# Patient Record
Sex: Female | Born: 1960 | Race: White | Hispanic: No | Smoking: Never smoker
Health system: Southern US, Community
[De-identification: ages and names within clinical notes are randomized; demographics above are authoritative.]

## PROBLEM LIST (undated history)

## (undated) DIAGNOSIS — Z9289 Personal history of other medical treatment: Secondary | ICD-10-CM

## (undated) DIAGNOSIS — Z5189 Encounter for other specified aftercare: Secondary | ICD-10-CM

## (undated) DIAGNOSIS — M1990 Unspecified osteoarthritis, unspecified site: Secondary | ICD-10-CM

## (undated) DIAGNOSIS — K219 Gastro-esophageal reflux disease without esophagitis: Secondary | ICD-10-CM

## (undated) DIAGNOSIS — M549 Dorsalgia, unspecified: Secondary | ICD-10-CM

## (undated) DIAGNOSIS — R05 Cough: Secondary | ICD-10-CM

## (undated) HISTORY — DX: Unspecified osteoarthritis, unspecified site: M19.90

## (undated) HISTORY — DX: Personal history of other medical treatment: Z92.89

## (undated) HISTORY — DX: Gastro-esophageal reflux disease without esophagitis: K21.9

## (undated) HISTORY — DX: Cough: R05

## (undated) HISTORY — DX: Encounter for other specified aftercare: Z51.89

---

## 2001-05-17 ENCOUNTER — Encounter: Admission: RE | Admit: 2001-05-17 | Discharge: 2001-05-17 | Payer: Self-pay | Admitting: Neurological Surgery

## 2001-05-17 ENCOUNTER — Encounter: Payer: Self-pay | Admitting: Neurological Surgery

## 2001-06-04 ENCOUNTER — Ambulatory Visit (HOSPITAL_COMMUNITY): Admission: RE | Admit: 2001-06-04 | Discharge: 2001-06-04 | Payer: Self-pay | Admitting: Neurological Surgery

## 2001-06-04 ENCOUNTER — Encounter: Payer: Self-pay | Admitting: Neurological Surgery

## 2002-06-27 HISTORY — PX: ABDOMINAL HYSTERECTOMY: SHX81

## 2002-09-16 ENCOUNTER — Inpatient Hospital Stay (HOSPITAL_COMMUNITY): Admission: RE | Admit: 2002-09-16 | Discharge: 2002-09-19 | Payer: Self-pay | Admitting: Obstetrics and Gynecology

## 2002-09-24 ENCOUNTER — Ambulatory Visit (HOSPITAL_COMMUNITY): Admission: RE | Admit: 2002-09-24 | Discharge: 2002-09-24 | Payer: Self-pay | Admitting: Obstetrics and Gynecology

## 2002-09-24 ENCOUNTER — Encounter: Payer: Self-pay | Admitting: Obstetrics and Gynecology

## 2002-09-26 ENCOUNTER — Ambulatory Visit (HOSPITAL_COMMUNITY): Admission: RE | Admit: 2002-09-26 | Discharge: 2002-09-26 | Payer: Self-pay | Admitting: Obstetrics and Gynecology

## 2005-06-30 ENCOUNTER — Ambulatory Visit: Payer: Self-pay | Admitting: Family Medicine

## 2006-05-13 ENCOUNTER — Encounter: Admission: RE | Admit: 2006-05-13 | Discharge: 2006-05-13 | Payer: Self-pay | Admitting: Neurological Surgery

## 2007-01-21 ENCOUNTER — Encounter: Admission: RE | Admit: 2007-01-21 | Discharge: 2007-01-21 | Payer: Self-pay | Admitting: Orthopaedic Surgery

## 2007-05-16 ENCOUNTER — Encounter: Admission: RE | Admit: 2007-05-16 | Discharge: 2007-05-16 | Payer: Self-pay | Admitting: Orthopaedic Surgery

## 2007-06-18 ENCOUNTER — Encounter: Admission: RE | Admit: 2007-06-18 | Discharge: 2007-06-18 | Payer: Self-pay | Admitting: Orthopaedic Surgery

## 2007-07-11 ENCOUNTER — Encounter: Admission: RE | Admit: 2007-07-11 | Discharge: 2007-07-11 | Payer: Self-pay | Admitting: Orthopaedic Surgery

## 2007-11-08 ENCOUNTER — Encounter: Admission: RE | Admit: 2007-11-08 | Discharge: 2007-11-08 | Payer: Self-pay | Admitting: Orthopaedic Surgery

## 2007-12-10 ENCOUNTER — Encounter: Admission: RE | Admit: 2007-12-10 | Discharge: 2007-12-10 | Payer: Self-pay | Admitting: Orthopaedic Surgery

## 2008-03-12 ENCOUNTER — Encounter: Admission: RE | Admit: 2008-03-12 | Discharge: 2008-03-12 | Payer: Self-pay | Admitting: Orthopaedic Surgery

## 2008-05-16 ENCOUNTER — Encounter: Admission: RE | Admit: 2008-05-16 | Discharge: 2008-05-16 | Payer: Self-pay | Admitting: Orthopaedic Surgery

## 2008-06-27 HISTORY — PX: BACK SURGERY: SHX140

## 2008-06-27 HISTORY — PX: SPINE SURGERY: SHX786

## 2008-08-07 ENCOUNTER — Encounter: Admission: RE | Admit: 2008-08-07 | Discharge: 2008-08-07 | Payer: Self-pay | Admitting: Orthopaedic Surgery

## 2008-11-12 ENCOUNTER — Encounter: Admission: RE | Admit: 2008-11-12 | Discharge: 2008-11-12 | Payer: Self-pay | Admitting: Orthopaedic Surgery

## 2010-03-15 ENCOUNTER — Encounter: Admission: RE | Admit: 2010-03-15 | Discharge: 2010-03-15 | Payer: Self-pay | Admitting: Orthopaedic Surgery

## 2010-05-23 ENCOUNTER — Emergency Department (HOSPITAL_COMMUNITY): Admission: EM | Admit: 2010-05-23 | Discharge: 2010-05-23 | Payer: Self-pay | Admitting: Emergency Medicine

## 2010-11-12 NOTE — H&P (Signed)
[name], [name]                            ACCOUNT NO.:  [id number]   MEDICAL RECORD NO.:  [id number]                   PATIENT TYPE:  AMB   LOCATION:  DAY                                  FACILITY:  [facility]   PHYSICIAN:  Tilda Burrow, M.D.              DATE OF BIRTH:  April 16, 1961   DATE OF ADMISSION:  DATE OF DISCHARGE:                                HISTORY & PHYSICAL   ADMITTING DIAGNOSES:  1. Dysmenorrhea, right lower quadrant __________.  2. Dyspareunia right lower quadrant.   HISTORY OF PRESENT ILLNESS:  This 50 year old female gravida 3, para 2, AB  1, status post tubal ligation in 1985 with prior history of laparoscopy of  right ovarian cyst in 1983 with subsequent laparoscopy in 1989 for corpus  luteum cyst, aspiration; both occurring on the right side, both performed by  Dr. Kathie Rhodes. [name] prior to my initial contact with the patient.  She is  admitted at this time for a total abdominal hysterectomy and evaluation of  the left tube and ovary.  [name] has been well-known to our practice since  1990.  She had evaluation then for recurrence of a similar sort of right  lower quadrant pain for which Dr. Lottie Dawson had recommended the hysterectomy and  right salpingo-oophorectomy but the patient was psychologically unprepared  for hysterectomy at that time. She underwent a right salpingo-oophorectomy  and appendectomy in 1990 at this facility. This was performed and then she  had a GI workup which was negative by Dr. Karilyn Cota.  Since that time she has  had intermittent GYN care.  Menses were originally lasting 6-8 days total  with only 1 day heavy.  She was managed on birth control pills for a period  of time.   She re-established care in our office in 2003 with complaints of bad cramps  between her periods and discomfort. Evaluation, at that point in time,  showed a mobile anterior uterus.  No masses on the right side. She was  treated with birth control pills to reduce her  periods.   On return to our office 07/10/2002 she was complaining of a similar right  lower quadrant pain which was almost daily in nature, not relieved by Advil.  It was felt mostly in the front with radiation to the back.  The last few  months the pain has been much more significant particularly with her menses.  The patient is very specific that the pain is worse than menses and is very  similar to the aggravating pain that she had back in 1989.  I have been very  clear to [facility], since contact on the uterus seems to reproduce the pain,  that the likelihood that hysterectomy will assist her is significant, but  that absolute reassure can not be given.  Repeat ultrasound has been  performed which shows a uterus measuring 9.7 cm in length x  3.8 x 5.5 cm in  diameter.  The adnexa show normal size left adnexa with right ovary not  visible naturally.   MEDICAL HISTORY:  The patient on disability due to her back discomfort which  she attributes to scoliosis and degenerative joint disease.  She is managed  currently by Dr. __________ for these.  She has a history of scoliosis.   PHYSICAL EXAMINATION:  VITAL SIGNS:  Height 5 feet 7-1/2 inches.  Weight 160-  1/2 .  Blood pressure 125/65.  GENERAL:  A healthy appearing Caucasian female, alert, and oriented x3.  HEENT:  Pupils are equal, round, and reactive.  NECK:  Supple.  Trachea midline.  ABDOMEN:  Discomfort in the right lower quadrant at the end of a  Pfannenstiel-type incision.  PELVIC:  External genitalia is multiparous.  Vaginal exam shows normal  secretions.  Cervix is very elongate sticking well into the vagina and  somewhat tender to manipulation.  The uterus is elongate, easily palpable on  bimanual exam by patient above the symphysis pubis.  Adnexa negative for  masses, but the tenderness of the uterus seems to be the predominate cause  of pain.   LABORATORY DATA:  Urinalysis negative except for microscopic blood.    IMPRESSION:  1. Dysmenorrhea.  2. Dyspareunia, of a chronic nature, not responding to previous right     salpingo-oophorectomy and appendectomy.   PLAN:  Abdominal hysterectomy.  The patient wishes specifically for efforts  to be made to preserve left ovary to avoid hormone therapy.  She has been  very explicit and comments that there is absolutely no pain on the left  side.  She has been made very clear that while the likelihood of improvement  and relief is great, that there is absolutely no guarantee can be given.  Plan abdominal hysterectomy through old Pfannenstiel incision with excision  of cicatrix on 09/16/2002.   ADDENDUM:  Pap smears have most recently shown atypical reactive squamous  cells, but no suspicion of dysplasia.  GC and Chlamydia are negative.                                               Tilda Burrow, M.D.    JVF/MEDQ  D:  09/13/2002  T:  09/13/2002  Job:  [id number]   cc:   Dr. [facility]

## 2010-11-12 NOTE — Op Note (Signed)
NAMESAMAH, LAPIANA                            ACCOUNT NO.:  [id number]   MEDICAL RECORD NO.:  [id number]                   PATIENT TYPE:  AMB   LOCATION:  DAY                                  FACILITY:  [facility]   PHYSICIAN:  Tilda Burrow, M.D.              DATE OF BIRTH:  1960-12-08   DATE OF PROCEDURE:  09/16/2002  DATE OF DISCHARGE:                                 OPERATIVE REPORT   PREOPERATIVE DIAGNOSIS:  Dysmenorrhea, dyspareunia, right lower quadrant  pain.   POSTOPERATIVE DIAGNOSIS:  Dysmenorrhea, dyspareunia, right lower quadrant  pain.   PROCEDURE:  Abdominal hysterectomy.   SURGEON:  Tilda Burrow, M.D.   ASSISTANT:  Amie Critchley, CST and [place], RN   ANESTHESIA:  General   COMPLICATIONS:  None   FINDINGS:  A normal appearing uterus, large cervix 4 cm transverse diameter,  normal appearing left ovary without adhesions, evidence of recent ovulation  on left ovary, well-healed right adnexa with some tension in the area of the  right broad ligament that may have contributed to her persistent right lower  quadrant pain.  Evidence of prior appendectomy.   DETAILS OF PROCEDURE:  The patient was taken to the operating room and  prepped and draped for lower abdominal surgery.  A Pfannenstiel incision was  repeated with wide excision of the skin and cicatrix down to the level of  the fascia which was opened in standard Pfannenstiel technique.  Rectus  muscles were split in the midline, peritoneal cavity entered carefully and  bluntly; and bowel packed away.   Attention was directed to the uterus where the round ligament was clamped,  cut, and suture ligated with #0 chromic and the remnants of the right broad  ligament which were quite dense and tight in the upper aspects of the broad  ligament were clamped, cut, and suture ligated. The left side had easy  identification of the uteroovarian ligaments were isolated, clamped, cut,  and suture ligated.  The uterine  vessels were skeletonized on either side  and then a curved Heaney clamp was used to cross-clamp them and then #0  chromic suture ligature used.   The upper and lower cardinal ligaments were taken down serially in small  bites.  Once we were sure we were below the cervix we made an anterior stab  incision which was approximately 1 cm down from the cervical edge.  The  cervix was then amputated off the of the vaginal cuff and 4 Kocher clamps  used to maintain orientation.  An Aldrich stitch was placed at each lateral  vaginal angle to achieve adequate hemostasis.   A second figure-of-eight suture was required on the left side to improve  hemostasis.  The pelvic support was such that a sagittal closure of the cuff  was not considered desirable because it might be too tense.  Therefore, we  made a modified cuff closure.  After placement of the Aldrich stitches, as  described earlier, we closed the posterior cuff with a figure-of-eight  stitch, as well as the anterior cuff with a figure-of-eight.  The sides of  the cuff were then closed with figure-of-eight sutures on either side,  resulting in a somewhat cross-shaped cuff closure.  A final figure-of-eight  suture in the midline completed closure of the cuff.  Hemostasis was  adequate and support was considered good.   Irrigation of the pelvis was performed and then some point cautery was used  as necessary for individual bleeders.  An addition figure-of-eight suture  was placed on the anterior vaginal cuff to complete hemostasis.  The  peritoneum was reapproximated with 3 interrupted sutures of 2-0 chromic to  the poled peritoneal edges into a closure proximity and then laparotomy  equipment was removed.  Laparotomy tapes were removed and counts correct.  Anterior peritoneum closed using a running 2-0 chromic, the fascial  insertion of the rectus muscles pulled together with interrupted 2-0 chromic  and then the fascia itself was closed  with #0 Vicryl.  The subcuticular  tissues were irrigated with saline solution, confirmed as hemostatic,  reapproximated with 3 interrupted sutures of 2-0 plain and then staple  closure of the skin completed the procedure.  The patient tolerated the  procedure well.   ADDENDUM:  Prior to putting the patient asleep we were very careful to place  the patient in the position that she would be in during the surgery with  legs slightly flexed over pillows due to her history of scoliosis and  chronic back pain. The patient was able to confirm comfort of her position  prior to being put to sleep.                                               Tilda Burrow, M.D.    JVF/MEDQ  D:  09/16/2002  T:  09/16/2002  Job:  [id number]

## 2010-11-12 NOTE — Discharge Summary (Signed)
[name], [name]                            ACCOUNT NO.:  [id number]   MEDICAL RECORD NO.:  [id number]                   PATIENT TYPE:  INP   LOCATION:  A427                                 FACILITY:  [facility]   PHYSICIAN:  Tilda Burrow, M.D.              DATE OF BIRTH:  1960-11-16   DATE OF ADMISSION:  09/16/2002  DATE OF DISCHARGE:  09/19/2002                                 DISCHARGE SUMMARY   PROCEDURE:  Total abdominal hysterectomy which was performed on 09/16/02.   DISCHARGE MEDICATIONS:  1. Motrin 800 mg one p.o. q.8h. x2 weeks.  2. Vicodin 5 30 tablets 1-2 q.4h. p.r.n. severe pain.   FOLLOWUP:  Follow up in six days for staple removal.   HISTORY OF PRESENT ILLNESS:  This 50 year old female gravida 3, para 2, ab 1  status post tubal ligation with history of laparoscopic right ovarian  cystectomy in 1983 with subsequent repeat right ovarian cystectomy in 1989  was admitted at this time for abdominal hysterectomy.  Persistent right  lower quadrant pain has been a continued problem.  She had no history of  endometriosis.  Plans were to preserve the left tube and ovary.   HOSPITAL COURSE:  The patient underwent total abdominal hysterectomy through  the old Pfannenstiel type incision on 09/15/01.  The uterus was quite small.  There was some thickening no acute distress fibrosis of the broad ligament  on the patient's right, but no specific focal abnormalities, and  specifically no endometriosis.  I think it was notable in that there was  tendency to ooze from the cuff, but we offered careful attention until we  were satisfied with the results.  The cuff was closed in a modified cuff  closure which was a cross shaped closure which resulted in adequate pelvic  support without excess tightening.  The ovary on the left was left in place  and attached to the round ligament remnants to keep it out of the pelvis.   The patient tolerated the postoperative course satisfactorily  except that  she had slight increased oozing into the incision.  CBC on day of discharge  showed a white count of 6,500, hemoglobin 11.1, hematocrit 32.6 compared to  preop values of 13.9 and 40.7.  This was more than expected for the 100 cc  postoperative blood loss.  The incision showed both some puffiness and some  bruising suggesting oozing into the incision as well as the one day of extra  slow valve return suggesting some oozing intra-abdominally as well.  The  patient was considered tolerating this well and went home in stable  condition for follow up as six days.  Routine postsurgical instructions  given including no driving x2 weeks, no intimacy x6 weeks or until released  with routine postsurgical instructions reviewed.  Tilda Burrow, M.D.    JVF/MEDQ  D:  09/19/2002  T:  09/19/2002  Job:  [id number]

## 2010-11-12 NOTE — Op Note (Signed)
[name], [name]                            ACCOUNT NO.:  [id number]   MEDICAL RECORD NO.:  [id number]                   PATIENT TYPE:  AMB   LOCATION:  DAY                                  FACILITY:  [facility]   PHYSICIAN:  Tilda Burrow, M.D.              DATE OF BIRTH:  Sep 07, 1960   DATE OF PROCEDURE:  09/26/2002  DATE OF DISCHARGE:                                 OPERATIVE REPORT   PREOPERATIVE DIAGNOSIS:  Postoperative wound hematoma.   POSTOPERATIVE DIAGNOSIS:  Postoperative wound hematoma.   PROCEDURES:  1. Delayed closure of wound hematoma.  2. Placement of subcutaneous flat Jackson-Pratt drain.   SURGEON:  Tilda Burrow, M.D.   ANESTHESIA:  General.   COMPLICATIONS:  None.   FINDINGS:  A healthy-appearing incision bed, with minimal debriding to be  performed.  No evidence of infection.   INDICATIONS:  A 50 year old female 10 days status post hysterectomy  complicated by wound hematoma, opened two days ago in the office with  drainage of a subcutaneous hematoma and CT revealing that there was no  evidence of subfascial or intrapelvic hematoma.  The patient was unable to  tolerate dressing changes in the office sufficiently well to allow closure  in the office and taken to the OR for wound closure after hematoma drainage  resolved.   DESCRIPTION OF PROCEDURE:  The wound dressing was removed and the abdomen  and wound itself prepped and draped after thorough Betadine scrub.  The  patient received IV antibiotics x1 dose prophylaxis.  She had been on oral  Cipro 250 mg b.i.d. x2 days.  The wound bed was inspected carefully and any  area of suspected suboptimal vascularized fatty tissue was trimmed back to a  fresh bed.  There was no major bleeding.  Bovie cautery was not used.  Irrigation in the wound with antibiotic-containing solution, 1 g Ancef in 1  L saline was then performed with then a continuous running two-layer  subcutaneous closure with 2-0 plain with  a flat JP drain placed just above  the fascia.  The JP drain was allowed to exit through a separate stab  incision at the left corner of the incision.  The JP drain was sutured in  place with silk suture.  Staple closure of the skin incision resulted in  good tissue approximation.  The patient was then went to the recovery room  in stable condition.  She will be given an additional seven days of Cipro  and analgesics as needed.                                               Tilda Burrow, M.D.    JVF/MEDQ  D:  09/26/2002  T:  09/27/2002  Job:  [id number]

## 2010-11-12 NOTE — Discharge Summary (Signed)
   [name], [name]                            ACCOUNT NO.:  [id number]   MEDICAL RECORD NO.:  [id number]                   PATIENT TYPE:  INP   LOCATION:  A427                                 FACILITY:  [facility]   PHYSICIAN:  Tilda Burrow, M.D.              DATE OF BIRTH:  1960-09-16   DATE OF ADMISSION:  09/16/2002  DATE OF DISCHARGE:                                 DISCHARGE SUMMARY   ADDITIONAL DIAGNOSES:  Dysmenorrhea, right lower quadrant pain, dyspareunia  primarily right lower quadrant status post right salpingo-oophorectomy.   DISCHARGE DIAGNOSES:  Dysmenorrhea, right lower quadrant pain, dyspareunia  primarily right lower quadrant status post right salpingo-oophorectomy.   PROCEDURE:  Abdominal hysterectomy.   End of dictation                                               Tilda Burrow, M.D.    JVF/MEDQ  D:  09/19/2002  T:  09/19/2002  Job:  [id number]

## 2011-06-29 ENCOUNTER — Encounter: Payer: Self-pay | Admitting: Emergency Medicine

## 2011-06-29 ENCOUNTER — Emergency Department (HOSPITAL_COMMUNITY)
Admission: EM | Admit: 2011-06-29 | Discharge: 2011-06-29 | Disposition: A | Discharge status: Home / Self Care | Payer: Worker's Compensation | Attending: Emergency Medicine | Admitting: Emergency Medicine

## 2011-06-29 DIAGNOSIS — S0101XA Laceration without foreign body of scalp, initial encounter: Secondary | ICD-10-CM

## 2011-06-29 DIAGNOSIS — IMO0002 Reserved for concepts with insufficient information to code with codable children: Secondary | ICD-10-CM | POA: Insufficient documentation

## 2011-06-29 DIAGNOSIS — S0100XA Unspecified open wound of scalp, initial encounter: Secondary | ICD-10-CM | POA: Insufficient documentation

## 2011-06-29 DIAGNOSIS — Y990 Civilian activity done for income or pay: Secondary | ICD-10-CM | POA: Insufficient documentation

## 2011-06-29 DIAGNOSIS — R51 Headache: Secondary | ICD-10-CM | POA: Insufficient documentation

## 2011-06-29 MED ORDER — TETANUS-DIPHTH-ACELL PERTUSSIS 5-2.5-18.5 LF-MCG/0.5 IM SUSP
0.5000 mL | Freq: Once | INTRAMUSCULAR | Status: AC
  Administered 2011-06-29: 0.5 mL via INTRAMUSCULAR
  Filled 2011-06-29: qty 0.5

## 2011-06-29 MED ORDER — IBUPROFEN 800 MG PO TABS
800.0000 mg | ORAL_TABLET | Freq: Once | ORAL | Status: AC
  Administered 2011-06-29: 800 mg via ORAL
  Filled 2011-06-29: qty 1

## 2011-06-29 NOTE — ED Notes (Signed)
Pt hit head on bar at work.  Bleeding controlled.  2 staples placed by mid-level physician.  Pt contacted work about need for UDS here, states they do not require that.

## 2011-06-29 NOTE — ED Notes (Signed)
No new rx given, pt voiced understanding to have staples removed in 1 week.

## 2011-06-29 NOTE — ED Provider Notes (Signed)
History     CSN: [id number]  Arrival date & time 06/29/11  8295   First MD Initiated Contact with Patient 06/29/11 [phone]      Chief Complaint  Patient presents with  . Head Laceration    (Consider location/radiation/quality/duration/timing/severity/associated sxs/prior treatment) HPI  Patient presents to emergency department with chief complaint of head laceration/injury. Patient states she was at work tonight was bent over under a piece of machinery and lifted up hitting the top of her head on the metal lip, cutting the top of her head. Patient denies loss of consciousness. Patient complaining of mild headache. Patient is unsure of her last tetanus shot. Patient denies any anticoagulant use. Denies aggravating or alleviating factors. Bleeding is controlled with pressure dressing applied prior to arrival. Patient did not take anything for pain prior to arrival. Symptoms are acute onset, persistent, and unchanging.  Past Medical History  Diagnosis Date  . Asthma     Past Surgical History  Procedure Date  . Back surgery   . Abdominal hysterectomy     No family history on file.  History  Substance Use Topics  . Smoking status: Never Smoker   . Smokeless tobacco: Not on file  . Alcohol Use: No    OB History    Grav Para Term Preterm Abortions TAB SAB Ect Mult Living                  Review of Systems  All other systems reviewed and are negative.    Allergies  Review of patient's allergies indicates no known allergies.  Home Medications  No current outpatient prescriptions on file.  BP 142/89  Pulse 88  Resp 18  SpO2 97%  Physical Exam  Nursing note and vitals reviewed. Constitutional: She is oriented to person, place, and time. She appears well-developed and well-nourished. No distress.  HENT:  Head: Normocephalic.       1 cm linear laceration on midline scalp. Bleeding well controlled. No underlying hematoma mild tenderness to palpation. No foreign body  seen or palpated.  Eyes: Conjunctivae and EOM are normal. Pupils are equal, round, and reactive to light.  Neck: Normal range of motion. Neck supple.  Cardiovascular: Normal rate.   Pulmonary/Chest: Effort normal.  Abdominal: Soft. Bowel sounds are normal. She exhibits no distension. There is no tenderness. There is no rebound.  Musculoskeletal: Normal range of motion. She exhibits no edema and no tenderness.  Neurological: She is alert and oriented to person, place, and time. No cranial nerve deficit.  Skin: Skin is warm and dry. No rash noted. She is not diaphoretic. No erythema.  Psychiatric: She has a normal mood and affect.    ED Course  Procedures (including critical care time)  PO ibuprofen. IM tetanus  LACERATION REPAIR Performed by: Jenness Corner Authorized by: Jenness Corner Consent: Verbal consent obtained. Risks and benefits: risks, benefits and alternatives were discussed Consent given by: patient Patient identity confirmed: provided demographic data Prepped and Draped in normal sterile fashion Wound explored  Laceration Location: scalp, midline  Laceration Length: 1cm  No Foreign Bodies seen or palpated  Anesthesia: none  Local anesthetic: none  Anesthetic total: 0 ml  Irrigation method:  Amount of cleaning: standard  Skin closure: staples  Number of sutures: 2 staples  Technique: stapling. Hemastatic.  Patient tolerance: Patient tolerated the procedure well with no immediate complications.   Labs Reviewed - No data to display No results found.   1. Scalp laceration  MDM  1 cm linear scalp laceration clean well with no foreign body seen or palpated and closed without difficulty with 2 staples. Patient is alert and oriented with no neuro focal findings. She denies loss of consciousness and neck pain. She is ambulating without difficulty. Her tetanus shot was updated in ER. Patient voices understanding of good wound  care.        Lenon Oms [place], [place] 06/29/11 [phone]

## 2011-06-29 NOTE — ED Provider Notes (Signed)
Medical screening examination/treatment/procedure(s) were performed by non-physician practitioner and as supervising physician I was immediately available for consultation/collaboration.  Doug Sou, MD 06/29/11 [phone]

## 2011-06-29 NOTE — ED Notes (Signed)
PT. PRESENTS WITH LACERATION APPROX. 1/2 " AT TOP SCALP - ACCIDENTALLY HIT HER HEAD AGAINST A GATE AT WORK , NO LOC , BLEEDING CONTROLLED .

## 2012-06-27 HISTORY — PX: SPINE SURGERY: SHX786

## 2012-06-27 HISTORY — PX: BACK SURGERY: SHX140

## 2013-01-04 ENCOUNTER — Other Ambulatory Visit: Payer: Self-pay | Admitting: Orthopaedic Surgery

## 2013-01-04 DIAGNOSIS — T84498A Other mechanical complication of other internal orthopedic devices, implants and grafts, initial encounter: Secondary | ICD-10-CM

## 2013-01-07 ENCOUNTER — Ambulatory Visit
Admission: RE | Admit: 2013-01-07 | Discharge: 2013-01-07 | Disposition: A | Discharge status: Home / Self Care | Payer: 59 | Source: Ambulatory Visit | Attending: Orthopaedic Surgery | Admitting: Orthopaedic Surgery

## 2013-01-07 DIAGNOSIS — T84498A Other mechanical complication of other internal orthopedic devices, implants and grafts, initial encounter: Secondary | ICD-10-CM

## 2013-03-30 ENCOUNTER — Encounter (HOSPITAL_COMMUNITY): Payer: Self-pay | Admitting: *Deleted

## 2013-03-30 ENCOUNTER — Emergency Department (HOSPITAL_COMMUNITY)
Admission: EM | Admit: 2013-03-30 | Discharge: 2013-03-30 | Disposition: A | Discharge status: Home / Self Care | Payer: 59 | Attending: Emergency Medicine | Admitting: Emergency Medicine

## 2013-03-30 DIAGNOSIS — J45909 Unspecified asthma, uncomplicated: Secondary | ICD-10-CM

## 2013-03-30 DIAGNOSIS — Z79899 Other long term (current) drug therapy: Secondary | ICD-10-CM | POA: Insufficient documentation

## 2013-03-30 DIAGNOSIS — J45901 Unspecified asthma with (acute) exacerbation: Secondary | ICD-10-CM | POA: Insufficient documentation

## 2013-03-30 DIAGNOSIS — IMO0002 Reserved for concepts with insufficient information to code with codable children: Secondary | ICD-10-CM | POA: Insufficient documentation

## 2013-03-30 HISTORY — DX: Dorsalgia, unspecified: M54.9

## 2013-03-30 MED ORDER — ALBUTEROL SULFATE (5 MG/ML) 0.5% IN NEBU
5.0000 mg | INHALATION_SOLUTION | Freq: Once | RESPIRATORY_TRACT | Status: AC
  Administered 2013-03-30: 5 mg via RESPIRATORY_TRACT
  Filled 2013-03-30: qty 1

## 2013-03-30 MED ORDER — IPRATROPIUM BROMIDE 0.02 % IN SOLN
0.5000 mg | Freq: Once | RESPIRATORY_TRACT | Status: AC
  Administered 2013-03-30: 0.5 mg via RESPIRATORY_TRACT
  Filled 2013-03-30: qty 2.5

## 2013-03-30 MED ORDER — ALBUTEROL SULFATE (5 MG/ML) 0.5% IN NEBU
2.5000 mg | INHALATION_SOLUTION | Freq: Once | RESPIRATORY_TRACT | Status: AC
  Administered 2013-03-30: 2.5 mg via RESPIRATORY_TRACT
  Filled 2013-03-30: qty 0.5

## 2013-03-30 MED ORDER — PREDNISONE 10 MG PO TABS: ORAL_TABLET | ORAL | Status: DC

## 2013-03-30 MED ORDER — PREDNISONE 20 MG PO TABS: ORAL_TABLET | ORAL | Status: DC

## 2013-03-30 MED ORDER — PREDNISONE 10 MG PO TABS
60.0000 mg | ORAL_TABLET | Freq: Once | ORAL | Status: AC
  Administered 2013-03-30: 60 mg via ORAL
  Filled 2013-03-30 (×2): qty 1

## 2013-03-30 NOTE — ED Notes (Signed)
Cough and SOB began 1.5 week ago.  Saw PMD and given rx for Prednisone and Zithromax.  Symptoms are not resolving.  Hx of asthma.

## 2013-03-30 NOTE — ED Notes (Signed)
Patient ambulated in hall, tolerated well. O2 sat 99%, dropped to 93% during ambulation but returned to 96% while sitting. [name] Triplett aware-in room with patient.

## 2013-04-01 NOTE — ED Provider Notes (Signed)
CSN: [id number]     Arrival date & time 03/30/13  1121 History   First MD Initiated Contact with Patient 03/30/13 1144     Chief Complaint  Patient presents with  . Shortness of Breath  . Cough   (Consider location/radiation/quality/duration/timing/severity/associated sxs/prior Treatment) Patient is a 52 y.o. female presenting with shortness of breath and cough. The history is provided by the patient.  Shortness of Breath Severity:  Moderate Onset quality:  Gradual Duration:  2 weeks Timing:  Constant Progression:  Worsening Chronicity:  New Context: URI   Context: not strong odors   Relieved by:  Nothing Worsened by:  Nothing tried Ineffective treatments:  Inhaler (antibiotic and prednisone) Associated symptoms: cough and wheezing   Associated symptoms: no abdominal pain, no chest pain, no diaphoresis, no fever, no headaches, no hemoptysis, no neck pain, no PND, no rash, no sore throat, no sputum production, no syncope, no swollen glands and no vomiting   Cough:    Cough characteristics:  Productive   Sputum characteristics:  Yellow   Severity:  Mild   Onset quality:  Gradual   Timing:  Intermittent   Progression:  Unchanged   Chronicity:  New Wheezing:    Severity:  Moderate   Onset quality:  Gradual   Timing:  Constant   Progression:  Unchanged   Chronicity:  New Risk factors: no family hx of DVT, no hx of PE/DVT, no prolonged immobilization, no recent surgery and no tobacco use   Risk factors comment:  Hx of asthma Cough Associated symptoms: rhinorrhea, shortness of breath and wheezing   Associated symptoms: no chest pain, no chills, no diaphoresis, no fever, no headaches, no rash and no sore throat     Past Medical History  Diagnosis Date  . Asthma   . Back pain    Past Surgical History  Procedure Laterality Date  . Back surgery    . Abdominal hysterectomy     History reviewed. No pertinent family history. History  Substance Use Topics  . Smoking status:  Never Smoker   . Smokeless tobacco: Not on file  . Alcohol Use: No   OB History   Grav Para Term Preterm Abortions TAB SAB Ect Mult Living                 Review of Systems  Constitutional: Negative for fever, chills, diaphoresis, activity change and appetite change.  HENT: Positive for congestion and rhinorrhea. Negative for sore throat, facial swelling, trouble swallowing, neck pain and neck stiffness.   Eyes: Negative for visual disturbance.  Respiratory: Positive for cough, shortness of breath and wheezing. Negative for hemoptysis, sputum production, chest tightness and stridor.   Cardiovascular: Negative for chest pain, syncope and PND.  Gastrointestinal: Negative for nausea, vomiting and abdominal pain.  Genitourinary: Negative for dysuria and flank pain.  Skin: Negative.  Negative for rash.  Neurological: Negative for dizziness, weakness, numbness and headaches.  Hematological: Negative for adenopathy.  Psychiatric/Behavioral: Negative for confusion.  All other systems reviewed and are negative.    Allergies  Review of patient's allergies indicates no known allergies.  Home Medications   Current Outpatient Rx  Name  Route  Sig  Dispense  Refill  . doxycycline (VIBRA-TABS) 100 MG tablet   Oral   Take 100 mg by mouth 2 (two) times daily.           . fluticasone (FLONASE) 50 MCG/ACT nasal spray   Nasal   Place 2 sprays into the nose 2 (  two) times daily.           . Fluticasone-Salmeterol (ADVAIR) 500-50 MCG/DOSE AEPB   Inhalation   Inhale 1 puff into the lungs every 12 (twelve) hours.           Marland Kitchen loratadine (CLARITIN) 10 MG tablet   Oral   Take 10 mg by mouth every evening.           . metaxalone (SKELAXIN) 800 MG tablet   Oral   Take 800 mg by mouth 3 (three) times daily. arthritis          . methocarbamol (ROBAXIN) 500 MG tablet   Oral   Take 500 mg by mouth 4 (four) times daily. arthritis          . montelukast (SINGULAIR) 10 MG tablet    Oral   Take 10 mg by mouth every morning.           Marland Kitchen morphine (MSIR) 15 MG tablet   Oral   Take 15 mg by mouth every 8 (eight) hours as needed. Back Pain           . predniSONE (DELTASONE) 20 MG tablet      Take 3 tablets po qd x 2 days, then 2 tablets po qd x 2 days, then 1 tablet po qd x 2 days   12 tablet   0   . predniSONE (DELTASONE) 5 MG tablet   Oral   Take 5 mg by mouth 2 (two) times daily.            BP 118/88  Pulse 76  Temp(Src) 98.3 F (36.8 C) (Oral)  Resp 19  Ht 5\' 7"  (1.702 m)  Wt 180 lb (81.647 kg)  BMI 28.19 kg/m2  SpO2 93% Physical Exam  Nursing note and vitals reviewed. Constitutional: She is oriented to person, place, and time. She appears well-developed and well-nourished. No distress.  HENT:  Head: Normocephalic and atraumatic.  Right Ear: Tympanic membrane and ear canal normal.  Left Ear: Tympanic membrane and ear canal normal.  Mouth/Throat: Uvula is midline, oropharynx is clear and moist and mucous membranes are normal. No oropharyngeal exudate.  Eyes: EOM are normal. Pupils are equal, round, and reactive to light.  Neck: Normal range of motion, full passive range of motion without pain and phonation normal. Neck supple.  Cardiovascular: Normal rate, regular rhythm, normal heart sounds and intact distal pulses.   No murmur heard. Pulmonary/Chest: Effort normal. No stridor. No respiratory distress. She has wheezes. She has no rales. Chest wall is not dull to percussion. She exhibits no mass, no tenderness, no crepitus and no retraction.  Coarse lungs sounds bilaterally with inspiratory and expiratory wheezes.    Abdominal: Soft. She exhibits no distension. There is no tenderness. There is no rebound and no guarding.  Musculoskeletal: She exhibits no edema.  Lymphadenopathy:    She has no cervical adenopathy.  Neurological: She is alert and oriented to person, place, and time. She exhibits normal muscle tone. Coordination normal.  Skin:  Skin is warm and dry.    ED Course  Procedures (including critical care time) Labs Review Labs Reviewed - No data to display Imaging Review No results found.  MDM   1. Asthmatic bronchitis    Diffuse inspir and expir wheezes with hx of asthma, no acute respiratory distress.  No tachycardia, hypoxia, or tachypnea.  No concerning sx's for PE.  No hx of fever, shortness of breath or chest pain to indicate need  for imaging at this time  Patient feeling better after prednisone and nebs. Lung sounds improved.  Ambulates in the dept without difficulty or hypoxia.  Will prescribe another prednisone taper and pt advised to continue nebs at home.  Patient is feeling better and request discharge.  Agrees to return if her sx's worsen.  Appears stable for discharge.    Saba Gomm L. Aldrick Derrig, PA-C 04/01/13 1441

## 2013-04-03 NOTE — ED Provider Notes (Signed)
Medical screening examination/treatment/procedure(s) were performed by non-physician practitioner and as supervising physician I was immediately available for consultation/collaboration.   Roney Marion, MD 04/03/13 [phone]

## 2014-08-06 ENCOUNTER — Encounter (HOSPITAL_COMMUNITY): Payer: Self-pay | Admitting: Emergency Medicine

## 2014-08-06 ENCOUNTER — Emergency Department (HOSPITAL_COMMUNITY): Payer: Medicare Other

## 2014-08-06 ENCOUNTER — Emergency Department (HOSPITAL_COMMUNITY)
Admission: EM | Admit: 2014-08-06 | Discharge: 2014-08-06 | Disposition: A | Discharge status: Home / Self Care | Payer: Medicare Other | Attending: Emergency Medicine | Admitting: Emergency Medicine

## 2014-08-06 DIAGNOSIS — Z79899 Other long term (current) drug therapy: Secondary | ICD-10-CM | POA: Insufficient documentation

## 2014-08-06 DIAGNOSIS — Z7951 Long term (current) use of inhaled steroids: Secondary | ICD-10-CM | POA: Diagnosis not present

## 2014-08-06 DIAGNOSIS — M549 Dorsalgia, unspecified: Secondary | ICD-10-CM | POA: Diagnosis not present

## 2014-08-06 DIAGNOSIS — R05 Cough: Secondary | ICD-10-CM | POA: Diagnosis present

## 2014-08-06 DIAGNOSIS — Z791 Long term (current) use of non-steroidal anti-inflammatories (NSAID): Secondary | ICD-10-CM | POA: Diagnosis not present

## 2014-08-06 DIAGNOSIS — J45901 Unspecified asthma with (acute) exacerbation: Secondary | ICD-10-CM | POA: Diagnosis not present

## 2014-08-06 DIAGNOSIS — J40 Bronchitis, not specified as acute or chronic: Secondary | ICD-10-CM

## 2014-08-06 DIAGNOSIS — R0602 Shortness of breath: Secondary | ICD-10-CM

## 2014-08-06 LAB — CBC WITH DIFFERENTIAL/PLATELET
BASOS ABS: 0.1 10*3/uL (ref 0.0–0.1)
Basophils Relative: 1 % (ref 0–1)
Eosinophils Absolute: 1.2 10*3/uL — ABNORMAL HIGH (ref 0.0–0.7)
Eosinophils Relative: 9 % — ABNORMAL HIGH (ref 0–5)
HCT: 39.3 % (ref 36.0–46.0)
Hemoglobin: 12.9 g/dL (ref 12.0–15.0)
Lymphocytes Relative: 12 % (ref 12–46)
Lymphs Abs: 1.5 10*3/uL (ref 0.7–4.0)
MCH: 29.2 pg (ref 26.0–34.0)
MCHC: 32.8 g/dL (ref 30.0–36.0)
MCV: 88.9 fL (ref 78.0–100.0)
MONOS PCT: 6 % (ref 3–12)
Monocytes Absolute: 0.8 10*3/uL (ref 0.1–1.0)
Neutro Abs: 9 10*3/uL — ABNORMAL HIGH (ref 1.7–7.7)
Neutrophils Relative %: 72 % (ref 43–77)
Platelets: 293 10*3/uL (ref 150–400)
RBC: 4.42 MIL/uL (ref 3.87–5.11)
RDW: 14.5 % (ref 11.5–15.5)
WBC: 12.5 10*3/uL — ABNORMAL HIGH (ref 4.0–10.5)

## 2014-08-06 LAB — BASIC METABOLIC PANEL
ANION GAP: 6 (ref 5–15)
BUN: 15 mg/dL (ref 6–23)
CALCIUM: 9 mg/dL (ref 8.4–10.5)
CO2: 25 mmol/L (ref 19–32)
Chloride: 111 mmol/L (ref 96–112)
Creatinine, Ser: 0.76 mg/dL (ref 0.50–1.10)
GFR calc Af Amer: >90 mL/min (ref >90)
Glucose, Bld: 121 mg/dL — ABNORMAL HIGH (ref 70–99)
POTASSIUM: 3.9 mmol/L (ref 3.5–5.1)
SODIUM: 142 mmol/L (ref 135–145)

## 2014-08-06 MED ORDER — IPRATROPIUM-ALBUTEROL 0.5-2.5 (3) MG/3ML IN SOLN
3.0000 mL | Freq: Once | RESPIRATORY_TRACT | Status: AC
  Administered 2014-08-06: 3 mL via RESPIRATORY_TRACT
  Filled 2014-08-06: qty 3

## 2014-08-06 MED ORDER — PREDNISONE 50 MG PO TABS
60.0000 mg | ORAL_TABLET | Freq: Once | ORAL | Status: AC
  Administered 2014-08-06: 60 mg via ORAL
  Filled 2014-08-06 (×2): qty 1

## 2014-08-06 MED ORDER — IOHEXOL 300 MG/ML  SOLN
80.0000 mL | Freq: Once | INTRAMUSCULAR | Status: AC | PRN
  Administered 2014-08-06: 80 mL via INTRAVENOUS

## 2014-08-06 MED ORDER — ALBUTEROL SULFATE (2.5 MG/3ML) 0.083% IN NEBU
2.5000 mg | INHALATION_SOLUTION | Freq: Once | RESPIRATORY_TRACT | Status: AC
  Administered 2014-08-06: 2.5 mg via RESPIRATORY_TRACT
  Filled 2014-08-06: qty 3

## 2014-08-06 MED ORDER — PREDNISONE 10 MG PO TABS: 40.0000 mg | ORAL_TABLET | Freq: Every day | ORAL | Status: DC

## 2014-08-06 MED ORDER — ALBUTEROL SULFATE HFA 108 (90 BASE) MCG/ACT IN AERS: 1.0000 | INHALATION_SPRAY | Freq: Four times a day (QID) | RESPIRATORY_TRACT | Status: DC | PRN

## 2014-08-06 NOTE — ED Notes (Signed)
Resp paged for breathing treatment.  

## 2014-08-06 NOTE — ED Provider Notes (Signed)
CSN: [id number]     Arrival date & time 08/06/14  7829 History  9:24 AM Patient receiving breathing treatment, will return afterwards.  This chart was scribed for Vanetta Mulders, MD by Tonye Royalty, ED Scribe. This patient was seen in room [facility]05/[facility]05 and the patient's care was started at 10:25 AM.     Chief Complaint  Patient presents with  . Cough   Patient is a 54 y.o. female presenting with cough. The history is provided by the patient. No language interpreter was used.  Cough Cough characteristics:  Productive Severity:  Moderate Onset quality:  Gradual Duration: several weeks. Timing:  Constant Progression:  Worsening Chronicity:  New Smoker: no   Relieved by: antibiotics, perdnisone, Albuterol, Mucinex. Worsened by:  Nothing tried Ineffective treatments:  None tried Associated symptoms: chest pain, fever, headaches, rhinorrhea, shortness of breath, sore throat and wheezing   Associated symptoms: no chills and no rash     HPI Comments: [name] is a 54 y.o. female with history of asthma who presents to the Emergency Department complaining of cough and fatigue for the past few weeks. She states she has had courses of antibiotics and prednisone prescribed by her PCP and that her symptoms have improved on medication, but cough never resolved. She states she used prednisone for over 1 month and last used it over 1 month ago. She states she has an appointment with pulmonologist, Dr. Einar Gip, in 2 days. She states she has had pain to her chest and between her shoulder that is worse with cough. She states she feels somewhat improved after breathing treatment and that her breathing has resolved. She states she has been using Albuterol and Mucinex as well.   PCP: Samuel Jester, DO   Past Medical History  Diagnosis Date  . Asthma   . Back pain    Past Surgical History  Procedure Laterality Date  . Back surgery    . Abdominal hysterectomy     History reviewed. No pertinent  family history. History  Substance Use Topics  . Smoking status: Never Smoker   . Smokeless tobacco: Not on file  . Alcohol Use: No   OB History    No data available     Review of Systems  Constitutional: Positive for fever. Negative for chills.  HENT: Positive for rhinorrhea and sore throat.   Eyes: Negative for visual disturbance.  Respiratory: Positive for cough, shortness of breath and wheezing.   Cardiovascular: Positive for chest pain. Negative for leg swelling.  Gastrointestinal: Negative for nausea, vomiting, abdominal pain and diarrhea.  Genitourinary: Negative for dysuria.  Musculoskeletal: Positive for back pain. Negative for neck pain.  Skin: Negative for rash.  Neurological: Positive for headaches.  Hematological: Does not bruise/bleed easily.  Psychiatric/Behavioral: Negative for confusion.      Allergies  Review of patient's allergies indicates no known allergies.  Home Medications   Prior to Admission medications   Medication Sig Start Date End Date Taking? Authorizing Provider  albuterol (PROVENTIL) (2.5 MG/3ML) 0.083% nebulizer solution Take 2.5 mg by nebulization every 6 (six) hours as needed for wheezing or shortness of breath.   Yes Historical Provider, MD  Armodafinil 150 MG tablet Take 150 mg by mouth daily.   Yes Historical Provider, MD  cefdinir (OMNICEF) 300 MG capsule Take 300 mg by mouth 2 (two) times daily. 07/28/14 08/07/14 Yes Historical Provider, MD  DULERA 200-5 MCG/ACT AERO Inhale 2 puffs into the lungs 2 (two) times daily. 07/29/14  Yes Historical Provider,  MD  fluticasone (FLONASE) 50 MCG/ACT nasal spray Place 2 sprays into the nose 2 (two) times daily.     Yes Historical Provider, MD  loratadine (CLARITIN) 10 MG tablet Take 10 mg by mouth daily.    Yes Historical Provider, MD  montelukast (SINGULAIR) 10 MG tablet Take 10 mg by mouth at bedtime.    Yes Historical Provider, MD  morphine (MSIR) 15 MG tablet Take 15 mg by mouth every 8 (eight)  hours as needed. Back Pain     Yes Historical Provider, MD  naproxen (NAPROSYN) 500 MG tablet Take 500 mg by mouth 2 (two) times daily as needed. 07/10/14  Yes Historical Provider, MD  tiZANidine (ZANAFLEX) 4 MG tablet Take 4 mg by mouth 2 (two) times daily as needed.   Yes Historical Provider, MD  zolpidem (AMBIEN) 10 MG tablet Take 10 mg by mouth at bedtime. 07/10/14  Yes Historical Provider, MD  albuterol (PROVENTIL HFA;VENTOLIN HFA) 108 (90 BASE) MCG/ACT inhaler Inhale 1-2 puffs into the lungs every 6 (six) hours as needed for wheezing or shortness of breath. 08/06/14   Vanetta Mulders, MD  predniSONE (DELTASONE) 10 MG tablet Take 4 tablets (40 mg total) by mouth daily. 08/06/14   Vanetta Mulders, MD  predniSONE (DELTASONE) 20 MG tablet Take 3 tablets po qd x 2 days, then 2 tablets po qd x 2 days, then 1 tablet po qd x 2 days Patient not taking: Reported on 08/06/2014 03/30/13   [name] L. Triplett, PA-C   BP 116/74 mmHg  Pulse 94  Temp(Src) 98.5 F (36.9 C) (Oral)  Resp 18  Ht 5\' 7"  (1.702 m)  Wt 180 lb (81.647 kg)  BMI 28.19 kg/m2  SpO2 96% Physical Exam  Constitutional: She is oriented to person, place, and time. She appears well-developed and well-nourished.  HENT:  Head: Normocephalic and atraumatic.  Mucous membranes moist  Eyes: Conjunctivae and EOM are normal.  Sclera clear  Neck: Normal range of motion. Neck supple.  Cardiovascular: Normal rate, regular rhythm and normal heart sounds.   No murmur heard. Pulmonary/Chest: Effort normal. No respiratory distress. She has wheezes (mild bilateral wheezing). She has no rales.  Abdominal: Soft. Bowel sounds are normal. She exhibits no distension. There is no tenderness.  Musculoskeletal: Normal range of motion. She exhibits no edema (no swelling in ankles).  Neurological: She is alert and oriented to person, place, and time. No cranial nerve deficit. She exhibits normal muscle tone. Coordination normal.  Skin: Skin is warm and dry.   Psychiatric: She has a normal mood and affect.  Nursing note and vitals reviewed.   ED Course  Procedures (including critical care time)  DIAGNOSTIC STUDIES: Oxygen Saturation is 98% on room air, normal by my interpretation.    COORDINATION OF CARE: 10:31 AM Discussed treatment plan with patient at beside, the patient agrees with the plan and has no further questions at this time.   Labs Review Labs Reviewed  CBC WITH DIFFERENTIAL/PLATELET - Abnormal; Notable for the following:    WBC 12.5 (*)    Neutro Abs 9.0 (*)    Eosinophils Relative 9 (*)    Eosinophils Absolute 1.2 (*)    All other components within normal limits  BASIC METABOLIC PANEL - Abnormal; Notable for the following:    Glucose, Bld 121 (*)    All other components within normal limits    Imaging Review Dg Chest 2 View  08/06/2014   CLINICAL DATA:  54 year old female with 3 month history of productive  cough  EXAM: CHEST  2 VIEW  COMPARISON:  Prior chest x-ray 12/30/2009 ; prior CT scan of the chest 01/07/2013  FINDINGS: Slight interval progression of patchy airspace opacity in the right upper lung overlying the right clavicle. The cardiac and mediastinal contours are within normal limits. Diffuse bronchitic change and coarsening of the interstitial markings. Combined with mild hyper expansion these findings suggest underlying COPD. No pleural effusion or pneumothorax. No pulmonary edema. Incompletely imaged posterior thoracolumbar fusion hardware.  IMPRESSION: 1. Slight interval progression of patchy opacity in the right lung apex. Differential considerations include progression of underlying pleural parenchymal scarring, or possibly a low grade neoplasm such as adenocarcinoma. Recommend further evaluation with CT scan of the chest. 2. Pulmonary hyperexpansion, central bronchitic change in diffuse coarsening of the interstitial markings suggests underlying COPD.   Electronically Signed   By: Malachy MoanHeath  McCullough M.D.   On:  08/06/2014 09:49   Ct Chest W Contrast  08/06/2014   CLINICAL DATA:  54 year old female with several week history of cough and fatigue despite several courses of antibiotics and steroids. Abnormality noted in the right upper lung on recent chest x-ray.  EXAM: CT CHEST WITH CONTRAST  TECHNIQUE: Multidetector CT imaging of the chest was performed during intravenous contrast administration.  CONTRAST:  80mL OMNIPAQUE IOHEXOL 300 MG/ML  SOLN  COMPARISON:  Chest x-ray obtained earlier today at 9:38 a.m.; prior CT scan of the thoracic spine including portions of the lungs 01/07/2013  FINDINGS: Mediastinum: Unremarkable CT appearance of the thyroid gland. No suspicious mediastinal or hilar adenopathy. No soft tissue mediastinal mass. The thoracic esophagus is unremarkable.  Heart/Vascular: Conventional 3 vessel arch anatomy. No evidence of aneurysm or dissection. No large central PE. Heart within normal limits for size. No pericardial effusion.  Lungs/Pleura: No evidence of pleural effusion. Nonspecific patchy ground-glass attenuation opacity/mosaic attenuation scattered throughout the lungs but primarily in an upper lung predominant distribution. Findings are similar when compared with the visualized portions of the lung on the prior CT scan of the cervical spine.  Bones/Soft Tissues: No acute fracture or aggressive appearing lytic or blastic osseous lesion. Incompletely imaged posterior spine stabilization hardware.  Upper Abdomen: Visualized upper abdominal organs are unremarkable.  IMPRESSION: Nonspecific scattered patchy mosaic attenuation/ground-glass attenuation airspace disease throughout all lobes of both lungs with slight upper lung predominance to the overall pattern. Differential considerations are broad and include post infectious bronchiolitis, toxin inhalation, hypersensitivity pneumonitis, and rheumatic interstitial lung disease as can be seen in the setting of rheumatoid arthritis and Sjogren's  syndrome.  No suspicious focal nodule or mass to suggest malignancy.   Electronically Signed   By: Malachy MoanHeath  McCullough M.D.   On: 08/06/2014 12:43     EKG Interpretation None      MDM   Final diagnoses:  SOB (shortness of breath)  Bronchitis  Asthma, unspecified asthma severity, with acute exacerbation    The patient here with her to be bronchitis exacerbation of asthma. Patient's been struggling now for several weeks. Gets a little better on prednisone and then has  relapse. Her chest x-ray raises concerns for pneumonia or pulmonary mass. So CT scan was done which was negative for both of those. Does show generalized diffuse inflammation of the lungs. This week consistent with her symptoms. Could be due to a viral upper respiratory infection. Could be due to an allergen that's aggravating her lungs. Patient improved here with nebulizers 2 and oral prednisone. Patient will be discharged home on a 5 day course of  prednisone and have her albuterol inhaler renewed. Patient will follow-up with her regular doctor for further evaluation. Did let her know that there could be an allergic reaction as part of the component.  Wheezing almost completely gone by time of discharge. Patient feels well and not to go home.    I personally performed the services described in this documentation, which was scribed in my presence. The recorded information has been reviewed and is accurate.    Vanetta Mulders, MD 08/06/14 1325

## 2014-08-06 NOTE — ED Notes (Signed)
Resp paged for breathing treatemnt.

## 2014-08-06 NOTE — Discharge Instructions (Signed)
CT of the chest without any evidence of pneumonia or any lung mass. Does show evidence of a lot of the lung irritation. Take prednisone as directed for the next 5 days. Usually her albuterol inhaler at least 2 puffs every 6 hours. Make an appointment to follow-up with your regular doctor. Return for any new or worse symptoms.

## 2014-08-06 NOTE — ED Notes (Signed)
Attempted twice to call Resp tech at ext [phone] with no answer.

## 2014-08-06 NOTE — ED Notes (Signed)
Pt reports cough and fatigue for last several weeks. Pt reports has had several courses of abx and prednisone with no relief. Pt also reports recently started new abx and has appointment with pulmonary specialist on Friday. Mild dyspnea noted with exertion. Dry cough noted in triage. Pt reports has been using neb and rescue inhaler at home with no relief.

## 2014-08-08 ENCOUNTER — Ambulatory Visit (INDEPENDENT_AMBULATORY_CARE_PROVIDER_SITE_OTHER): Payer: Medicare Other | Admitting: Internal Medicine

## 2014-08-08 ENCOUNTER — Telehealth: Payer: Self-pay | Admitting: Internal Medicine

## 2014-08-08 ENCOUNTER — Encounter: Payer: Self-pay | Admitting: Internal Medicine

## 2014-08-08 VITALS — BP 150/90 | HR 68 | Ht 67.0 in | Wt 182.0 lb

## 2014-08-08 DIAGNOSIS — J45991 Cough variant asthma: Secondary | ICD-10-CM

## 2014-08-08 DIAGNOSIS — J82 Pulmonary eosinophilia, not elsewhere classified: Secondary | ICD-10-CM

## 2014-08-08 DIAGNOSIS — J8281 Chronic eosinophilic pneumonia: Secondary | ICD-10-CM

## 2014-08-08 MED ORDER — AMOXICILLIN-POT CLAVULANATE 875-125 MG PO TABS: 1.0000 | ORAL_TABLET | Freq: Two times a day (BID) | ORAL | Status: DC

## 2014-08-08 MED ORDER — FAMOTIDINE 20 MG PO TABS: 20.0000 mg | ORAL_TABLET | Freq: Every day | ORAL | Status: DC

## 2014-08-08 MED ORDER — PREDNISONE 10 MG PO TABS: ORAL_TABLET | ORAL | Status: DC

## 2014-08-08 MED ORDER — PANTOPRAZOLE SODIUM 40 MG PO TBEC: 40.0000 mg | DELAYED_RELEASE_TABLET | Freq: Every day | ORAL | Status: DC

## 2014-08-08 NOTE — Patient Instructions (Signed)
After you finish the prednisone from ER Prednisone 10 mg take  4 each am x 2 days,   2 each am x 2 days,  1 each am x 2 days and stop  Augmentin 875 mg take one pill twice daily  X 10 days - take at breakfast and supper with large glass of water.  It would help reduce the usual side effects (diarrhea and yeast infections) if you ate cultured yogurt at lunch.   Work on inhaler technique:  relax and gently blow all the way out then take a nice smooth deep breath back in, triggering the inhaler at same time you start breathing in.  Hold for up to 5 seconds if you can.  Rinse and gargle with water when done     Pantoprazole (protonix) 40 mg   Take 30-60 min before first meal of the day and Pepcid 20 mg one bedtime until return to office - this is the best way to tell whether stomach acid is contributing to your problem.    GERD (REFLUX)  is an extremely common cause of respiratory symptoms just like yours , many times with no obvious heartburn at all.    It can be treated with medication, but also with lifestyle changes including avoidance of late meals, excessive alcohol, smoking cessation, and avoid fatty foods, chocolate, peppermint, colas, red wine, and acidic juices such as orange juice.  NO MINT OR MENTHOL PRODUCTS SO NO COUGH DROPS  USE SUGARLESS CANDY INSTEAD (Jolley ranchers or Stover's or Life Savers) or even ice chips will also do - the key is to swallow to prevent all throat clearing. NO OIL BASED VITAMINS - use powdered substitutes.   Please schedule a follow up office visit in 2 weeks, sooner if needed with all medications in hand

## 2014-08-08 NOTE — Progress Notes (Signed)
Subjective:    Patient ID: [name], female    DOB: 04/13/1961  MRN: [id number]  HPI  [age] yowf never smoker healthy as child held back by scoliosis then had back surgery for scoliosis and may 2010 then indolent onset cough > eval by pulmonary/[facility] > [facility]/allergist  > no allergies except oak so adjusted inhalers but never really better so referred to pulmonary clinic 08/08/14  by Dr Georgiana Shore Butler for cough.     08/08/2014 1st [facility] office visit/ Orestes Geiman   Chief Complaint  Patient presents with  . Pulmonary Consult    Referred by Dr. Samuel Jesterynthia Butler. Pt c/o SOB and cough on and offo since Nov 2015. She states that prednisone is the only thing that has helped. Her cough is prod with thick, clear sputum.    pattern has come and gone x years worse esp since July 2015 some better p prednisone and maint on dulera 200 and singulair then worse again  since Nov  2015 and only partly resp to pred this time (where previously much more responsive)  with sense of persistent day > noct throat / chest congestion, nasal congestion with occ green mucus esp in am and lots of saba use whereas prior to nov 2015  didn't need saba at all, assoc with sore throat and dysphagia but no overt HB  No obvious other patterns in day to day or daytime variabilty or assoc chronic cough or cp or chest tightness, subjective wheeze or overt sinus   symptoms. No unusual exp hx or h/o childhood pna/ asthma or knowledge of premature birth.  Sleeping ok without nocturnal  or early am exacerbation  of respiratory  c/o's or need for noct saba. Also denies any obvious fluctuation of symptoms with weather or environmental changes or other aggravating or alleviating factors except as outlined above   Current Medications, Allergies, Complete Past Medical History, Past Surgical History, Family History, and Social History were reviewed in [facility] electronic medical record.            Review of Systems  Constitutional:  Negative for fever, chills and unexpected weight change.  HENT: Positive for congestion, sore throat and trouble swallowing. Negative for dental problem, ear pain, nosebleeds, postnasal drip, rhinorrhea, sinus pressure, sneezing and voice change.   Eyes: Negative for visual disturbance.  Respiratory: Positive for cough and shortness of breath. Negative for choking.   Cardiovascular: Negative for chest pain and leg swelling.  Gastrointestinal: Negative for vomiting, abdominal pain and diarrhea.  Genitourinary: Negative for difficulty urinating.  Musculoskeletal: Positive for arthralgias.  Skin: Negative for rash.  Neurological: Positive for headaches. Negative for tremors and syncope.  Hematological: Does not bruise/bleed easily.       Objective:   Physical Exam  amb wf nad  Wt Readings from Last 3 Encounters:  08/08/14 182 lb (82.555 kg)  08/06/14 180 lb (81.647 kg)  03/30/13 180 lb (81.647 kg)    Vital signs reviewed  HEENT: nl dentition, turbinates, and orophanx. Nl external ear canals without cough reflex   NECK :  without JVD/Nodes/TM/ nl carotid upstrokes bilaterally   LUNGS: no acc muscle use, clear to A and P bilaterally without cough on insp or exp maneuvers   CV:  RRR  no s3 or murmur or increase in P2, no edema   ABD:  soft and nontender with nl excursion in the supine position. No bruits or organomegaly, bowel sounds nl  MS:  warm without deformities, calf tenderness,  cyanosis or clubbing  SKIN: warm and dry without lesions    NEURO:  alert, approp, no deficits    CTa chest   08/06/14 I personally reviewed images and agree with radiology impression as follows:   Nonspecific scattered patchy mosaic attenuation/ground-glass attenuation airspace disease throughout all lobes of both lungs with slight upper lung predominance to the overall pattern. Differential considerations are broad and include post infectious bronchiolitis, toxin inhalation,  hypersensitivity pneumonitis, and rheumatic interstitial lung disease as can be seen in the setting of rheumatoid arthritis and Sjogren's syndrome.  Note Eos 1.2  same date           Assessment & Plan:

## 2014-08-08 NOTE — Telephone Encounter (Signed)
Called pt and aware RX's sent in. Nothing further needed

## 2014-08-09 ENCOUNTER — Encounter: Payer: Self-pay | Admitting: Internal Medicine

## 2014-08-09 DIAGNOSIS — J45991 Cough variant asthma: Secondary | ICD-10-CM | POA: Insufficient documentation

## 2014-08-09 DIAGNOSIS — J8281 Chronic eosinophilic pneumonia: Secondary | ICD-10-CM | POA: Insufficient documentation

## 2014-08-09 DIAGNOSIS — J82 Pulmonary eosinophilia, not elsewhere classified: Secondary | ICD-10-CM

## 2014-08-09 NOTE — Assessment & Plan Note (Addendum)
See CT chest 08/06/14 but can be plainly seen on cxr also   Agree with ddx by radiology but would also obviously add eos pna to ddx (cxr not typical though for sure)  Will extend prednisone rx another 6 days then regroup in 2 weeks

## 2014-08-09 NOTE — Assessment & Plan Note (Signed)
Longstanding cough may not actually be just related to asthma but since already has dulera 200 will use it for now  The proper method of use, as well as anticipated side effects, of a metered-dose inhaler are discussed and demonstrated to the patient. Improved effectiveness after extensive coaching during this visit to a level of approximately  75%   In meantime, whatever this is chronically very difficult to control and is an airway problem. DDX of  difficult airways management all start with A and  include Adherence, Ace Inhibitors, Acid Reflux, Active Sinus Disease, Alpha 1 Antitripsin deficiency, Anxiety masquerading as Airways dz,  ABPA,  allergy(esp in young), Aspiration (esp in elderly), Adverse effects of DPI,  Active smokers, plus two Bs  = Bronchiectasis and Beta blocker use..and one C= CHF   Adherence is always the initial "prime suspect" and is a multilayered concern that requires a "trust but verify" approach in every patient - starting with knowing how to use medications, especially inhalers, correctly, keeping up with refills and understanding the fundamental difference between maintenance and prns vs those medications only taken for a very short course and then stopped and not refilled.    ? Acid (or non-acid) GERD > always difficult to exclude as up to 75% of pts in some series report no assoc GI/ Heartburn symptoms> rec max (24h)  acid suppression and diet restrictions/ reviewed and instructions given in writing.   ? Active sinus dz > augmentin x 10 days then consider sinus ct if not better  ? [facility] and clearly not active since November but may need to be re-evaluated   ? abpa > will eval at next ov with IgE level off prednisone.    See instructions for specific recommendations which were reviewed directly with the patient who was given a copy with highlighter outlining the key components.

## 2014-08-25 ENCOUNTER — Ambulatory Visit: Payer: Self-pay | Admitting: Internal Medicine

## 2014-09-03 ENCOUNTER — Encounter: Payer: Self-pay | Admitting: Internal Medicine

## 2014-09-03 ENCOUNTER — Ambulatory Visit (INDEPENDENT_AMBULATORY_CARE_PROVIDER_SITE_OTHER)
Admission: RE | Admit: 2014-09-03 | Discharge: 2014-09-03 | Disposition: A | Discharge status: Home / Self Care | Payer: Medicare Other | Source: Ambulatory Visit | Attending: Internal Medicine | Admitting: Internal Medicine

## 2014-09-03 ENCOUNTER — Ambulatory Visit (INDEPENDENT_AMBULATORY_CARE_PROVIDER_SITE_OTHER): Payer: Medicare Other | Admitting: Internal Medicine

## 2014-09-03 VITALS — BP 112/80 | HR 69 | Ht 67.0 in | Wt 186.0 lb

## 2014-09-03 DIAGNOSIS — R059 Cough, unspecified: Secondary | ICD-10-CM | POA: Insufficient documentation

## 2014-09-03 DIAGNOSIS — R05 Cough: Secondary | ICD-10-CM

## 2014-09-03 DIAGNOSIS — J82 Pulmonary eosinophilia, not elsewhere classified: Secondary | ICD-10-CM

## 2014-09-03 DIAGNOSIS — J8281 Chronic eosinophilic pneumonia: Secondary | ICD-10-CM

## 2014-09-03 DIAGNOSIS — J45991 Cough variant asthma: Secondary | ICD-10-CM

## 2014-09-03 HISTORY — DX: Cough, unspecified: R05.9

## 2014-09-03 MED ORDER — MOMETASONE FURO-FORMOTEROL FUM 100-5 MCG/ACT IN AERO
INHALATION_SPRAY | RESPIRATORY_TRACT | Status: DC
Start: 2014-09-03 — End: 2014-11-27

## 2014-09-03 NOTE — Assessment & Plan Note (Signed)
See CT chest 08/06/14 but can be plainly seen on cxr also    Changes are non - specific but improved so no need to change rx at this point and will see if flares again off systemic steroids

## 2014-09-03 NOTE — Assessment & Plan Note (Addendum)
-   08/08/2014  continue dulera 200 2bid   The proper method of use, as well as anticipated side effects, of a metered-dose inhaler are discussed and demonstrated to the patient. Improved effectiveness after extensive coaching during this visit to a level of approximately  90%   She is much better on the dulera 200 but more hoarse today so needs a trial of the dulera 100 2bid to see if gets as much benefit from the lower dose    Each maintenance medication was reviewed in detail including most importantly the difference between maintenance and as needed and under what circumstances the prns are to be used.  Please see instructions for details which were reviewed in writing and the patient given a copy.

## 2014-09-03 NOTE — Progress Notes (Signed)
Subjective:    Patient ID: Michelle FreshwaterCathy W Harting, female    DOB: 05/28/1961  MRN: [id number]    Brief patient profile:  [age] yowf never smoker healthy as child held back by scoliosis then had back surgery for scoliosis and  Then in 2010 indolent onset cough > eval by pulmonary/[facility] > [facility]/allergist  > no allergies except oak so adjusted inhalers and better for up to 3-4 month to pulmonary clinic 08/08/14  by Dr Georgiana Shore Butler for cough onset Nov 2015    History of Present Illness  08/08/2014 1st [facility] office visit/ Trivia Heffelfinger   Chief Complaint  Patient presents with  . Pulmonary Consult    Referred by Dr. Samuel Jesterynthia Butler. Pt c/o SOB and cough on and offo since Nov 2015. She states that prednisone is the only thing that has helped. Her cough is prod with thick, clear sputum.    pattern has come and gone x years worse esp since July 2015 some better p prednisone and maint on dulera 200 and singulair then worse again  since Nov  2015 and only partly resp to pred this time (where previously much more responsive)  with sense of persistent day > noct throat / chest congestion, nasal congestion with occ green mucus esp in am and lots of saba use whereas prior to nov 2015  didn't need saba at all, assoc with sore throat and dysphagia but no overt HB rec dulera 200 2bid After you finish the prednisone from ER Prednisone 10 mg take  4 each am x 2 days,   2 each am x 2 days,  1 each am x 2 days and stop Augmentin 875 mg take one pill twice daily  X 10 days - take at breakfast and supper with large glass of water.  It would help reduce the usual side effects (diarrhea and yeast infections) if you ate cultured yogurt at lunch.  Work on inhaler technique:    Pantoprazole (protonix) 40 mg   Take 30-60 min before first meal of the day and Pepcid 20 mg one bedtime until return to office - this is the best way to tell whether stomach acid is contributing to your problem.   GERD diet    09/03/2014 f/u ov/Devine Klingel re:  asthma / pulmonary infiltrates with eosinophilia assoc with cough flare since Nov 2015  Chief Complaint  Patient presents with  . Follow-up    Pt states that her breathing is much improved. She has not had to use rescue inhaler or neb.      Hoarseness worse on dulera 200 but cough and breathing better  No obvious day to day or daytime variabilty or assoc chronic cough or cp or chest tightness, subjective wheeze overt sinus or hb symptoms. No unusual exp hx or h/o childhood pna/ asthma or knowledge of premature birth.  Sleeping ok without nocturnal  or early am exacerbation  of respiratory  c/o's or need for noct saba. Also denies any obvious fluctuation of symptoms with weather or environmental changes or other aggravating or alleviating factors except as outlined above   Current Medications, Allergies, Complete Past Medical History, Past Surgical History, Family History, and Social History were reviewed in [facility] electronic medical record.  ROS  The following are not active complaints unless bolded sore throat, dysphagia, dental problems, itching, sneezing,  nasal congestion or excess/ purulent secretions, ear ache,   fever, chills, sweats, unintended wt loss, pleuritic or exertional cp, hemoptysis,  orthopnea pnd or leg swelling,  presyncope, palpitations, heartburn, abdominal pain, anorexia, nausea, vomiting, diarrhea  or change in bowel or urinary habits, change in stools or urine, dysuria,hematuria,  rash, arthralgias, visual complaints, headache, numbness weakness or ataxia or problems with walking or coordination,  change in mood/affect or memory.                 Objective:   Physical Exam  amb wf nad but very hoarse   09/03/2014           186  Wt Readings from Last 3 Encounters:  08/08/14 182 lb (82.555 kg)  08/06/14 180 lb (81.647 kg)  03/30/13 180 lb (81.647 kg)    Vital signs reviewed  HEENT: nl dentition, turbinates, and orophanx. Nl external ear canals without  cough reflex   NECK :  without JVD/Nodes/TM/ nl carotid upstrokes bilaterally   LUNGS: no acc muscle use, a few pops and squeaks on insp bilaterally    CV:  RRR  no s3 or murmur or increase in P2, no edema   ABD:  soft and nontender with nl excursion in the supine position. No bruits or organomegaly, bowel sounds nl  MS:  warm without deformities, calf tenderness, cyanosis or clubbing  SKIN: warm and dry without lesions    NEURO:  alert, approp, no deficits    CTa chest   08/06/14 I personally reviewed images and agree with radiology impression as follows:   Nonspecific scattered patchy mosaic attenuation/ground-glass attenuation airspace disease throughout all lobes of both lungs with slight upper lung predominance to the overall pattern. Differential considerations are broad and include post infectious bronchiolitis, toxin inhalation, hypersensitivity pneumonitis, and rheumatic interstitial lung disease as can be seen in the setting of rheumatoid arthritis and Sjogren's syndrome.   Note Eos 1.2  08/06/14   CXR PA and Lateral:   09/03/2014 :     I personally reviewed images and  with radiology impression as follows:    Stable to minimally improved right upper lobe opacity with unchanged interstitial opacities elsewhere, most notable in the right lung Base.  I think overall aeration is much better vs prev study           Assessment & Plan:

## 2014-09-03 NOTE — Assessment & Plan Note (Signed)
-   sinus CT ordered

## 2014-09-03 NOTE — Patient Instructions (Addendum)
Try dulera 100 Take 2 puffs first thing in am and then another 2 puffs about 12 hours later.  Try zyrtec 10 mg at bedtime in place of clariton    Please see patient coordinator before you leave today  to schedule sinus CT    Please remember to go to the  x-ray department downstairs for your tests - we will call you with the results when they are available.     Please schedule a follow up office visit in 6 weeks, call sooner if needed with cxr and pfts on return

## 2014-09-04 ENCOUNTER — Other Ambulatory Visit: Payer: Self-pay

## 2014-09-04 MED ORDER — CETIRIZINE HCL 10 MG PO CAPS: 1.0000 | ORAL_CAPSULE | Freq: Every day | ORAL | Status: DC

## 2014-09-08 ENCOUNTER — Ambulatory Visit (INDEPENDENT_AMBULATORY_CARE_PROVIDER_SITE_OTHER)
Admission: RE | Admit: 2014-09-08 | Discharge: 2014-09-08 | Disposition: A | Discharge status: Home / Self Care | Payer: Medicare Other | Source: Ambulatory Visit | Attending: Internal Medicine | Admitting: Internal Medicine

## 2014-09-08 DIAGNOSIS — R05 Cough: Secondary | ICD-10-CM

## 2014-09-08 DIAGNOSIS — R059 Cough, unspecified: Secondary | ICD-10-CM

## 2014-09-08 NOTE — Progress Notes (Signed)
Quick Note:  Spoke with pt and notified of results per Dr. Wert. Pt verbalized understanding and denied any questions.  ______ 

## 2014-10-07 ENCOUNTER — Encounter: Payer: Self-pay | Admitting: Internal Medicine

## 2014-10-07 ENCOUNTER — Ambulatory Visit (INDEPENDENT_AMBULATORY_CARE_PROVIDER_SITE_OTHER): Payer: Medicare Other | Admitting: Internal Medicine

## 2014-10-07 VITALS — BP 112/76 | HR 65 | Ht 67.0 in | Wt 178.0 lb

## 2014-10-07 DIAGNOSIS — R05 Cough: Secondary | ICD-10-CM

## 2014-10-07 DIAGNOSIS — J45991 Cough variant asthma: Secondary | ICD-10-CM | POA: Diagnosis not present

## 2014-10-07 DIAGNOSIS — R059 Cough, unspecified: Secondary | ICD-10-CM

## 2014-10-07 MED ORDER — PREDNISONE 10 MG PO TABS
ORAL_TABLET | ORAL | Status: DC
Start: 2014-10-07 — End: 2014-10-15

## 2014-10-07 MED ORDER — AMOXICILLIN-POT CLAVULANATE 875-125 MG PO TABS
1.0000 | ORAL_TABLET | Freq: Two times a day (BID) | ORAL | Status: DC
Start: 2014-10-07 — End: 2014-11-27

## 2014-10-07 NOTE — Progress Notes (Signed)
Subjective:    Patient ID: [name], female    DOB: 11/22/1960  MRN: [id number]    Brief patient profile:  [age] yowf never smoker healthy as child held back by scoliosis then had back surgery for scoliosis and  Then in 2010 indolent onset cough > eval by pulmonary/[facility] > [facility]/allergist  > no allergies except oak so adjusted inhalers and better for up to 3-4 months at a time  to pulmonary clinic 08/08/14  by Dr Georgiana Shore Butler for recurrent  cough onset Nov 2015    History of Present Illness  08/08/2014 1st [facility] office visit/ [name]   Chief Complaint  Patient presents with  . Pulmonary Consult    Referred by Dr. Samuel Jesterynthia Butler. Pt c/o SOB and cough on and offo since Nov 2015. She states that prednisone is the only thing that has helped. Her cough is prod with thick, clear sputum.    pattern has come and gone x years worse esp since July 2015 some better p prednisone and maint on dulera 200 and singulair then worse again  since Nov  2015 and only partly resp to pred this time (where previously much more responsive)  with sense of persistent day > noct throat / chest congestion, nasal congestion with occ green mucus esp in am and lots of saba use whereas prior to nov 2015  didn't need saba at all, assoc with sore throat and dysphagia but no overt HB rec dulera 200 2bid After you finish the prednisone from ER Prednisone 10 mg take  4 each am x 2 days,   2 each am x 2 days,  1 each am x 2 days and stop Augmentin 875 mg take one pill twice daily  X 10 days - take at breakfast and supper with large glass of water.  It would help reduce the usual side effects (diarrhea and yeast infections) if you ate cultured yogurt at lunch.  Work on inhaler technique:    Pantoprazole (protonix) 40 mg   Take 30-60 min before first meal of the day and Pepcid 20 mg one bedtime until return to office - this is the best way to tell whether stomach acid is contributing to your problem.   GERD  diet    09/03/2014 f/u ov/[name] re: asthma / pulmonary infiltrates with eosinophilia assoc with cough flare since Nov 2015  Chief Complaint  Patient presents with  . Follow-up    Pt states that her breathing is much improved. She has not had to use rescue inhaler or neb.    Hoarseness worse on dulera 200 but cough and breathing better rec Try dulera 100 Take 2 puffs first thing in am and then another 2 puffs about 12 hours later. Try zyrtec 10 mg at bedtime in place of clariton   Please see patient coordinator before you leave today  to schedule sinus CT > neg acute change   10/07/2014  Acute  ov/[name] re: recurrent cough and sob/ had been doing better on above rx until abuptly worse one week prior to OV    Chief Complaint  Patient presents with  . Acute Visit    Pt c/o increased SOB and cough for the past wk. Cough is prod with "clumpy", green sputum.  She states she has been using albuterol inhaler 2-3 x per day but has not needed neb.   HA worse as day goes on across the front of head radiating to both temples / no better on  clariton.  Last used proair  one day prior to OV , not on day of ov   No obvious day to day or daytime variabilty or assoc   cp or chest tightness, subjective wheeze or overt   hb symptoms. No unusual exp hx or h/o childhood pna/ asthma or knowledge of premature birth.  Sleeping ok without nocturnal  or early am exacerbation  of respiratory  c/o's or need for noct saba. Also denies any obvious fluctuation of symptoms with weather or environmental changes or other aggravating or alleviating factors except as outlined above   Current Medications, Allergies, Complete Past Medical History, Past Surgical History, Family History, and Social History were reviewed in [facility] record.  ROS  The following are not active complaints unless bolded sore throat, dysphagia, dental problems, itching, sneezing,  nasal congestion or excess/ purulent  secretions, ear ache,   fever, chills, sweats, unintended wt loss, pleuritic or exertional cp, hemoptysis,  orthopnea pnd or leg swelling, presyncope, palpitations, heartburn, abdominal pain, anorexia, nausea, vomiting, diarrhea  or change in bowel or urinary habits, change in stools or urine, dysuria,hematuria,  rash, arthralgias, visual complaints, headache, numbness weakness or ataxia or problems with walking or coordination,  change in mood/affect or memory.                 Objective:   Physical Exam  amb wf nad but very hoarse   09/03/2014           186  > 10/07/2014  178  Wt Readings from Last 3 Encounters:  08/08/14 182 lb (82.555 kg)  08/06/14 180 lb (81.647 kg)  03/30/13 180 lb (81.647 kg)    Vital signs reviewed  HEENT: nl dentition, turbinates, and orophanx. Nl external ear canals without cough reflex   NECK :  without JVD/Nodes/TM/ nl carotid upstrokes bilaterally   LUNGS: no acc muscle use, a few pops and squeaks on insp bilaterally with exp wheeze/ poor air movement    CV:  RRR  no s3 or murmur or increase in P2, no edema   ABD:  soft and nontender with nl excursion in the supine position. No bruits or organomegaly, bowel sounds nl  MS:  warm without deformities, calf tenderness, cyanosis or clubbing  SKIN: warm and dry without lesions    NEURO:  alert, approp, no deficits    CTa chest   08/06/14 I personally reviewed images and agree with radiology impression as follows:   Nonspecific scattered patchy mosaic attenuation/ground-glass attenuation airspace disease throughout all lobes of both lungs with slight upper lung predominance to the overall pattern. Differential considerations are broad and include post infectious bronchiolitis, toxin inhalation, hypersensitivity pneumonitis, and rheumatic interstitial lung disease as can be seen in the setting of rheumatoid arthritis and Sjogren's syndrome.   Note Eos 1.2  08/06/14   CXR PA and Lateral:    09/03/2014 :     I personally reviewed images and  with radiology impression as follows:    Stable to minimally improved right upper lobe opacity with unchanged interstitial opacities elsewhere, most notable in the right lung Base.  I think overall aeration is much better vs prev study           Assessment & Plan:

## 2014-10-07 NOTE — Patient Instructions (Addendum)
Add to the  Clariton>>  advil cold sinus as  Needed for stuffy nose or headache   Augmentin 875 mg take one pill twice daily  X 10 days - take at breakfast and supper with large glass of water.  It would help reduce the usual side effects (diarrhea and yeast infections) if you ate cultured yogurt at lunch.   Prednisone 10 mg take  4 each am x 2 days,   2 each am x 2 days,  1 each am x 2 days and stop   Work on inhaler technique:  relax and gently blow all the way out then take a nice smooth deep breath back in, triggering the inhaler at same time you start breathing in.  Hold for up to 5 seconds if you can.  Rinse and gargle with water when done  Our fax number is [phone]    Keep previous appointment - call sooner if needed  Late add Needs allergy profile next ov

## 2014-10-08 ENCOUNTER — Encounter: Payer: Self-pay | Admitting: Internal Medicine

## 2014-10-08 NOTE — Assessment & Plan Note (Signed)
-   sinus CT 09/08/2014> Mild mucosal thickening involves the paranasal sinuses   Doubt present flare related to sinus dz/ doubt ha is from sinuses but ok to try advil cold and sinus

## 2014-10-08 NOTE — Assessment & Plan Note (Addendum)
-   08/08/2014  continue dulera 200 2bid  - 09/03/2014 very hoarse with hfa 90% so rec trial of dulera 100 2bid   Most of her symptoms continue to be more upper than lower so rx with very short course of pred/ augmentin but no change maint rx for now  The proper method of use, as well as anticipated side effects, of a metered-dose inhaler are discussed and demonstrated to the patient. Improved effectiveness after extensive coaching during this visit to a level of approximately  75% so continue dulera 100 2bid    Each maintenance medication was reviewed in detail including most importantly the difference between maintenance and as needed and under what circumstances the prns are to be used.  Please see instructions for details which were reviewed in writing and the patient given a copy.

## 2014-10-15 ENCOUNTER — Ambulatory Visit (INDEPENDENT_AMBULATORY_CARE_PROVIDER_SITE_OTHER): Payer: Medicare Other | Admitting: Internal Medicine

## 2014-10-15 ENCOUNTER — Ambulatory Visit (INDEPENDENT_AMBULATORY_CARE_PROVIDER_SITE_OTHER)
Admission: RE | Admit: 2014-10-15 | Discharge: 2014-10-15 | Disposition: A | Discharge status: Home / Self Care | Payer: Medicare Other | Source: Ambulatory Visit | Attending: Internal Medicine | Admitting: Internal Medicine

## 2014-10-15 ENCOUNTER — Encounter: Payer: Self-pay | Admitting: Internal Medicine

## 2014-10-15 VITALS — BP 134/74 | HR 56 | Temp 97.0°F | Ht 67.0 in | Wt 182.0 lb

## 2014-10-15 DIAGNOSIS — R05 Cough: Secondary | ICD-10-CM

## 2014-10-15 DIAGNOSIS — J82 Pulmonary eosinophilia, not elsewhere classified: Secondary | ICD-10-CM

## 2014-10-15 DIAGNOSIS — J8281 Chronic eosinophilic pneumonia: Secondary | ICD-10-CM

## 2014-10-15 DIAGNOSIS — R059 Cough, unspecified: Secondary | ICD-10-CM

## 2014-10-15 DIAGNOSIS — J45991 Cough variant asthma: Secondary | ICD-10-CM | POA: Diagnosis not present

## 2014-10-15 LAB — PULMONARY FUNCTION TEST
DL/VA % pred: 103 %
DL/VA: 5.42 ml/min/mmHg/L
DLCO UNC % PRED: 84 %
DLCO UNC: 24.69 ml/min/mmHg
FEF 25-75 POST: 2.55 L/s
FEF 25-75 Pre: 1.27 L/sec
FEF2575-%CHANGE-POST: 100 %
FEF2575-%PRED-PRE: 43 %
FEF2575-%Pred-Post: 87 %
FEV1-%CHANGE-POST: 19 %
FEV1-%PRED-POST: 74 %
FEV1-%Pred-Pre: 62 %
FEV1-POST: 2.34 L
FEV1-PRE: 1.96 L
FEV1FVC-%CHANGE-POST: 6 %
FEV1FVC-%PRED-PRE: 90 %
FEV6-%Change-Post: 12 %
FEV6-%Pred-Post: 78 %
FEV6-%Pred-Pre: 70 %
FEV6-POST: 3.04 L
FEV6-Pre: 2.71 L
FEV6FVC-%PRED-POST: 103 %
FEV6FVC-%PRED-PRE: 103 %
FVC-%Change-Post: 12 %
FVC-%PRED-PRE: 68 %
FVC-%Pred-Post: 76 %
FVC-Post: 3.04 L
FVC-Pre: 2.71 L
POST FEV1/FVC RATIO: 77 %
PRE FEV6/FVC RATIO: 100 %
Post FEV6/FVC ratio: 100 %
Pre FEV1/FVC ratio: 72 %
RV % pred: 109 %
RV: 2.2 L
TLC % pred: 88 %
TLC: 4.96 L

## 2014-10-15 MED ORDER — BECLOMETHASONE DIPROPIONATE 80 MCG/ACT IN AERS: 2.0000 | INHALATION_SPRAY | Freq: Two times a day (BID) | RESPIRATORY_TRACT | Status: DC

## 2014-10-15 NOTE — Progress Notes (Signed)
Subjective:    Patient ID: [name], female    DOB: 11/22/1960  MRN: [id number]    Brief patient profile:  [id number] yowf never smoker healthy as child held back by scoliosis then had back surgery for scoliosis and  Then in 2010 indolent onset cough > eval by pulmonary/[name] > [name]/allergist  > no allergies except oak so adjusted inhalers and better for up to 3-4 months at a time  to pulmonary clinic 08/08/14  by [name] for recurrent  cough onset Nov 2015    History of Present Illness  08/08/2014 1st [facility] office visit/ [name]   Chief Complaint  Patient presents with  . Pulmonary Consult    Referred by Michelle. Samuel Jesterynthia Fritz. Pt c/o SOB and cough on and offo since Nov 2015. She states that prednisone is the only thing that has helped. Her cough is prod with thick, clear sputum.    pattern has come and gone x years worse esp since July 2015 some better p prednisone and maint on dulera 200 and singulair then worse again  since Nov  2015 and only partly resp to pred this time (where previously much more responsive)  with sense of persistent day > noct throat / chest congestion, nasal congestion with occ green mucus esp in am and lots of saba use whereas prior to nov 2015  didn't need saba at all, assoc with sore throat and dysphagia but no overt HB rec dulera 200 2bid After you finish the prednisone from ER Prednisone 10 mg take  4 each am x 2 days,   2 each am x 2 days,  1 each am x 2 days and stop Augmentin 875 mg take one pill twice daily  X 10 days - take at breakfast and supper with large glass of water.  It would help reduce the usual side effects (diarrhea and yeast infections) if you ate cultured yogurt at lunch.  Work on inhaler technique:    Pantoprazole (protonix) 40 mg   Take 30-60 min before first meal of the day and Pepcid 20 mg one bedtime until return to office - this is the best way to tell whether stomach acid is contributing to your problem.   GERD  diet    09/03/2014 f/u ov/[name] re: asthma / pulmonary infiltrates with eosinophilia assoc with cough flare since Nov 2015  Chief Complaint  Patient presents with  . Follow-up    Pt states that her breathing is much improved. She has not had to use rescue inhaler or neb.    Hoarseness worse on dulera 200 but cough and breathing better rec Try dulera 100 Take 2 puffs first thing in am and then another 2 puffs about 12 hours later. Try zyrtec 10 mg at bedtime in place of clariton   Please see patient coordinator before you leave today  to schedule sinus CT > neg acute change   10/07/2014  Acute  ov/[name] re: recurrent cough and sob/ had been doing better on above rx until abuptly worse one week prior to OV    Chief Complaint  Patient presents with  . Acute Visit    Pt c/o increased SOB and cough for the past wk. Cough is prod with "clumpy", green sputum.  She states she has been using albuterol inhaler 2-3 x per day but has not needed neb.   HA worse as day goes on across the front of head radiating to both temples / no better on  clariton.  Last used proair  one day prior to OV , not on day of ov  rec Add to the  Clariton>>  advil cold sinus as  Needed for stuffy nose or headache  Augmentin 875 mg take one pill twice daily  X 10 days - take at breakfast and supper with large glass of water.  It would help reduce the usual side effects (diarrhea and yeast infections) if you ate cultured yogurt at lunch.  Prednisone 10 mg take  4 each am x 2 days,   2 each am x 2 days,  1 each am x 2 days and stop  Work on inhaler technique:    Keep previous appointment - call sooner if needed  Late add Needs allergy profile next ov    10/15/2014 f/u ov/[name] re: cough since Nov 2015 / breathing is better to her satisfaction  Chief Complaint  Patient presents with  . Follow-up    Pt states breathing has improved. Still c/o prod cough with clumpy clear mucus, stuffy nose. Denies any SOB, wheezing, chest  congestion/tightness. Uses dulera daily.      No longer needing saba / cough and nasal congestion worse in ams  No obvious day to day or daytime variabilty or assoc sob cp or chest tightness, subjective wheeze or overt   hb symptoms. No unusual exp hx or h/o childhood pna/ asthma or knowledge of premature birth.  Sleeping ok without nocturnal  or early am exacerbation  of respiratory  c/o's or need for noct saba. Also denies any obvious fluctuation of symptoms with weather or environmental changes or other aggravating or alleviating factors except as outlined above   Current Medications, Allergies, Complete Past Medical History, Past Surgical History, Family History, and Social History were reviewed in [facility] electronic medical record.  ROS  The following are not active complaints unless bolded sore throat, dysphagia, dental problems, itching, sneezing,  nasal congestion or excess/ purulent secretions, ear ache,   fever, chills, sweats, unintended wt loss, pleuritic or exertional cp, hemoptysis,  orthopnea pnd or leg swelling, presyncope, palpitations, heartburn, abdominal pain, anorexia, nausea, vomiting, diarrhea  or change in bowel or urinary habits, change in stools or urine, dysuria,hematuria,  rash, arthralgias, visual complaints, headache, numbness weakness or ataxia or problems with walking or coordination,  change in mood/affect or memory.                 Objective:   Physical Exam  amb wf nad / moderately  hoarse   09/03/2014           186  > 10/07/2014  178 > 10/15/14   182  Wt Readings from Last 3 Encounters:  08/08/14 182 lb (82.555 kg)  08/06/14 180 lb (81.647 kg)  03/30/13 180 lb (81.647 kg)    Vital signs reviewed  HEENT: nl dentition, turbinates, and orophanx. Nl external ear canals without cough reflex   NECK :  without JVD/Nodes/TM/ nl carotid upstrokes bilaterally   LUNGS: no acc muscle use, a few pops and squeaks on insp bilaterally    CV:  RRR  no s3  or murmur or increase in P2, no edema   ABD:  soft and nontender with nl excursion in the supine position. No bruits or organomegaly, bowel sounds nl  MS:  warm without deformities, calf tenderness, cyanosis or clubbing  SKIN: warm and dry without lesions    NEURO:  alert, approp, no deficits    CTa chest   08/06/14  I personally reviewed images and agree with radiology impression as follows:   Nonspecific scattered patchy mosaic attenuation/ground-glass attenuation airspace disease throughout all lobes of both lungs with slight upper lung predominance to the overall pattern. Differential considerations are broad and include post infectious bronchiolitis, toxin inhalation, hypersensitivity pneumonitis, and rheumatic interstitial lung disease as can be seen in the setting of rheumatoid arthritis and Sjogren's syndrome.   Note Eos 1.2  08/06/14  CXR PA and Lateral:   10/15/2014 :     I personally reviewed images and agree with radiology impression as follows:   No acute abnormality. Clearing of the infiltrate on the right.         Assessment & Plan:

## 2014-10-15 NOTE — Progress Notes (Signed)
PFT done today. 

## 2014-10-15 NOTE — Patient Instructions (Addendum)
Continue dulera 100 Take 2 puffs first thing in am and then another 2 puffs about 12 hours later.   Add qvar 80 Take 2 puffs first thing in am and then another 2 puffs about 12 hours later.   Please schedule a follow up office visit in 4 weeks, sooner if needed

## 2014-10-16 ENCOUNTER — Telehealth: Payer: Self-pay | Admitting: Internal Medicine

## 2014-10-16 NOTE — Telephone Encounter (Signed)
Patient notified of CXR results. Nothing further needed.  

## 2014-10-16 NOTE — Progress Notes (Signed)
Quick Note:  LMTCB ______ 

## 2014-10-19 ENCOUNTER — Encounter: Payer: Self-pay | Admitting: Internal Medicine

## 2014-10-19 NOTE — Assessment & Plan Note (Addendum)
-   sinus CT 09/08/2014> Mild mucosal thickening involves the paranasal sinuses   Explained nasal congestion and status of sinuses are not related/ reviewed approp use of otc's and blow out the ICS thru the nose (something I've been telling asthmaics with rhinitis for years but now is actually in the dulera instructions!)   See instructions for specific recommendations which were reviewed directly with the patient who was given a copy with highlighter outlining the key components.

## 2014-10-19 NOTE — Assessment & Plan Note (Addendum)
See CT chest 08/06/14 but can be plainly seen on cxr also > resolved on cxr 10/15/14 so doubt it's "eosinophilic pna" as systemic doses used would have been quit small > no further cxr's needed for now

## 2014-10-19 NOTE — Assessment & Plan Note (Signed)
-   08/08/2014  continue dulera 200 2bid  - 09/03/2014 very hoarse with hfa 90% so rec trial of dulera 100 2bid  - PFTS 10/15/14  FEV1 2.34 (74% ) 19% better p saba/ 100% change in fef 25-75 and nl dlco  - 10/15/14 added qvar 80 2bid due to Pos saba resp on dulera 100 2bid  The present pft results and the prev response to steroids/ peripheral eosinophilia strongly suggest eos bronchitis/ cough variant asthma or eos rhinitis so needs tral of qvar 80 2 bid added on to dulera 100 and singulair until next week

## 2014-11-12 ENCOUNTER — Ambulatory Visit: Payer: Self-pay | Admitting: Internal Medicine

## 2014-11-17 ENCOUNTER — Telehealth: Payer: Self-pay | Admitting: Internal Medicine

## 2014-11-17 MED ORDER — BUDESONIDE-FORMOTEROL FUMARATE 160-4.5 MCG/ACT IN AERO: 2.0000 | INHALATION_SPRAY | Freq: Two times a day (BID) | RESPIRATORY_TRACT | Status: DC

## 2014-11-17 NOTE — Telephone Encounter (Signed)
I believe pt missed last appt   Ok to try symbicort 160 sample x 2 weeks and keep appt before runs out to explore alternatives  rx Take 2 puffs first thing in am and then another 2 puffs about 12 hours later.

## 2014-11-17 NOTE — Telephone Encounter (Signed)
Spoke with Vance GatherShelley at [facility], states that Elwin SleightDulera is no longer covered by insurance- no alternative listed.  MW please advise if you have an alternative you'd like to prescribe or if you'd like to initiate a formulary exception.  Thanks!

## 2014-11-17 NOTE — Telephone Encounter (Signed)
Called and spoke to pt. Informed her of the change in rx. Rx sent to preferred pharmacy. Sample left up front for pick up. Pt aware to keep appt with MW on 6/2. Nothing further needed at this time.

## 2014-11-27 ENCOUNTER — Encounter: Payer: Self-pay | Admitting: Internal Medicine

## 2014-11-27 ENCOUNTER — Other Ambulatory Visit (INDEPENDENT_AMBULATORY_CARE_PROVIDER_SITE_OTHER): Payer: Medicare Other

## 2014-11-27 ENCOUNTER — Ambulatory Visit (INDEPENDENT_AMBULATORY_CARE_PROVIDER_SITE_OTHER): Payer: Medicare Other | Admitting: Internal Medicine

## 2014-11-27 VITALS — BP 122/74 | HR 60 | Ht 67.0 in | Wt 177.0 lb

## 2014-11-27 DIAGNOSIS — J45991 Cough variant asthma: Secondary | ICD-10-CM

## 2014-11-27 LAB — CBC WITH DIFFERENTIAL/PLATELET
BASOS PCT: 0.7 % (ref 0.0–3.0)
Basophils Absolute: 0.1 10*3/uL (ref 0.0–0.1)
EOS PCT: 14.2 % — AB (ref 0.0–5.0)
Eosinophils Absolute: 1 10*3/uL — ABNORMAL HIGH (ref 0.0–0.7)
HEMATOCRIT: 40.4 % (ref 36.0–46.0)
Hemoglobin: 13.6 g/dL (ref 12.0–15.0)
LYMPHS ABS: 1.8 10*3/uL (ref 0.7–4.0)
Lymphocytes Relative: 26.7 % (ref 12.0–46.0)
MCHC: 33.5 g/dL (ref 30.0–36.0)
MCV: 84.9 fl (ref 78.0–100.0)
Monocytes Absolute: 0.7 10*3/uL (ref 0.1–1.0)
Monocytes Relative: 9.7 % (ref 3.0–12.0)
NEUTROS ABS: 3.3 10*3/uL (ref 1.4–7.7)
Neutrophils Relative %: 48.7 % (ref 43.0–77.0)
Platelets: 301 10*3/uL (ref 150.0–400.0)
RBC: 4.76 Mil/uL (ref 3.87–5.11)
RDW: 14.1 % (ref 11.5–15.5)
WBC: 6.8 10*3/uL (ref 4.0–10.5)

## 2014-11-27 MED ORDER — PREDNISONE 10 MG PO TABS: ORAL_TABLET | ORAL | Status: DC

## 2014-11-27 NOTE — Progress Notes (Signed)
Subjective:    Patient ID: [name], female    DOB: [birth year]  MRN: [id number]    Brief patient profile:  28 yowf never smoker healthy as child held back by scoliosis then had back surgery for scoliosis and  Then in 2010 indolent onset cough > eval by pulmonary/[place] > Hicks/allergist  > no allergies except oak so adjusted inhalers and better for up to 3-4 months at a time  to pulmonary clinic 08/08/14  by Dr Georgiana Shore for recurrent  cough onset Nov 2015.   History of Present Illness  08/08/2014 1st [facility] office visit/ Wert   Chief Complaint  Patient presents with  . Pulmonary Consult    Referred by Dr. Samuel Jester. Pt c/o SOB and cough on and offo since Nov 2015. She states that prednisone is the only thing that has helped. Her cough is prod with thick, clear sputum.    pattern has come and gone x years worse esp since July 2015 some better p prednisone and maint on dulera 200 and singulair then worse again  since Nov  2015 and only partly resp to pred this time (where previously much more responsive)  with sense of persistent day > noct throat / chest congestion, nasal congestion with occ green mucus esp in am and lots of saba use whereas prior to nov 2015  didn't need saba at all, assoc with sore throat and dysphagia but no overt HB rec dulera 200 2bid After you finish the prednisone from ER Prednisone 10 mg take  4 each am x 2 days,   2 each am x 2 days,  1 each am x 2 days and stop Augmentin 875 mg take one pill twice daily  X 10 days - take at breakfast and supper with large glass of water.  It would help reduce the usual side effects (diarrhea and yeast infections) if you ate cultured yogurt at lunch.  Work on inhaler technique:    Pantoprazole (protonix) 40 mg   Take 30-60 min before first meal of the day and Pepcid 20 mg one bedtime until return to office - this is the best way to tell whether stomach acid is contributing to your problem.   GERD  diet    09/03/2014 f/u ov/Wert re: asthma / pulmonary infiltrates with eosinophilia assoc with cough flare since Nov 2015  Chief Complaint  Patient presents with  . Follow-up    Pt states that her breathing is much improved. She has not had to use rescue inhaler or neb.    Hoarseness worse on dulera 200 but cough and breathing better rec Try dulera 100 Take 2 puffs first thing in am and then another 2 puffs about 12 hours later. Try zyrtec 10 mg at bedtime in place of clariton   Please see patient coordinator before you leave today  to schedule sinus CT > neg acute change   10/07/2014  Acute  ov/Wert re: recurrent cough and sob/ had been doing better on above rx until abuptly worse one week prior to OV    Chief Complaint  Patient presents with  . Acute Visit    Pt c/o increased SOB and cough for the past wk. Cough is prod with "clumpy", green sputum.  She states she has been using albuterol inhaler 2-3 x per day but has not needed neb.   HA worse as day goes on across the front of head radiating to both temples / no better on clariton.  Last used proair  one day prior to OV , not on day of ov  rec Add to the  Clariton>>  advil cold sinus as  Needed for stuffy nose or headache  Augmentin 875 mg take one pill twice daily  X 10 days - take at breakfast and supper with large glass of water.  It would help reduce the usual side effects (diarrhea and yeast infections) if you ate cultured yogurt at lunch.  Prednisone 10 mg take  4 each am x 2 days,   2 each am x 2 days,  1 each am x 2 days and stop  Work on inhaler technique:        10/15/2014 f/u ov/Wert re: cough since Nov 2015 / breathing is better to her satisfaction  Chief Complaint  Patient presents with  . Follow-up    Pt states breathing has improved. Still c/o prod cough with clumpy clear mucus, stuffy nose. Denies any SOB, wheezing, chest congestion/tightness. Uses dulera daily.   rec Continue dulera 100 Take 2 puffs first thing  in am and then another 2 puffs about 12 hours later. > changed to symicort 160 due to insurance Add qvar 80 Take 2 puffs first thing in am and then another 2 puffs about 12 hours later.      11/27/2014 f/u ov/Wert re: atypical asthma with eosinophia  Chief Complaint  Patient presents with  . Follow-up    Pt states that her breathing is unchanged. She is using albuterol inhaler 2 x per wk on average. Had to use neb yesterday due to chest tightness.  only time feels completely better  is after neb or prednisone/ has not seen Dr Willa RoughHicks since Summer of 2015   No obvious day to day or daytime variabilty or assoc sob cp or chest tightness, subjective wheeze or overt   hb symptoms. No unusual exp hx or h/o childhood pna/ asthma or knowledge of premature birth.  Sleeping ok without nocturnal  or early am exacerbation  of respiratory  c/o's or need for noct saba. Also denies any obvious fluctuation of symptoms with weather or environmental changes or other aggravating or alleviating factors except as outlined above   Current Medications, Allergies, Complete Past Medical History, Past Surgical History, Family History, and Social History were reviewed in [facility] electronic medical record.  ROS  The following are not active complaints unless bolded sore throat, dysphagia, dental problems, itching, sneezing,  nasal congestion or excess/ purulent secretions, ear ache,   fever, chills, sweats, unintended wt loss, pleuritic or exertional cp, hemoptysis,  orthopnea pnd or leg swelling, presyncope, palpitations, heartburn, abdominal pain, anorexia, nausea, vomiting, diarrhea  or change in bowel or urinary habits, change in stools or urine, dysuria,hematuria,  rash, arthralgias, visual complaints, headache, numbness weakness or ataxia or problems with walking or coordination,  change in mood/affect or memory.                 Objective:   Physical Exam  amb wf nad / still  moderately  hoarse    09/03/2014           186  > 10/07/2014  178 > 10/15/14   182 > 11/27/2014  177  Wt Readings from Last 3 Encounters:  08/08/14 182 lb (82.555 kg)  08/06/14 180 lb (81.647 kg)  03/30/13 180 lb (81.647 kg)    Vital signs reviewed  HEENT: nl dentition, turbinates, and orophanx. Nl external ear canals without cough reflex   NECK :  without JVD/Nodes/TM/ nl carotid upstrokes bilaterally   LUNGS: no acc muscle use,  squeaks on insp bilaterally, min wheeze     CV:  RRR  no s3 or murmur or increase in P2, no edema   ABD:  soft and nontender with nl excursion in the supine position. No bruits or organomegaly, bowel sounds nl  MS:  warm without deformities, calf tenderness, cyanosis or clubbing  SKIN: warm and dry without lesions    NEURO:  alert, approp, no deficits    CTa chest   08/06/14 I personally reviewed images and agree with radiology impression as follows:   Nonspecific scattered patchy mosaic attenuation/ground-glass attenuation airspace disease throughout all lobes of both lungs with slight upper lung predominance to the overall pattern. Differential considerations are broad and include post infectious bronchiolitis, toxin inhalation, hypersensitivity pneumonitis, and rheumatic interstitial lung disease as can be seen in the setting of rheumatoid arthritis and Sjogren's syndrome.   Note Eos 1.2  08/06/14  CXR PA and Lateral:   10/15/2014 :     I personally reviewed images and agree with radiology impression as follows:   No acute abnormality. Clearing of the infiltrate on the right.         Assessment & Plan:   Outpatient Encounter Prescriptions as of 11/27/2014  Medication Sig  . albuterol (PROVENTIL HFA;VENTOLIN HFA) 108 (90 BASE) MCG/ACT inhaler Inhale 1-2 puffs into the lungs every 6 (six) hours as needed for wheezing or shortness of breath.  Marland Kitchen albuterol (PROVENTIL) (2.5 MG/3ML) 0.083% nebulizer solution Take 2.5 mg by nebulization every 6 (six) hours as needed  for wheezing or shortness of breath.  . Armodafinil 150 MG tablet Take 150 mg by mouth daily.  . beclomethasone (QVAR) 80 MCG/ACT inhaler Inhale 2 puffs into the lungs 2 (two) times daily.  . budesonide-formoterol (SYMBICORT) 160-4.5 MCG/ACT inhaler Inhale 2 puffs into the lungs 2 (two) times daily.  . famotidine (PEPCID) 20 MG tablet Take 1 tablet (20 mg total) by mouth at bedtime.  . fluticasone (FLONASE) 50 MCG/ACT nasal spray Place 2 sprays into the nose 2 (two) times daily.    . montelukast (SINGULAIR) 10 MG tablet Take 10 mg by mouth at bedtime.   Marland Kitchen morphine (MSIR) 15 MG tablet Take 15 mg by mouth every 8 (eight) hours as needed. Back Pain    . naproxen (NAPROSYN) 500 MG tablet Take 500 mg by mouth 2 (two) times daily as needed.  . pantoprazole (PROTONIX) 40 MG tablet Take 1 tablet (40 mg total) by mouth daily.  Marland Kitchen tiZANidine (ZANAFLEX) 4 MG tablet Take 4 mg by mouth 2 (two) times daily as needed.  . zolpidem (AMBIEN) 10 MG tablet Take 10 mg by mouth at bedtime.  . predniSONE (DELTASONE) 10 MG tablet Take  4 each am x 2 days,   2 each am x 2 days,  1 each am x 2 days and stop  . [DISCONTINUED] amoxicillin-clavulanate (AUGMENTIN) 875-125 MG per tablet Take 1 tablet by mouth 2 (two) times daily.  . [DISCONTINUED] mometasone-formoterol (DULERA) 100-5 MCG/ACT AERO Take 2 puffs first thing in am and then another 2 puffs about 12 hours later.   No facility-administered encounter medications on file as of 11/27/2014.

## 2014-11-27 NOTE — Patient Instructions (Signed)
No change in medications   If you start to need nebulizer, go ahead and take Prednisone 10 mg take  4 each am x 2 days,   2 each am x 2 days,  1 each am x 2 days and stop  Please remember to go to the lab department downstairs for your tests - we will call you with the results when they are available.  See Dr Willa RoughHicks within the next month [facility]

## 2014-11-28 LAB — ALLERGY FULL PROFILE
Allergen,Goose feathers, e70: <0.1 kU/L
Alternaria Alternata: <0.1 kU/L
Aspergillus fumigatus, m3: <0.1 kU/L
Bahia Grass: <0.1 kU/L
Bermuda Grass: <0.1 kU/L
Cat Dander: <0.1 kU/L
Common Ragweed: <0.1 kU/L
D. farinae: <0.1 kU/L
Elm IgE: <0.1 kU/L
Fescue: <0.1 kU/L
G009 Red Top: <0.1 kU/L
Goldenrod: <0.1 kU/L
Helminthosporium halodes: <0.1 kU/L
House Dust Hollister: <0.1 kU/L
IGE (IMMUNOGLOBULIN E), SERUM: 41 kU/L (ref <115)
Lamb's Quarters: <0.1 kU/L
Plantain: 0.1 kU/L — ABNORMAL HIGH
Stemphylium Botryosum: <0.1 kU/L

## 2014-11-28 NOTE — Progress Notes (Signed)
Quick Note:  Spoke with pt and notified of results per Dr. Wert. Pt verbalized understanding and denied any questions.  ______ 

## 2014-11-30 ENCOUNTER — Encounter: Payer: Self-pay | Admitting: Internal Medicine

## 2014-12-23 ENCOUNTER — Encounter: Payer: Self-pay | Admitting: *Deleted

## 2014-12-23 ENCOUNTER — Emergency Department
Admission: EM | Admit: 2014-12-23 | Discharge: 2014-12-23 | Disposition: A | Discharge status: Home / Self Care | Payer: No Typology Code available for payment source | Attending: Emergency Medicine | Admitting: Emergency Medicine

## 2014-12-23 ENCOUNTER — Emergency Department: Payer: No Typology Code available for payment source

## 2014-12-23 DIAGNOSIS — Y92410 Unspecified street and highway as the place of occurrence of the external cause: Secondary | ICD-10-CM | POA: Insufficient documentation

## 2014-12-23 DIAGNOSIS — Y9389 Activity, other specified: Secondary | ICD-10-CM | POA: Diagnosis not present

## 2014-12-23 DIAGNOSIS — G8929 Other chronic pain: Secondary | ICD-10-CM | POA: Diagnosis not present

## 2014-12-23 DIAGNOSIS — Y998 Other external cause status: Secondary | ICD-10-CM | POA: Diagnosis not present

## 2014-12-23 DIAGNOSIS — Z7951 Long term (current) use of inhaled steroids: Secondary | ICD-10-CM | POA: Diagnosis not present

## 2014-12-23 DIAGNOSIS — Z79891 Long term (current) use of opiate analgesic: Secondary | ICD-10-CM | POA: Diagnosis not present

## 2014-12-23 DIAGNOSIS — S3992XA Unspecified injury of lower back, initial encounter: Secondary | ICD-10-CM | POA: Diagnosis present

## 2014-12-23 DIAGNOSIS — Z79899 Other long term (current) drug therapy: Secondary | ICD-10-CM | POA: Insufficient documentation

## 2014-12-23 DIAGNOSIS — S39012A Strain of muscle, fascia and tendon of lower back, initial encounter: Secondary | ICD-10-CM | POA: Insufficient documentation

## 2014-12-23 MED ORDER — HYDROMORPHONE HCL 1 MG/ML IJ SOLN
1.0000 mg | Freq: Once | INTRAMUSCULAR | Status: AC
  Administered 2014-12-23: 1 mg via INTRAMUSCULAR

## 2014-12-23 MED ORDER — HYDROMORPHONE HCL 1 MG/ML IJ SOLN
INTRAMUSCULAR | Status: AC
  Administered 2014-12-23: 1 mg via INTRAMUSCULAR
  Filled 2014-12-23: qty 1

## 2014-12-23 MED ORDER — KETOROLAC TROMETHAMINE 60 MG/2ML IM SOLN
INTRAMUSCULAR | Status: AC
  Administered 2014-12-23: 60 mg via INTRAMUSCULAR
  Filled 2014-12-23: qty 2

## 2014-12-23 MED ORDER — KETOROLAC TROMETHAMINE 60 MG/2ML IM SOLN
60.0000 mg | Freq: Once | INTRAMUSCULAR | Status: AC
  Administered 2014-12-23: 60 mg via INTRAMUSCULAR

## 2014-12-23 NOTE — Discharge Instructions (Signed)
Cryotherapy Cryotherapy is when you put ice on your injury. Ice helps lessen pain and puffiness (swelling) after an injury. Ice works the best when you start using it in the first 24 to 48 hours after an injury. HOME CARE  Put a dry or damp towel between the ice pack and your skin.  You may press gently on the ice pack.  Leave the ice on for no more than 10 to 20 minutes at a time.  Check your skin after 5 minutes to make sure your skin is okay.  Rest at least 20 minutes between ice pack uses.  Stop using ice when your skin loses feeling (numbness).  Do not use ice on someone who cannot tell you when it hurts. This includes small children and people with memory problems (dementia). GET HELP RIGHT AWAY IF:  You have white spots on your skin.  Your skin turns blue or pale.  Your skin feels waxy or hard.  Your puffiness gets worse. MAKE SURE YOU:   Understand these instructions.  Will watch your condition.  Will get help right away if you are not doing well or get worse. Document Released: 11/30/2007 Document Revised: 09/05/2011 Document Reviewed: 02/03/2011 Exit[employer] Patient Information 2015 [employer]. This information is not intended to replace advice given to you by your health care provider. Make sure you discuss any questions you have with your health care provider.  

## 2014-12-23 NOTE — ED Notes (Signed)
Patient transported to X-ray 
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
## 2014-12-25 ENCOUNTER — Telehealth: Payer: Self-pay | Admitting: Emergency Medicine

## 2015-01-20 ENCOUNTER — Other Ambulatory Visit: Payer: Self-pay | Admitting: Orthopaedic Surgery

## 2015-01-20 DIAGNOSIS — R52 Pain, unspecified: Secondary | ICD-10-CM

## 2015-01-21 ENCOUNTER — Ambulatory Visit
Admission: RE | Admit: 2015-01-21 | Discharge: 2015-01-21 | Disposition: A | Discharge status: Home / Self Care | Payer: Medicare Other | Source: Ambulatory Visit | Attending: Orthopaedic Surgery | Admitting: Orthopaedic Surgery

## 2015-01-21 DIAGNOSIS — R52 Pain, unspecified: Secondary | ICD-10-CM

## 2015-03-17 ENCOUNTER — Other Ambulatory Visit: Payer: Self-pay | Admitting: Internal Medicine

## 2015-04-15 DIAGNOSIS — G8929 Other chronic pain: Secondary | ICD-10-CM | POA: Insufficient documentation

## 2015-04-15 DIAGNOSIS — M545 Low back pain: Secondary | ICD-10-CM

## 2015-06-14 IMAGING — CT CT PARANASAL SINUSES LIMITED
1 of 2 series · 16 of 19 positions shown, 20 images · non-contrast
Comparison: CT head 05/23/2010

CLINICAL DATA: Productive cough since [REDACTED]. Intermittent sinus
drainage and pressure.

EXAM:
CT PARANASAL SINUS LIMITED WITHOUT CONTRAST
TECHNIQUE: Non-contiguous multidetector CT images of the paranasal sinuses were
obtained in a single plane without contrast.

[Series 5: ltd sinus 3.0 h30s · axial · 0.32mm/px · z∈[-102,-7]mm · 16 of 18 slices shown, 20 images]
[im 2/18  brain]
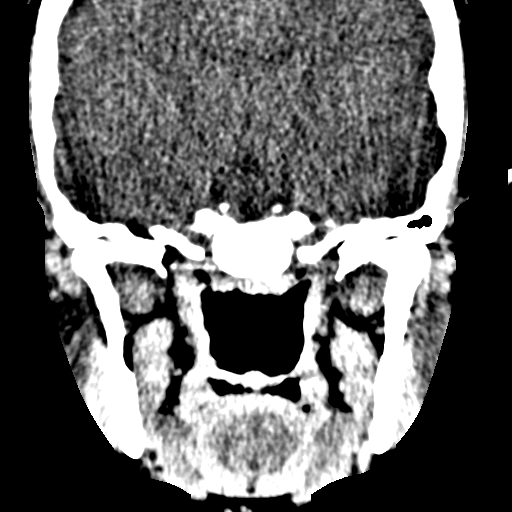
[im 2/18  bone]
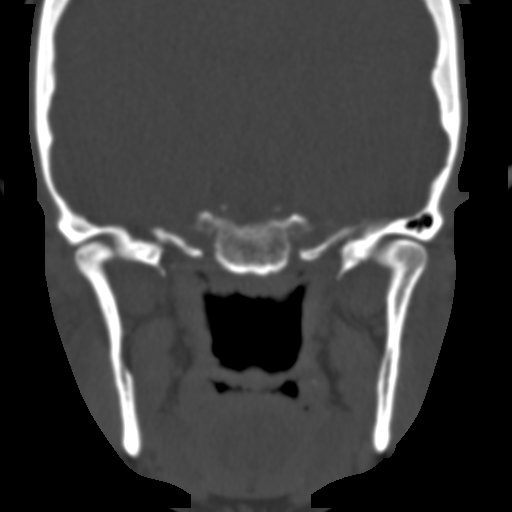
[im 3/18  bone]
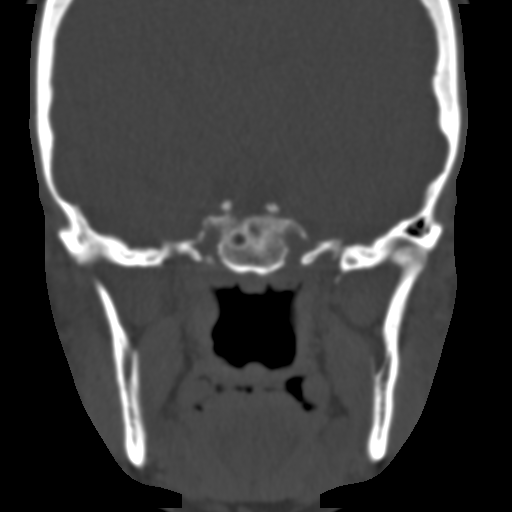
[im 4/18  bone]
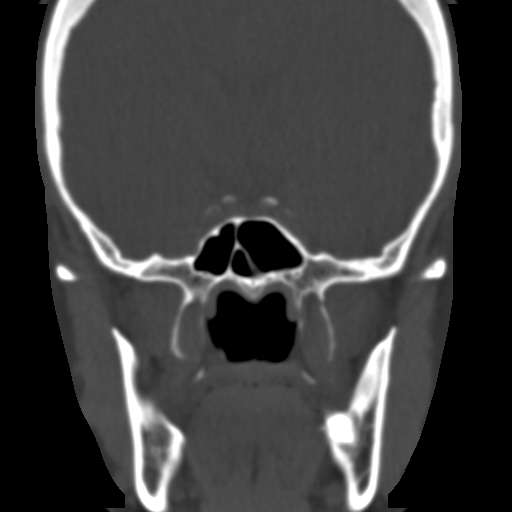
[im 5/18  bone]
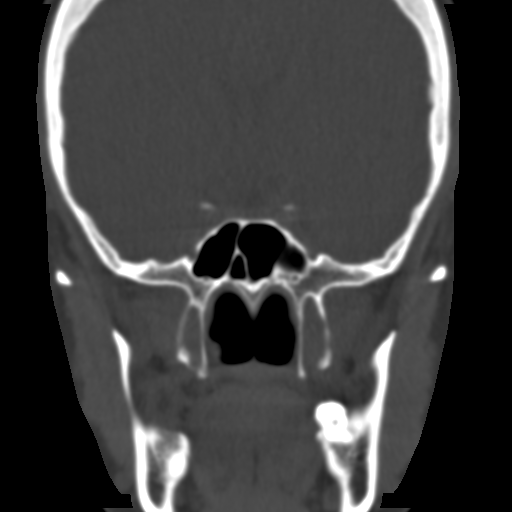
[im 6/18  brain]
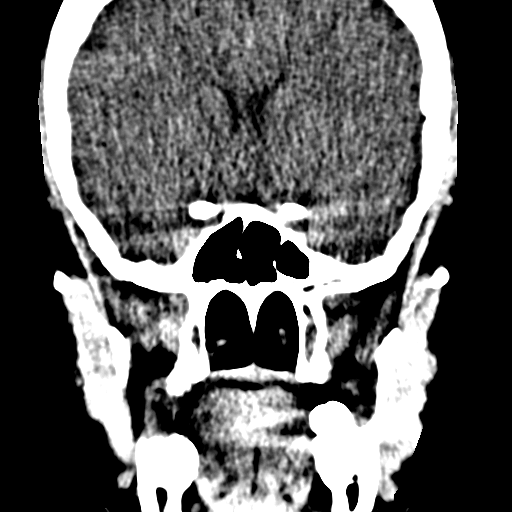
[im 6/18  bone]
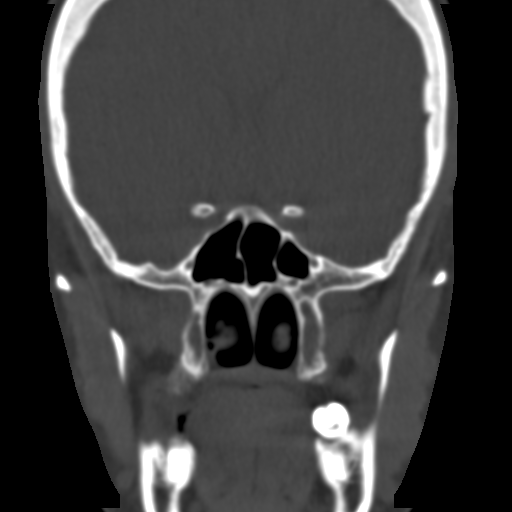
[im 7/18  bone]
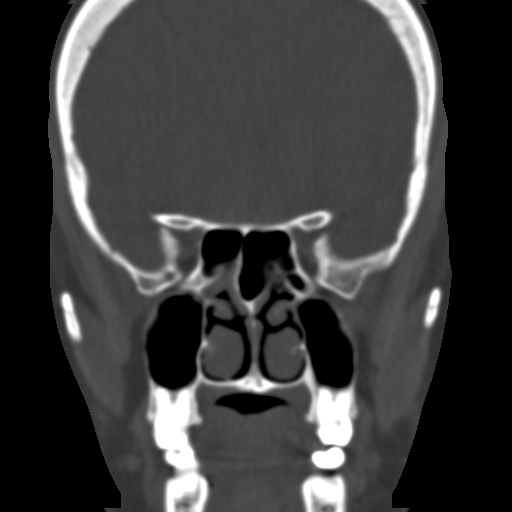
[im 8/18  bone]
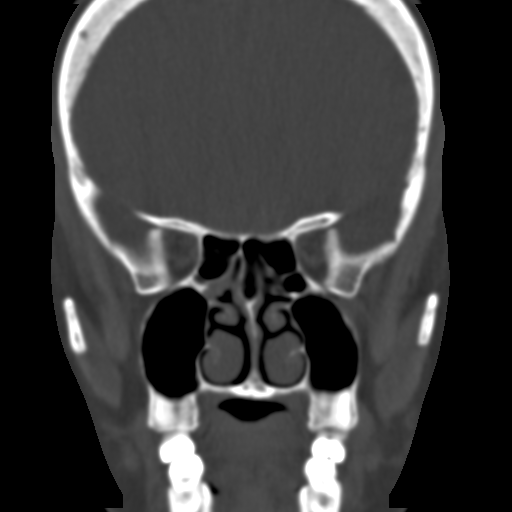
[im 9/18  bone]
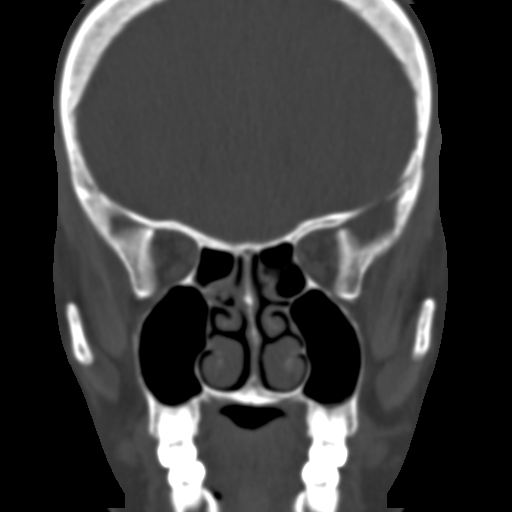
[im 10/18  brain]
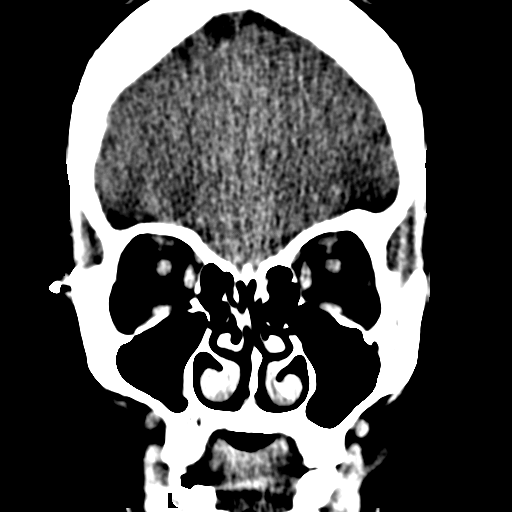
[im 10/18  bone]
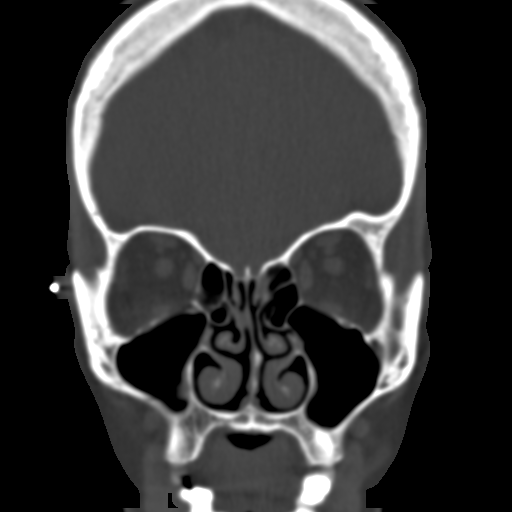
[im 11/18  bone]
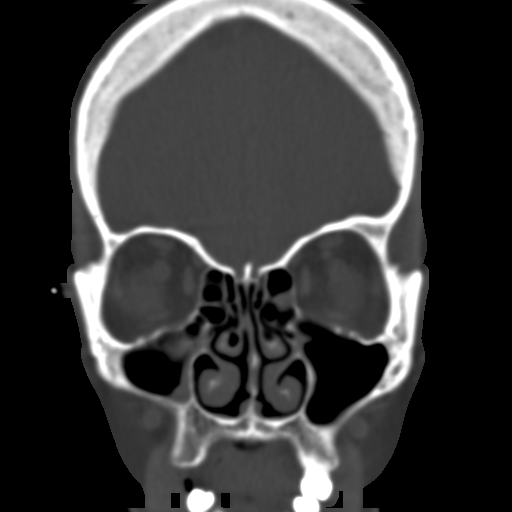
[im 12/18  bone]
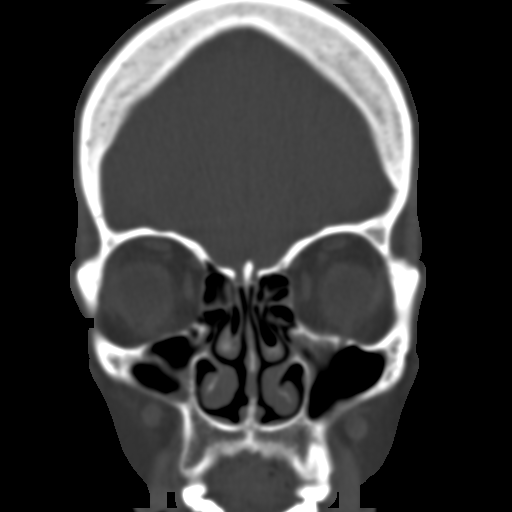
[im 13/18  bone]
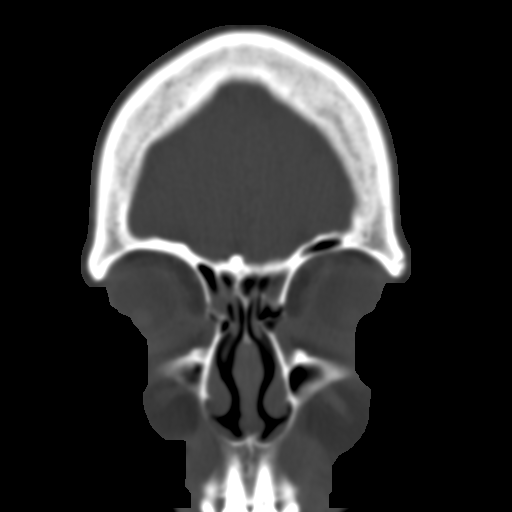
[im 14/18  brain]
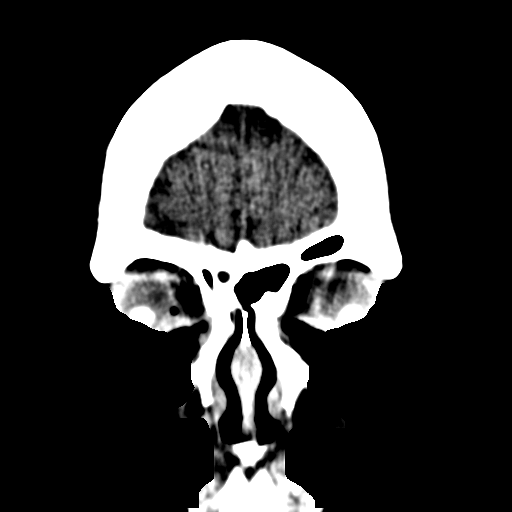
[im 14/18  bone]
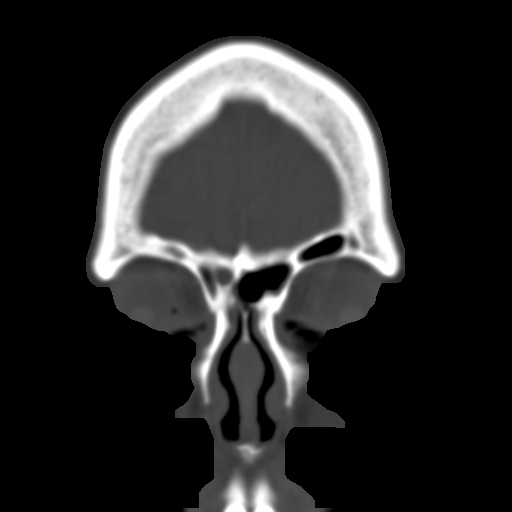
[im 15/18  bone]
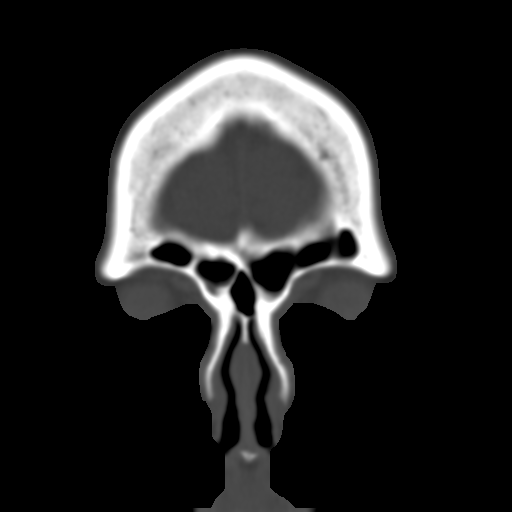
[im 16/18  bone]
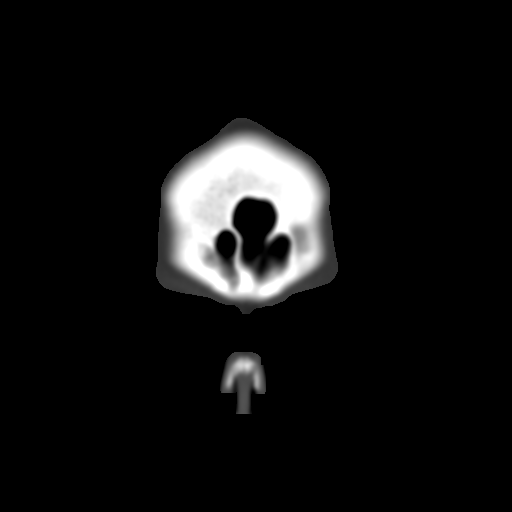
[im 17/18  bone]
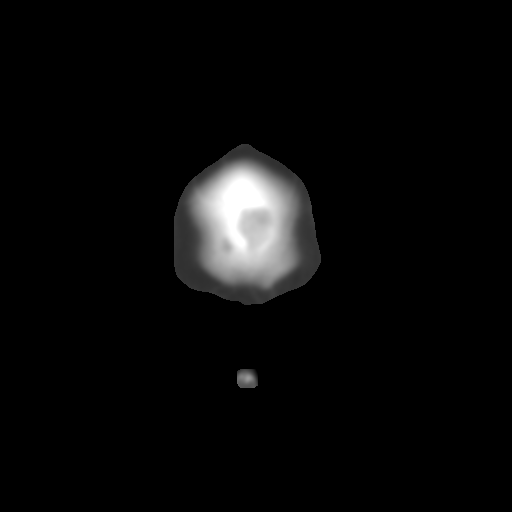

[16 of 19 positions shown; findings below may reference images not displayed]

FINDINGS: There is mild mucosal thickening involving the maxillary sinuses,
frontal sinus and ethmoid air cells. No air-fluid levels identified.
Mild leftward deviation of the nasal septum.
IMPRESSION: 1. Mild mucosal thickening involves the paranasal sinuses.

## 2015-06-28 HISTORY — PX: KNEE ARTHROSCOPY: SUR90

## 2015-07-09 DIAGNOSIS — M1710 Unilateral primary osteoarthritis, unspecified knee: Secondary | ICD-10-CM | POA: Insufficient documentation

## 2015-07-09 DIAGNOSIS — M179 Osteoarthritis of knee, unspecified: Secondary | ICD-10-CM | POA: Insufficient documentation

## 2015-07-09 DIAGNOSIS — M8700 Idiopathic aseptic necrosis of unspecified bone: Secondary | ICD-10-CM | POA: Insufficient documentation

## 2015-08-26 ENCOUNTER — Other Ambulatory Visit: Payer: Self-pay | Admitting: Internal Medicine

## 2015-08-26 ENCOUNTER — Other Ambulatory Visit: Payer: Self-pay | Admitting: Neurology

## 2015-08-26 MED ORDER — ALBUTEROL SULFATE HFA 108 (90 BASE) MCG/ACT IN AERS: 1.0000 | INHALATION_SPRAY | Freq: Four times a day (QID) | RESPIRATORY_TRACT | Status: DC | PRN

## 2015-09-01 ENCOUNTER — Encounter: Payer: Self-pay | Admitting: Allergy and Immunology

## 2015-09-01 ENCOUNTER — Ambulatory Visit (INDEPENDENT_AMBULATORY_CARE_PROVIDER_SITE_OTHER): Payer: Medicare Other | Admitting: Allergy and Immunology

## 2015-09-01 ENCOUNTER — Other Ambulatory Visit: Payer: Self-pay

## 2015-09-01 VITALS — BP 120/80 | HR 88 | Temp 97.6°F | Resp 20

## 2015-09-01 DIAGNOSIS — J310 Chronic rhinitis: Secondary | ICD-10-CM | POA: Diagnosis not present

## 2015-09-01 DIAGNOSIS — J4541 Moderate persistent asthma with (acute) exacerbation: Secondary | ICD-10-CM | POA: Diagnosis not present

## 2015-09-01 MED ORDER — BUDESONIDE-FORMOTEROL FUMARATE 160-4.5 MCG/ACT IN AERO: INHALATION_SPRAY | RESPIRATORY_TRACT | Status: DC

## 2015-09-01 MED ORDER — ALBUTEROL SULFATE (2.5 MG/3ML) 0.083% IN NEBU: INHALATION_SOLUTION | RESPIRATORY_TRACT | Status: DC

## 2015-09-01 NOTE — Progress Notes (Signed)
FOLLOW UP NOTE  RE: [name] MRN: [id number] DOB: 01/27/1961 [facility] [address] [place], [place] [address] Date of Office Visit: 09/01/2015  Subjective:  [name] is a 55 y.o. female who presents today for Cough  Assessment:   1. Moderate persistent asthma, with acute exacerbation, responsive to bronchodilator, afebrile in no respiratory distress.   2. Mixed rhinitis.   3.      Incomplete follow-up. Plan:   Meds ordered this encounter  Medications  . budesonide-formoterol (SYMBICORT) 160-4.5 MCG/ACT inhaler    Sig: Use 2 puffs twice daily to prevent cough or wheeze.  Rinse, gargle, and spit after use.    Dispense:  10.2 g    Refill:  2    **Place on hold until patient requests refill**  . albuterol (PROVENTIL) (2.5 MG/3ML) 0.083% nebulizer solution    Sig: Nebulize one ampule every 4 hours as needed for cough or wheeze.    Dispense:  75 mL    Refill:  1   Patient Instructions  1.  Prednisone  in office and  daily for 4 days. 2.  Saline nasal was 2-4 times daily. 3.  Continue Symbicort 2 puffs twice daily. 4.  Flonase 1-2 sprays each nostril once each morning. 5.  Continue Singulair  each evening. 6.  Albuterol neb every 4 hours as needed (or ProAir). 7.  Follow-up in 3 weeks or sooner if needed--for further evaluation, possible additional management.   HPI: [name] returns to the office with cough, intermittently over the last 3 weeks.  [name] was last seen November 2015 by Dr. Nunzio Cobbs and has not followed up as discussed.  Since then her maintenance medications have changed to Symbicort as Elwin Sleight was not covered by insurance.  With the fluctuant weather patterns, [name] notices cough, congestion, runny nose, postnasal drip, sneezing and now wheezing.  If [name] is very active, [name] notices shortness of breath without chest congestion or tightness.  [name] denies muscle aches, headache, fever, GI upset or sore throat but has been adding  Mucinex or Robitussin and albuterol HFA or neb 3 times daily.  [name] reports an ED visit November 2016 for respiratory symptoms, for which completed prednisone.  No hospitalizations.  Denies urgent care visits, or antibiotic courses. Reports sleep is normal.  [name] apparently saw Dr. Sherene Sires in June 2016.  [name] has a current medication list which includes the following prescription(s): albuterol, albuterol, armodafinil, black cohosh, estradiol, fluticasone, lidocaine, montelukast, morphine, naproxen, pantoprazole, symbicort, tizanidine, zolpidem.   Drug Allergies: No Known Allergies  Objective:   Filed Vitals:   09/01/15 1151  BP: 120/80  Pulse: 88  Temp: 97.6 F (36.4 C)  Resp: 20   SpO2 Readings from Last 1 Encounters:  09/01/15 95%   Physical Exam  Constitutional: [name] is well-developed, well-nourished, and in no distress.  HENT:  Head: Atraumatic.  Right Ear: Tympanic membrane and ear canal normal.  Left Ear: Tympanic membrane and ear canal normal.  Nose: Mucosal edema present. No rhinorrhea. No epistaxis.  Mouth/Throat: Oropharynx is clear and moist and mucous membranes are normal. No oropharyngeal exudate, posterior oropharyngeal edema or posterior oropharyngeal erythema.  Neck: Neck supple.  Cardiovascular: Normal rate, S1 normal and S2 normal.   No murmur heard. Pulmonary/Chest: Effort normal. [name] has wheezes. [name] has no rhonchi. [name] has no rales.  Post Xopenex/Atrovent neb:  Improved aeration rare end expiratory wheeze without rhonchi or crackles.  Lymphadenopathy:    [name] has  no cervical adenopathy.   Diagnostics: Spirometry:   FVC 1.80--55%, FEV1 1.27--45%, post bronchodilator improvement FVC  2.85--87%, FEV1 1.57---56%.    Roselyn M. Willa RoughHicks, MD  cc: Samuel JesterYNTHIA BUTLER, DO

## 2015-09-01 NOTE — Patient Instructions (Addendum)
   Prednisone 40mg  in office and 30mg  daily for 4 days.  Saline nasal was 2-4 times daily.  Continue Symbicort 2 puffs twice daily.  Flonase 1-2 sprays each nostril once each morning.  Continue Singulair 10mg  each evening.  Albuterol neb every 4 hours as needed (or ProAir).  Follow-up in 3 weeks or sooner if needed.

## 2015-09-07 NOTE — Addendum Note (Signed)
Addended by: Lake BellsEAGUE, Deema Juncaj J on: 09/07/2015 02:06 PM   Modules accepted: Orders

## 2015-09-10 ENCOUNTER — Other Ambulatory Visit: Payer: Self-pay

## 2015-09-10 MED ORDER — ALBUTEROL SULFATE (2.5 MG/3ML) 0.083% IN NEBU: INHALATION_SOLUTION | RESPIRATORY_TRACT | Status: DC

## 2015-09-22 ENCOUNTER — Encounter: Payer: Self-pay | Admitting: Allergy and Immunology

## 2015-09-22 ENCOUNTER — Ambulatory Visit (INDEPENDENT_AMBULATORY_CARE_PROVIDER_SITE_OTHER): Payer: Medicare Other | Admitting: Allergy and Immunology

## 2015-09-22 VITALS — BP 118/78 | HR 64 | Temp 98.0°F | Resp 16

## 2015-09-22 DIAGNOSIS — J4540 Moderate persistent asthma, uncomplicated: Secondary | ICD-10-CM

## 2015-09-22 DIAGNOSIS — J310 Chronic rhinitis: Secondary | ICD-10-CM

## 2015-09-22 NOTE — Patient Instructions (Signed)
   Call Orthopedic surgeon's office today.  Continue current medication regime.  Follow-up in 6 months or sooner if needed.

## 2015-09-22 NOTE — Progress Notes (Signed)
     FOLLOW UP NOTE  RE: [name] MRN: [id number] DOB: 10/31/1960 [facility] [name]. [address] [place], [place] [id number] Date of Office Visit: 09/22/2015  Subjective:  [name] is a 55 y.o. female who presents today for Asthma  Assessment:   1. Mixed rhinitis   2. Moderate persistent asthma, improved from recent exacerbation, s/p prednisone.    3.      Upcoming knee procedure. Plan:   Patient Instructions  1.  [name] will call Orthopedic surgeon's office today--and make them aware of her recent asthma flare and prednisone course and our office has last a message as well. 2.  Continue current medication regime--- Symbicort, Flonase, Singulair daily and as needed albuterol. 3.  Follow-up in 6 months or sooner if needed.  HPI: [name] returns to the office in follow-up of asthma exacerbation and mixed rhinitis, now completed prednisone course and restart of maintenance medications, with last visit on March 7th.  She reports a significant improvement,  no albuterol in greater than 10 days with normal sleep and activity nearly 95% improved per her report today.  She denies any congestion, runny nose, sneezing, postnasal drip, headache, sore throat or fever and rarely any cough without wheeze, difficulty breathing, shortness of breath or other concerns.  She does state there is knee procedure scheduled for tomorrow, though she has not communicated with anyone at the Orthopedic office recently regarding her recent asthma flare or prednisone course.  She does not remember communicating with anyone from anesthesia from the Surgery Center either.  No new medical issues.    [name] has a current medication list which includes the following prescription(s): albuterol neb, albuterol, armodafinil, black cohosh, budesonide-formoterol, estradiol, fluticasone, lidocaine, montelukast, morphine, naproxen, pantoprazole, tizanidine, zolpidem, beclomethasone, famotidine, and prednisone.     Drug Allergies: No Known Allergies  Objective:   Filed Vitals:   09/22/15 1109  BP: 118/78  Pulse: 64  Temp: 98 F (36.7 C)  Resp: 16   SpO2 Readings from Last 1 Encounters:  09/22/15 96%   Physical Exam  Constitutional: She is well-developed, well-nourished, and in no distress.  HENT:  Head: Atraumatic.  Right Ear: Tympanic membrane and ear canal normal.  Left Ear: Tympanic membrane and ear canal normal.  Nose: Mucosal edema present. No rhinorrhea. No epistaxis.  Mouth/Throat: Oropharynx is clear and moist and mucous membranes are normal. No oropharyngeal exudate, posterior oropharyngeal edema or posterior oropharyngeal erythema.  Neck: Neck supple.  Cardiovascular: Normal rate, S1 normal and S2 normal.   No murmur heard. Pulmonary/Chest: Effort normal. She has no wheezes. She has no rhonchi. She has no rales.  Lymphadenopathy:    She has no cervical adenopathy.   Diagnostics: Spirometry:  FVC  3.06--93%, FEV1 2.30--82%.    Michelle M. Willa RoughHicks, MD  cc: Michelle JesterYNTHIA BUTLER, DO

## 2015-10-09 NOTE — Addendum Note (Signed)
Addended by: Clifton JamesLARK, HEATHER L on: 10/09/2015 12:26 PM   Modules accepted: Orders

## 2015-12-17 ENCOUNTER — Encounter: Payer: Self-pay | Admitting: Allergy and Immunology

## 2015-12-17 ENCOUNTER — Ambulatory Visit (INDEPENDENT_AMBULATORY_CARE_PROVIDER_SITE_OTHER): Payer: Medicare Other | Admitting: Allergy and Immunology

## 2015-12-17 VITALS — BP 118/66 | HR 88 | Temp 97.8°F | Resp 16

## 2015-12-17 DIAGNOSIS — J310 Chronic rhinitis: Secondary | ICD-10-CM

## 2015-12-17 DIAGNOSIS — J4540 Moderate persistent asthma, uncomplicated: Secondary | ICD-10-CM

## 2015-12-17 NOTE — Patient Instructions (Addendum)
   Use Symbicort 160mcg 2 puffs twice daily, rinse, gargle and spit with water after use.  Prednisone 30mg  today.  Continue Singulair 10mg  each evening.  Ventolin 2 puffs every 4 hours as needed.  Use Rhinocort AQ 1-2 sprays each nostril once daily.  Saline nasal wash each evening and as needed.   Follow-up in 4-6 months or sooner if needed.

## 2015-12-17 NOTE — Progress Notes (Signed)
     FOLLOW UP NOTE  RE: [name] MRN: [id number] DOB: 12/31/1960 [facility] [address] [place] [id number] Date of Office Visit: 12/17/2015  Subjective:  Michelle FreshwaterCathy W Fritz is a 55 y.o. female who presents today for Cough  Assessment:   1. Moderate persistent asthma, intermittent cough--responsive to bronchodilator in no respiratory distress.   2. Mixed rhinitis, intermittent symptoms, afebrile.   3.      Incomplete medication adherence. Plan:  1.  Use Symbicort 160mcg 2 puffs twice daily, rinse, gargle and spit with water after use. 2.  Prednisone 30mg  today. 3.  Continue Singulair 10mg  each evening. 4.  Ventolin 2 puffs every 4 hours as needed--call with any recurring need. 5.  Use Rhinocort AQ 1-2 sprays each nostril once daily--stop Flonase. 6.  Saline nasal wash each evening and as needed.  7.  Follow-up in 4-6 months or sooner if needed.   HPI: [name] returns to the office with cough over the last week.  Since her last visit in March, she had her knee procedure without difficulty and feels she has done fairly well only using her Symbicort one puff a day.  Describes intermittent nasal congestion, postnasal drip, without fever, headache, sore throat, discolored drainage, difficulty breathing or shortness of breath this week as well.  She had been using albuterol a couple times a day when her voice seemed froggy, but was concerned about persisting symptoms, as she needs to help a friend recuperate, who is currently hospitalized and she wanted to be 100% well.  Denies ED or urgent care visits, prednisone or antibiotic courses. Reports sleep (no cough or disruption) and activity are normal.  She has not definitively been around anyone with respiratory illness, but she has stayed several nights at the hospital  [name] has a current medication list which includes the following prescription(s): albuterol neb, albuterol, armodafinil, black cohosh,  budesonide-formoterol, estradiol, famotidine, fluticasone, lidocaine, montelukast, morphine, naproxen, tizanidine, topiramate, zolpidem.    Drug Allergies: No Known Allergies  Objective:   Filed Vitals:   12/17/15 1116  BP: 118/66  Pulse: 88  Temp: 97.8 F (36.6 C)  Resp: 16   SpO2 Readings from Last 1 Encounters:  12/17/15 96%   Physical Exam  Constitutional: She is well-developed, well-nourished, and in no distress.  Non-toxic appearance.  HENT:  Head: Atraumatic.  Right Ear: Tympanic membrane and ear canal normal.  Left Ear: Tympanic membrane and ear canal normal.  Nose: Mucosal edema present. No rhinorrhea. No epistaxis.  Mouth/Throat: Oropharynx is clear and moist and mucous membranes are normal. No oropharyngeal exudate, posterior oropharyngeal edema or posterior oropharyngeal erythema.  Neck: Neck supple.  Cardiovascular: Normal rate, S1 normal and S2 normal.   No murmur heard. Pulmonary/Chest: Effort normal. No respiratory distress. She has wheezes (scattered end exipiratory with good aeration.). She has no rhonchi. She has no rales.  Post Xopenex/Atrovent:  Clear to auscultation without wheeze, rhonchi or crackles symmetric excellent aeration and patient reports improved.  Lymphadenopathy:    She has no cervical adenopathy.   Diagnostics: Spirometry:  FVC 2.85----82%, FEV1  2.28--81% and FEF 25-75 % 2.06---71%; Significant postbronchodilator improvement  FVC 3.00--87%, FEV1 2.56--91%  and FEF 25-75 % 2.86--99%.  (See scanned image).    [name] M. Willa RoughHicks, MD  cc: Samuel JesterYNTHIA BUTLER, DO

## 2016-03-14 DIAGNOSIS — G43709 Chronic migraine without aura, not intractable, without status migrainosus: Secondary | ICD-10-CM | POA: Insufficient documentation

## 2016-03-29 ENCOUNTER — Encounter: Payer: Self-pay | Admitting: Allergy & Immunology

## 2016-03-29 ENCOUNTER — Ambulatory Visit (INDEPENDENT_AMBULATORY_CARE_PROVIDER_SITE_OTHER): Payer: Medicare Other | Admitting: Allergy & Immunology

## 2016-03-29 VITALS — BP 110/70 | HR 61 | Temp 97.8°F | Resp 16

## 2016-03-29 DIAGNOSIS — Z659 Problem related to unspecified psychosocial circumstances: Secondary | ICD-10-CM | POA: Diagnosis not present

## 2016-03-29 DIAGNOSIS — D721 Eosinophilia, unspecified: Secondary | ICD-10-CM

## 2016-03-29 DIAGNOSIS — J310 Chronic rhinitis: Secondary | ICD-10-CM | POA: Insufficient documentation

## 2016-03-29 DIAGNOSIS — J4540 Moderate persistent asthma, uncomplicated: Secondary | ICD-10-CM | POA: Diagnosis not present

## 2016-03-29 NOTE — Patient Instructions (Addendum)
1. Moderate persistent asthma, uncomplicated - Continue on Symbicort 160/4.5 two puffs twice daily. - Try to use your spacer, but otherwise use the three finger technique.  - Feel free to decrease to two puffs once daily if you are doing well.  - Continue with Singulair 10mg  daily. - Call us at the end of the week if you are not getting better.   2. Mixed rhinitis - Continue with Flonase 1-2 sprays per nostril daily. - Continue with nasal saline rinses.   3. Return in about 6 months (around 09/27/2016).  Please inform us of any Emergency Department visits, hospitalizations, or changes in symptoms. Call us before going to the ED for breathing or allergy symptoms since we might be able to fit you in for a sick visit. Feel free to contact us anytime with any questions, problems, or concerns.  It was a pleasure to meet you today!   Websites that have reliable patient information: 1. American Academy of Asthma, Allergy, and Immunology: www.aaaai.org 2. Food Allergy Research and Education (FARE): foodallergy.org 3. Mothers of Asthmatics: http://www.asthmacommunitynetwork.org 4. American College of Allergy, Asthma, and Immunology: www.acaai.org

## 2016-03-29 NOTE — Progress Notes (Signed)
FOLLOW UP  Date of Service/Encounter:  03/29/16   Assessment:   Moderate persistent asthma, uncomplicated - Plan: Spirometry with Graph  Mixed rhinitis  Other social stressor  Eosinophilia   Asthma Reportables:  Severity: moderate persistent  Risk: low Control: well controlled  Seasonal Influenza Vaccine: no but encouraged   Plan/Recommendations:   1. Moderate persistent asthma, uncomplicated - Continue on Symbicort 160/4.5 two puffs twice daily. - Try to use your spacer, but otherwise use the three finger technique.  - Feel free to decrease to two puffs once daily if you are doing well.  - Continue with Singulair 10mg  daily. - Call us at the end of the week if you are not getting better.  - She does have a history of eosinophilia and would be a candidate for Nucala or Cinqair if her symptoms worsen.  - Will order CBC to see where her eosinophil level is now.   2. Mixed rhinitis - Continue with Flonase 1-2 sprays per nostril daily. - Continue with nasal saline rinses.   3. Return in about 6 months (around 09/27/2016).   Subjective:   [name] is a 55 y.o. female presenting today for follow up of  Chief Complaint  Patient presents with  . Asthma  .  [name] has a history of the following: Patient Active Problem List   Diagnosis Date Noted  . Eosinophilia 03/29/2016  . Moderate persistent asthma, uncomplicated 03/29/2016  . 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 Other social stressor 03/29/2016  . Aseptic necrosis of bone (HCC) 07/09/2015  . Arthritis of knee, degenerative 07/09/2015  . Chronic LBP 04/15/2015  . Cough 09/03/2014  . Cough variant asthma 08/09/2014  . Pulmonary infiltrates with eosinophilia (HCC) 08/09/2014    History obtained from: chart review and patient.  [name] was referred by Michelle Jester, DO.     [name] is a 55 y.o. female presenting for a follow up visit for asthma and allergies. [name] was last seen in June 2017 by Dr.  Willa Fritz, who has since left the practice. At that time, she was continued on Symbicort 160/4.5 two puffs BID as well as Singulair. She was given one dose of prednisone in the clinic due to an asthma exacerbation. She was continued with Rhinocort 1-2 sprays each nostril once daily in lieu of Flonase. She does not use a spacer routinely. Last environmental allergy testing was performed in June 2016 and was notable only to plantain (serum specific IgE testing). Interestingly, at that time, she did have eosinophilia of 965 in June 2016. She had skin testing performed in April 2013 that was positive to mold mix 2-4, cat, dog, and cockroach.   Since the last visit, she has done well. [name]'s asthma has been well controlled. She has not required rescue medication, experienced nocturnal awakenings due to lower respiratory symptoms, nor have activities of daily living been limited. She has been coughing over the last couple of weeks, otherwise no nocturnal symptoms. She does endorse nasal congestion which has been ongoing since this past Saturday. She is continuing to use nasal saline rinses. She denies sinus pressure and fever.   She has had multiple social stressors since the last visit. Her boyfriend was diagnosed with colon cancer and has been in out of the hospital over the summer. It was diagnosed via colon rupture in May/June 2016. His course has been complicated by collapsed lungs as well.   Otherwise, there have been no changes to the past medical history,  surgical history, family history, or social history. She is currently disabled secondary to back problems. She has had several surgeries including rod placements in her back. She has tried going back to work, but has broken rods on a couple of occasions when she tries to go back to work. She is currently working part time at a [employer] station two nights per week.     Review of Systems: a 14-point review of systems is pertinent for what is mentioned in  HPI.  Otherwise, all other systems were negative. Constitutional: negative other than that listed in the HPI Eyes: negative other than that listed in the HPI Ears, nose, mouth, throat, and face: negative other than that listed in the HPI Respiratory: negative other than that listed in the HPI Cardiovascular: negative other than that listed in the HPI Gastrointestinal: negative other than that listed in the HPI Genitourinary: negative other than that listed in the HPI Integument: negative other than that listed in the HPI Hematologic: negative other than that listed in the HPI Musculoskeletal: negative other than that listed in the HPI Neurological: negative other than that listed in the HPI Allergy/Immunologic: negative other than that listed in the HPI    Objective:   Blood pressure 110/70, pulse 61, temperature 97.8 F (36.6 C), temperature source Oral, resp. rate 16, SpO2 99 %. There is no height or weight on file to calculate BMI.   Physical Exam:  General: Alert, interactive, in no acute distress. Very pleasant and good natured female. HEENT: TMs pearly gray, turbinates edematous and pale with clear discharge, post-pharynx erythematous. Posterior cobblestoning present. Neck: Supple without thyromegaly. Lungs: Mildly decreased breath sounds with expiratory wheezing bilaterally. No increased work of breathing. CV: Normal S1, S2 without murmurs. Capillary refill <2 seconds.  Abdomen: Nondistended, nontender. No guarding or rebound tenderness. Skin: Warm and dry, without lesions or rashes. Extremities:  No clubbing, cyanosis or edema. Neuro:   Grossly intact.   Diagnostic studies:  Spirometry: results abnormal (FEV1: 2.27/81%, FVC: 3.62/111%, FEV1/FVC: 62%).    Spirometry consistent with normal pattern. The report reads mild obstructive disease, however this is likely secondary to the FVC of 111%. There is no scooping to suggestive obstructive disease.    Allergy Studies:  None    Michelle BondsJoel Kayly Kriegel, MD [facility] [place]

## 2016-04-13 LAB — CBC WITH DIFFERENTIAL/PLATELET
BASOS PCT: 1 %
Basophils Absolute: 57 cells/uL (ref 0–200)
EOS PCT: 11 %
Eosinophils Absolute: 627 cells/uL — ABNORMAL HIGH (ref 15–500)
HCT: 40.4 % (ref 35.0–45.0)
Hemoglobin: 13.2 g/dL (ref 11.7–15.5)
LYMPHS PCT: 39 %
Lymphs Abs: 2223 cells/uL (ref 850–3900)
MCH: 30.1 pg (ref 27.0–33.0)
MCHC: 32.7 g/dL (ref 32.0–36.0)
MCV: 92 fL (ref 80.0–100.0)
MONOS PCT: 6 %
MPV: 9.4 fL (ref 7.5–12.5)
Monocytes Absolute: 342 cells/uL (ref 200–950)
Neutro Abs: 2451 cells/uL (ref 1500–7800)
Neutrophils Relative %: 43 %
PLATELETS: 218 10*3/uL (ref 140–400)
RBC: 4.39 MIL/uL (ref 3.80–5.10)
RDW: 13.6 % (ref 11.0–15.0)
WBC: 5.7 10*3/uL (ref 3.8–10.8)

## 2016-04-26 ENCOUNTER — Other Ambulatory Visit: Payer: Self-pay | Admitting: Internal Medicine

## 2016-09-27 ENCOUNTER — Ambulatory Visit: Payer: Medicare Other | Admitting: Allergy & Immunology

## 2016-10-27 ENCOUNTER — Other Ambulatory Visit: Payer: Self-pay | Admitting: *Deleted

## 2016-10-27 MED ORDER — BUDESONIDE-FORMOTEROL FUMARATE 160-4.5 MCG/ACT IN AERO: INHALATION_SPRAY | RESPIRATORY_TRACT | 0 refills | Status: DC

## 2016-11-29 ENCOUNTER — Ambulatory Visit (INDEPENDENT_AMBULATORY_CARE_PROVIDER_SITE_OTHER): Payer: Medicare Other | Admitting: Family Medicine

## 2016-11-29 ENCOUNTER — Encounter: Payer: Self-pay | Admitting: Family Medicine

## 2016-11-29 VITALS — BP 118/76 | HR 60 | Temp 98.1°F | Resp 18 | Ht 67.0 in | Wt 184.1 lb

## 2016-11-29 DIAGNOSIS — M545 Low back pain, unspecified: Secondary | ICD-10-CM

## 2016-11-29 DIAGNOSIS — Z13220 Encounter for screening for lipoid disorders: Secondary | ICD-10-CM | POA: Diagnosis not present

## 2016-11-29 DIAGNOSIS — Z1231 Encounter for screening mammogram for malignant neoplasm of breast: Secondary | ICD-10-CM

## 2016-11-29 DIAGNOSIS — Z1211 Encounter for screening for malignant neoplasm of colon: Secondary | ICD-10-CM

## 2016-11-29 DIAGNOSIS — E559 Vitamin D deficiency, unspecified: Secondary | ICD-10-CM

## 2016-11-29 DIAGNOSIS — F5104 Psychophysiologic insomnia: Secondary | ICD-10-CM

## 2016-11-29 DIAGNOSIS — M4325 Fusion of spine, thoracolumbar region: Secondary | ICD-10-CM | POA: Diagnosis not present

## 2016-11-29 DIAGNOSIS — J4540 Moderate persistent asthma, uncomplicated: Secondary | ICD-10-CM | POA: Diagnosis not present

## 2016-11-29 DIAGNOSIS — G8929 Other chronic pain: Secondary | ICD-10-CM

## 2016-11-29 DIAGNOSIS — Z79890 Hormone replacement therapy (postmenopausal): Secondary | ICD-10-CM

## 2016-11-29 DIAGNOSIS — Z659 Problem related to unspecified psychosocial circumstances: Secondary | ICD-10-CM

## 2016-11-29 DIAGNOSIS — Z1159 Encounter for screening for other viral diseases: Secondary | ICD-10-CM

## 2016-11-29 DIAGNOSIS — Z136 Encounter for screening for cardiovascular disorders: Secondary | ICD-10-CM

## 2016-11-29 DIAGNOSIS — Z1239 Encounter for other screening for malignant neoplasm of breast: Secondary | ICD-10-CM

## 2016-11-29 LAB — CBC
HCT: 45.2 % — ABNORMAL HIGH (ref 35.0–45.0)
HEMOGLOBIN: 14.9 g/dL (ref 11.7–15.5)
MCH: 29.8 pg (ref 27.0–33.0)
MCHC: 33 g/dL (ref 32.0–36.0)
MCV: 90.4 fL (ref 80.0–100.0)
MPV: 9.5 fL (ref 7.5–12.5)
PLATELETS: 254 10*3/uL (ref 140–400)
RBC: 5 MIL/uL (ref 3.80–5.10)
RDW: 14 % (ref 11.0–15.0)
WBC: 6.2 10*3/uL (ref 3.8–10.8)

## 2016-11-29 LAB — HEPATITIS C ANTIBODY: HCV Ab: NEGATIVE

## 2016-11-29 MED ORDER — TRAZODONE HCL 50 MG PO TABS: 25.0000 mg | ORAL_TABLET | Freq: Every evening | ORAL | 3 refills | Status: DC | PRN

## 2016-11-29 MED ORDER — PREDNISONE 20 MG PO TABS: 20.0000 mg | ORAL_TABLET | Freq: Two times a day (BID) | ORAL | 2 refills | Status: DC

## 2016-11-29 NOTE — Progress Notes (Signed)
Chief Complaint  Patient presents with  . Asthma   New to establish Has chronic moderate persistent asthma followed by pulmonary medicine.  Currently with mild flare of wheezing.  No fever or sputum production.  Is compliant with her symbicort,   singulair and prn albuterol.  Will give prednisone. Has chronic pain from idiopathic thoracolumbar scoliosis since childhood.  Has a fusion as an adult.  Needed a second procedure for broken hardware.  Is disabled and followed by a spine clinic on morphine daily. Tizanidine prn. She has chronic insomnia and requests ambien.  I decline due to her narcotic use.  Will try trazodone. Is on protonix for GERD with good effect Is on estrace for "hot flashes" for 3-4 years.  Uses daily.  Is advised to reduce to every other day.  Will try to taper as tolerated.  Had a hysterectomy for "pain". Is due for mammogram Has never had a colonoscopy.  No colon ca in her family.  She is low risk.  Will order cologuard. Discussed hepatitis C screening, reasons, and she desires testing.  Low risk. Takes topamax for headache prevention.  Chart says migraine, but she says post trauma. Stressed because her sig other has metastatic colon cancer and is getting palliative care.  Is his caregiver with little help.    Patient Active Problem List   Diagnosis Date Noted  . 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 Chronic insomnia 11/29/2016  . Eosinophilia 03/29/2016  . Moderate persistent asthma, uncomplicated 03/29/2016  . 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 Other social stressor 03/29/2016  . 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 Arthritis of knee, degenerative 07/09/2015  . Chronic low back pain 04/15/2015  . Cough variant asthma 08/09/2014  . Pulmonary infiltrates with eosinophilia (HCC) 08/09/2014    Outpatient Encounter Prescriptions as of 11/29/2016  Medication Sig  . albuterol (PROVENTIL HFA;VENTOLIN HFA) 108 (90  Base) MCG/ACT inhaler Inhale into the lungs every 6 (six) hours as needed for wheezing or shortness of breath.  Marland Kitchen albuterol (PROVENTIL) (2.5 MG/3ML) 0.083% nebulizer solution Nebulize one ampule every 4 hours as needed for cough or wheeze.  . budesonide-formoterol (SYMBICORT) 160-4.5 MCG/ACT inhaler Use 2 puffs twice daily to prevent cough or wheeze.  Rinse, gargle, and spit after use.  . diclofenac (VOLTAREN) 75 MG EC tablet Take 75 mg by mouth 2 (two) times daily with a meal.  . estradiol (ESTRACE) 1 MG tablet   . fluticasone (FLONASE) 50 MCG/ACT nasal spray Place 2 sprays into the nose 2 (two) times daily.    . montelukast (SINGULAIR) 10 MG tablet Take 10 mg by mouth at bedtime.   Marland Kitchen morphine (MSIR) 15 MG tablet Take 15 mg by mouth every 8 (eight) hours as needed. Back Pain    . pantoprazole (PROTONIX) 40 MG tablet TAKE 1 TABLET BY MOUTH EVERY DAY  . tiZANidine (ZANAFLEX) 4 MG tablet Take 4 mg by mouth 2 (two) times daily as needed.  . topiramate (TOPAMAX) 25 MG tablet Take 2 tablets by mouth at bedtime as needed.  . predniSONE (DELTASONE) 20 MG tablet Take 1 tablet (20 mg total) by mouth 2 (two) times daily with a meal.  . traZODone (DESYREL) 50 MG tablet Take 0.5-1 tablets (25-50 mg total) by mouth at bedtime as needed for sleep.   No facility-administered encounter medications on file as of 11/29/2016.     Past Medical History:  Diagnosis Date  . Arthritis    spine,  R knee  . Asthma   . Back pain   . Blood transfusion without reported diagnosis    back surgery  . Cough 09/03/2014   Followed in Pulmonary clinic/ [facility]/ [place] - sinus CT 09/08/2014> Mild mucosal thickening involves the paranasal sinuses  - Eos 1.2  08/06/14 - Allergy profile 11/27/14 >  Eos 1.0/ IgE  41 with neg RAST    . GERD (gastroesophageal reflux disease)     Past Surgical History:  Procedure Laterality Date  . ABDOMINAL HYSTERECTOMY     "cysts", pelvic pain  . BACK SURGERY    . KNEE ARTHROSCOPY Right  2017  . SPINE SURGERY  2010   fusion  . SPINE SURGERY  2014   remove hardware    Social History   Social History  . Marital status: Significant Other    Spouse name: Clide CliffRicky  . Number of children: 2  . Years of education: 12   Occupational History  . disabled     back   Social History Main Topics  . Smoking status: Never Smoker  . Smokeless tobacco: Never Used  . Alcohol use No  . Drug use: No  . Sexual activity: Yes    Birth control/ protection: Surgical   Other Topics Concern  . Not on file   Social History Narrative   Lives with Clide CliffRicky - 27 years   Clide CliffRicky has end stage colon cancer   Two sons/ grandchildren   Disabled from back pain    Family History  Problem Relation Age of Onset  . Emphysema Maternal Grandmother        never smoker  . COPD Maternal Grandmother   . Emphysema Maternal Uncle        smoked  . COPD Maternal Uncle   . Arthritis Mother   . Diabetes Mother   . Hyperlipidemia Mother   . Hypertension Mother   . Early death Father 2036       MVA  . Asthma Sister   . Asthma Brother   . Heart disease Maternal Grandfather     Review of Systems  Constitutional: Negative for chills, fever and weight loss.  HENT: Negative for congestion and hearing loss.   Eyes: Negative for blurred vision and pain.  Respiratory: Positive for shortness of breath and wheezing. Negative for cough.   Cardiovascular: Negative for chest pain and leg swelling.  Gastrointestinal: Negative for abdominal pain, constipation, diarrhea and heartburn.  Genitourinary: Negative for dysuria and frequency.  Musculoskeletal: Positive for back pain. Negative for falls, joint pain and myalgias.  Neurological: Positive for headaches. Negative for dizziness and seizures.  Psychiatric/Behavioral: Negative for depression. The patient has insomnia. The patient is not nervous/anxious.     BP 118/76 (BP Location: Right Arm, Patient Position: Sitting, Cuff Size: Normal)   Pulse 60   Temp  98.1 F (36.7 C) (Temporal)   Resp 18   Ht 5\' 7"  (1.702 m)   Wt 184 lb 1.9 oz (83.5 kg)   LMP 08/30/2002 (Approximate)   SpO2 97%   BMI 28.84 kg/m   Physical Exam  Constitutional: She is oriented to person, place, and time. She appears well-developed and well-nourished.  Guarded movement.  stiff  HENT:  Head: Normocephalic and atraumatic.  Mouth/Throat: Oropharynx is clear and moist.  Eyes: Conjunctivae are normal. Pupils are equal, round, and reactive to light.  Neck: Normal range of motion. Neck supple. No thyromegaly present.  Cardiovascular: Normal rate, regular rhythm and normal heart sounds.  Pulmonary/Chest: Effort normal. No respiratory distress. She has wheezes.  Musculoskeletal: Normal range of motion. She exhibits no edema.  Lymphadenopathy:    She has no cervical adenopathy.  Neurological: She is alert and oriented to person, place, and time.  Gait normal  Skin: Skin is warm and dry.  Psychiatric: She has a normal mood and affect. Her behavior is normal. Thought content normal.  Nursing note and vitals reviewed.   1. Moderate persistent asthma, uncomplicated Prednisone for 5 d.  Refills. - COMPLETE METABOLIC PANEL WITH GFR - CBC  2. Fusion of spine, thoracolumbar region  3. Chronic midline low back pain without sciatica Narcotic depedent  4. Other social stressor family  5. Chronic insomnia Discussed.  Trial trazodone  6. Post-menopause on HRT (hormone replacement therapy) Taper the estradiol  7. Screening for breast cancer - MM Digital Screening; Future  8. Screen for colon cancer - Cologuard  9. Vitamin D deficiency - VITAMIN D 25 Hydroxy (Vit-D Deficiency, Fractures)  10. Encounter for hepatitis C screening test for low risk patient - Hepatitis C antibody  11. Encounter for lipid screening for cardiovascular disease - Lipid panel Greater than 50% of this visit was spent in counseling and coordinating care.  Total face to face time:  60  min.  Discussed caregiver stress.  Reviewed wellness postmenopausal woman and recommended maintenance   Patient Instructions  Use the estrogen every other day.  Goal is to reduce to twice a week if the hot flashes allow  I will send you a letter with your test results.  If there is anything of concern, we will call right away.  Take the trazodone 1/2 tab an hour before bed.  Is this does not help sleep, take a whole tab  Take the prednisone as directed Fill again and keep at home for emergencies ( there are refills)  See me in 6-8 weeks ( with Clide Cliff)  Call sooner for problems     Eustace Moore, MD

## 2016-11-29 NOTE — Patient Instructions (Signed)
Use the estrogen every other day.  Goal is to reduce to twice a week if the hot flashes allow  I will send you a letter with your test results.  If there is anything of concern, we will call right away.  Take the trazodone 1/2 tab an hour before bed.  Is this does not help sleep, take a whole tab  Take the prednisone as directed Fill again and keep at home for emergencies ( there are refills)  [facility] weeks ( [facility]  Call sooner for problems

## 2016-11-30 ENCOUNTER — Encounter: Payer: Self-pay | Admitting: Family Medicine

## 2016-11-30 LAB — COMPLETE METABOLIC PANEL WITH GFR
ALT: 16 U/L (ref 6–29)
AST: 16 U/L (ref 10–35)
Albumin: 4.2 g/dL (ref 3.6–5.1)
Alkaline Phosphatase: 100 U/L (ref 33–130)
BILIRUBIN TOTAL: 0.5 mg/dL (ref 0.2–1.2)
BUN: 19 mg/dL (ref 7–25)
CHLORIDE: 106 mmol/L (ref 98–110)
CO2: 23 mmol/L (ref 20–31)
Calcium: 9.7 mg/dL (ref 8.6–10.4)
Creat: 0.79 mg/dL (ref 0.50–1.05)
GFR, EST NON AFRICAN AMERICAN: 86 mL/min (ref >=60)
Glucose, Bld: 88 mg/dL (ref 65–99)
POTASSIUM: 4.5 mmol/L (ref 3.5–5.3)
Sodium: 141 mmol/L (ref 135–146)
TOTAL PROTEIN: 7 g/dL (ref 6.1–8.1)

## 2016-11-30 LAB — LIPID PANEL
Cholesterol: 206 mg/dL — ABNORMAL HIGH (ref <200)
HDL: 43 mg/dL — AB (ref >50)
LDL CALC: 98 mg/dL (ref <100)
TRIGLYCERIDES: 326 mg/dL — AB (ref <150)
Total CHOL/HDL Ratio: 4.8 Ratio (ref <5.0)
VLDL: 65 mg/dL — AB (ref <30)

## 2016-11-30 LAB — VITAMIN D 25 HYDROXY (VIT D DEFICIENCY, FRACTURES): Vit D, 25-Hydroxy: 27 ng/mL — ABNORMAL LOW (ref 30–100)

## 2016-12-07 ENCOUNTER — Other Ambulatory Visit: Payer: Self-pay | Admitting: Allergy & Immunology

## 2016-12-31 ENCOUNTER — Other Ambulatory Visit: Payer: Self-pay | Admitting: Family Medicine

## 2017-01-02 ENCOUNTER — Encounter: Payer: Self-pay | Admitting: Family Medicine

## 2017-01-02 LAB — COLOGUARD

## 2017-01-31 ENCOUNTER — Other Ambulatory Visit: Payer: Self-pay | Admitting: Family Medicine

## 2017-02-07 ENCOUNTER — Telehealth: Payer: Self-pay | Admitting: Family Medicine

## 2017-02-07 NOTE — Telephone Encounter (Signed)
Left message to call office

## 2017-02-07 NOTE — Telephone Encounter (Signed)
Returned call

## 2017-02-07 NOTE — Telephone Encounter (Signed)
She got a letter with the lab results I ordered in June.  Is there something else?

## 2017-02-07 NOTE — Telephone Encounter (Signed)
Patient left message on nurse line. She states that she is calling to get recent test results.  Callback# [phone]

## 2017-02-08 ENCOUNTER — Other Ambulatory Visit: Payer: Self-pay

## 2017-02-10 NOTE — Telephone Encounter (Signed)
Spoke to pt, asking about cologuard results, which were negative.

## 2017-03-13 ENCOUNTER — Emergency Department (HOSPITAL_COMMUNITY)
Admission: EM | Admit: 2017-03-13 | Discharge: 2017-03-13 | Disposition: A | Discharge status: Home / Self Care | Payer: Medicare Other | Attending: Emergency Medicine | Admitting: Emergency Medicine

## 2017-03-13 ENCOUNTER — Emergency Department (HOSPITAL_COMMUNITY): Payer: Medicare Other

## 2017-03-13 ENCOUNTER — Encounter (HOSPITAL_COMMUNITY): Payer: Self-pay | Admitting: Emergency Medicine

## 2017-03-13 DIAGNOSIS — Z79899 Other long term (current) drug therapy: Secondary | ICD-10-CM | POA: Insufficient documentation

## 2017-03-13 DIAGNOSIS — Y998 Other external cause status: Secondary | ICD-10-CM | POA: Diagnosis not present

## 2017-03-13 DIAGNOSIS — S82831A Other fracture of upper and lower end of right fibula, initial encounter for closed fracture: Secondary | ICD-10-CM | POA: Insufficient documentation

## 2017-03-13 DIAGNOSIS — Y939 Activity, unspecified: Secondary | ICD-10-CM | POA: Diagnosis not present

## 2017-03-13 DIAGNOSIS — X501XXA Overexertion from prolonged static or awkward postures, initial encounter: Secondary | ICD-10-CM | POA: Diagnosis not present

## 2017-03-13 DIAGNOSIS — Y92019 Unspecified place in single-family (private) house as the place of occurrence of the external cause: Secondary | ICD-10-CM | POA: Insufficient documentation

## 2017-03-13 DIAGNOSIS — J45909 Unspecified asthma, uncomplicated: Secondary | ICD-10-CM | POA: Insufficient documentation

## 2017-03-13 DIAGNOSIS — S99911A Unspecified injury of right ankle, initial encounter: Secondary | ICD-10-CM | POA: Diagnosis present

## 2017-03-13 NOTE — ED Provider Notes (Signed)
[facility]-EMERGENCY DEPT Provider Note   CSN: [id number] Arrival date & time: 03/13/17  1342     History   Chief Complaint Chief Complaint  Patient presents with  . Ankle Pain    HPI [name] is a 56 y.o. female.  The history is provided by the patient. No language interpreter was used.  Ankle Pain   The incident occurred yesterday. The incident occurred at home. The injury mechanism was torsion. The pain is present in the right ankle. The quality of the pain is described as aching. The pain is moderate. The pain has been constant since onset. Pertinent negatives include no loss of sensation. She reports no foreign bodies present. She has tried nothing for the symptoms. The treatment provided mild relief.  Pt reports she twisted her right ankle.  Pt complains of swelling and pain.    Past Medical History:  Diagnosis Date  . Arthritis    spine, R knee  . Asthma   . Back pain   . Blood transfusion without reported diagnosis    back surgery  . Cough 09/03/2014   Followed in Pulmonary clinic/ [facility]/ [place] - sinus CT 09/08/2014> Mild mucosal thickening involves the paranasal sinuses  - Eos 1.2  08/06/14 - Allergy profile 11/27/14 >  Eos 1.0/ IgE  41 with neg RAST    . GERD (gastroesophageal reflux disease)     Patient Active Problem List   Diagnosis Date Noted  . 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 Chronic insomnia 11/29/2016  . Eosinophilia 03/29/2016  . Moderate persistent asthma, uncomplicated 03/29/2016  . 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 Other social stressor 03/29/2016  . 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 Arthritis of knee, degenerative 07/09/2015  . Chronic low back pain 04/15/2015  . Cough variant asthma 08/09/2014  . Pulmonary infiltrates with eosinophilia (HCC) 08/09/2014    Past Surgical History:  Procedure Laterality Date  . ABDOMINAL HYSTERECTOMY     "cysts", pelvic pain  . BACK SURGERY    .  KNEE ARTHROSCOPY Right 2017  . SPINE SURGERY  2010   fusion  . SPINE SURGERY  2014   remove hardware    OB History    No data available       Home Medications    Prior to Admission medications   Medication Sig Start Date End Date Taking? Authorizing Provider  albuterol (PROVENTIL HFA;VENTOLIN HFA) 108 (90 Base) MCG/ACT inhaler Inhale into the lungs every 6 (six) hours as needed for wheezing or shortness of breath.    [provider]  albuterol (PROVENTIL) (2.5 MG/3ML) 0.083% nebulizer solution Nebulize one ampule every 4 hours as needed for cough or wheeze. 09/10/15   Baxter Hire, MD  budesonide-formoterol [facility]) 160-4.5 MCG/ACT inhaler Use 2 puffs twice daily to prevent cough or wheeze.  Rinse, gargle, and spit after use. 10/27/16   Alfonse Spruce, MD  diclofenac (VOLTAREN) 75 MG EC tablet Take 75 mg by mouth 2 (two) times daily with a meal. 11/07/16   [provider]  estradiol (ESTRACE) 1 MG tablet  02/16/15   [provider]  fluticasone (FLONASE) 50 MCG/ACT nasal spray Place 2 sprays into the nose 2 (two) times daily.      [provider]  montelukast (SINGULAIR) 10 MG tablet Take 10 mg by mouth at bedtime.     [provider]  morphine (MSIR) 15 MG tablet Take 15 mg by mouth every 8 (  eight) hours as needed. Back Pain      [provider]  pantoprazole (PROTONIX) 40 MG tablet TAKE 1 TABLET BY MOUTH EVERY DAY 03/17/15   Nyoka Cowden, MD  predniSONE (DELTASONE) 20 MG tablet TAKE ONE TABLET BY MOUTH TWICE DAILY WITH A MEAL 02/01/17   Eustace Moore, MD  tiZANidine (ZANAFLEX) 4 MG tablet Take 4 mg by mouth 2 (two) times daily as needed.    [provider]  topiramate (TOPAMAX) 25 MG tablet Take 2 tablets by mouth at bedtime as needed. 11/26/15 11/29/16  [provider]  traZODone (DESYREL) 50 MG tablet Take 0.5-1 tablets (25-50 mg total) by mouth at bedtime as needed for sleep. 11/29/16   Eustace Moore, MD    Family History Family History  Problem Relation Age of Onset  . Emphysema Maternal Grandmother        never smoker  . COPD Maternal Grandmother   . Emphysema Maternal Uncle        smoked  . COPD Maternal Uncle   . Arthritis Mother   . Diabetes Mother   . Hyperlipidemia Mother   . Hypertension Mother   . Early death Father 31       MVA  . Asthma Sister   . Asthma Brother   . Heart disease Maternal Grandfather     Social History Social History  Substance Use Topics  . Smoking status: Never Smoker  . Smokeless tobacco: Never Used  . Alcohol use No     Allergies   Patient has no known allergies.   Review of Systems Review of Systems  All other systems reviewed and are negative.    Physical Exam Updated Vital Signs BP (!) 161/80 (BP Location: Right Arm)   Pulse 60   Temp 98.2 F (36.8 C) (Oral)   Resp 18   Ht  (1.702 m)   Wt 81.6 kg (180 lb)   LMP 08/30/2002 (Approximate)   SpO2 97%   BMI 28.19 kg/m   Physical Exam  Constitutional: She appears well-developed and well-nourished.  HENT:  Head: Normocephalic.  Musculoskeletal: She exhibits tenderness and deformity.  Swollen tender right ankle,  Pain with movement,  nv and ns intact  Neurological: She is alert.  Skin: Skin is warm.  Psychiatric: She has a normal mood and affect.  Nursing note and vitals reviewed.    ED Treatments / Results  Labs (all labs ordered are listed, but only abnormal results are displayed) Labs Reviewed - No data to display  EKG  EKG Interpretation None       Radiology Dg Ankle Complete Right  Result Date: 03/13/2017 CLINICAL DATA:  56 year old female with status post fall while roller-skating yesterday. Lateral pain swelling and bruising. EXAM: RIGHT ANKLE - COMPLETE 3+ VIEW COMPARISON:  None. FINDINGS: Oblique or spiral comminuted fracture of the distal right fibula metadiaphysis with minimal displacement (image 2). The fracture extends to the  mortise joint level, but there is no definite joint effusion. Mortise joint alignment is maintained. Talar dome intact. The more proximal visible fibula, and the visualized tibia appear intact. Calcaneus intact with degenerative spurring. Visible right foot appears intact. Lateral soft tissue swelling. IMPRESSION: Mildly comminuted, minimally displaced spiral fracture of the distal right fibula metadiaphysis. Electronically Signed   By: Odessa Fleming M.D.   On: 03/13/2017 14:13    Procedures .Splint Application Date/Time: 03/13/2017 2:52 PM Performed by: Elson Areas Authorized by: Elson Areas   Consent:  Consent obtained:  Verbal   Consent given by:  Patient   Risks discussed:  Pain   Alternatives discussed:  No treatment Procedure details:    Laterality:  Right   Location:  Ankle   Strapping: no     Cast type:  Short leg   Splint type:  Short leg Post-procedure details:    Pain:  Improved   Sensation:  Normal   Patient tolerance of procedure:  Tolerated well, no immediate complications   (including critical care time)  Medications Ordered in ED Medications - No data to display   Initial Impression / Assessment and Plan / ED Course  I have reviewed the triage vital signs and the nursing notes.  Pertinent labs & imaging results that were available during my care of the patient were reviewed by me and considered in my medical decision making (see chart for details).     Pt sees Dr. Madelon Lips.  I advised see him in 3-4 days for follow up and caasting.  Pt placed in a posterior splint and given  Cruthces.      Final Clinical Impressions(s) / ED Diagnoses   Final diagnoses:  Closed fracture of distal end of right fibula, unspecified fracture morphology, initial encounter    New Prescriptions New Prescriptions   No medications on file  Pt declined pain medication An After Visit Summary was printed and given to the patient.    Elson Areas, PA-C 03/13/17 1452      Osie Cheeks 03/13/17 1454    Loren Racer, MD 03/13/17 1525

## 2017-03-13 NOTE — ED Triage Notes (Signed)
Patient states she twisted her ankle yesterday. States pain, swelling and bruising today. Patient is ambulatory in triage.

## 2017-03-19 ENCOUNTER — Other Ambulatory Visit: Payer: Self-pay | Admitting: Family Medicine

## 2017-03-20 NOTE — Telephone Encounter (Signed)
Seen 6 5 18 

## 2017-05-09 ENCOUNTER — Other Ambulatory Visit: Payer: Self-pay | Admitting: Orthopaedic Surgery

## 2017-05-09 DIAGNOSIS — M4716 Other spondylosis with myelopathy, lumbar region: Secondary | ICD-10-CM

## 2017-05-22 ENCOUNTER — Ambulatory Visit
Admission: RE | Admit: 2017-05-22 | Discharge: 2017-05-22 | Disposition: A | Discharge status: Home / Self Care | Payer: Medicare Other | Source: Ambulatory Visit | Attending: Orthopaedic Surgery | Admitting: Orthopaedic Surgery

## 2017-05-22 DIAGNOSIS — M4716 Other spondylosis with myelopathy, lumbar region: Secondary | ICD-10-CM

## 2017-05-31 ENCOUNTER — Other Ambulatory Visit: Payer: Self-pay

## 2017-05-31 ENCOUNTER — Encounter: Payer: Self-pay | Admitting: Family Medicine

## 2017-05-31 ENCOUNTER — Ambulatory Visit (INDEPENDENT_AMBULATORY_CARE_PROVIDER_SITE_OTHER): Payer: Medicare Other | Admitting: Family Medicine

## 2017-05-31 VITALS — BP 120/80 | HR 72 | Temp 98.5°F | Resp 20 | Ht 67.0 in | Wt 191.0 lb

## 2017-05-31 DIAGNOSIS — J4541 Moderate persistent asthma with (acute) exacerbation: Secondary | ICD-10-CM

## 2017-05-31 DIAGNOSIS — J4540 Moderate persistent asthma, uncomplicated: Secondary | ICD-10-CM | POA: Diagnosis not present

## 2017-05-31 MED ORDER — MONTELUKAST SODIUM 10 MG PO TABS: 10.0000 mg | ORAL_TABLET | Freq: Every day | ORAL | 3 refills | Status: DC

## 2017-05-31 MED ORDER — ALBUTEROL SULFATE HFA 108 (90 BASE) MCG/ACT IN AERS: 2.0000 | INHALATION_SPRAY | Freq: Four times a day (QID) | RESPIRATORY_TRACT | 1 refills | Status: DC | PRN

## 2017-05-31 MED ORDER — PREDNISONE 20 MG PO TABS: 20.0000 mg | ORAL_TABLET | Freq: Two times a day (BID) | ORAL | 0 refills | Status: DC

## 2017-05-31 MED ORDER — METHYLPREDNISOLONE ACETATE 80 MG/ML IJ SUSP
80.0000 mg | Freq: Once | INTRAMUSCULAR | Status: AC
  Administered 2017-05-31: 80 mg via INTRAMUSCULAR

## 2017-05-31 MED ORDER — ALBUTEROL SULFATE (2.5 MG/3ML) 0.083% IN NEBU: INHALATION_SOLUTION | RESPIRATORY_TRACT | 2 refills | Status: DC

## 2017-05-31 MED ORDER — IPRATROPIUM-ALBUTEROL 0.5-2.5 (3) MG/3ML IN SOLN: 3.0000 mL | Freq: Once | RESPIRATORY_TRACT | Status: DC

## 2017-05-31 MED ORDER — BUDESONIDE-FORMOTEROL FUMARATE 160-4.5 MCG/ACT IN AERO: INHALATION_SPRAY | RESPIRATORY_TRACT | 5 refills | Status: DC

## 2017-05-31 NOTE — Patient Instructions (Signed)
Push fluids Take prednisone twice a day for 5 days Go back on Symbicort Use albuterol as needed Take Singulair daily, stay on this for allergies, even if you feel better Call if not improving by the end of the week

## 2017-05-31 NOTE — Progress Notes (Signed)
Chief Complaint  Patient presents with  . URI    x 1 week   Patient has moderate persistent asthma, and environmental allergies.  Because she has been very busy lately, taking care of her husband with cancer, and the family, she has not been consistent with her asthma medication.  She also has been feeling very well, was hoping perhaps she did not need them.  In any event, she started having allergy symptoms 2 weeks ago.  She has become progressive more short of breath with wheezing and coughing over the next week or so.  She has sinus congestion, runny and stuffy nose, postnasal drip.  Coughing with scant thick sputum.  The postnasal drip and sputum is clear to white.  No fever or chills.  No chest pain.  No malaise.  No suggestion of infection.  She did have some albuterol nebulizer at home and has been using this.  It helps briefly, but she finds she is needing it more more often in order to breathe.  She did call her allergy specialist, but cannot be seen until later in the week.  She is here today as an urgent work in appointment.  No dizziness or drowsiness.  No presyncope.  No headache.  Patient Active Problem List   Diagnosis Date Noted  . 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 Chronic insomnia 11/29/2016  . Eosinophilia 03/29/2016  . Moderate persistent asthma, uncomplicated 03/29/2016  . 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 Other social stressor 03/29/2016  . 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 Arthritis of knee, degenerative 07/09/2015  . Chronic low back pain 04/15/2015  . Cough variant asthma 08/09/2014  . Pulmonary infiltrates with eosinophilia (HCC) 08/09/2014    Outpatient Encounter Medications as of 05/31/2017  Medication Sig  . albuterol (PROVENTIL HFA;VENTOLIN HFA) 108 (90 Base) MCG/ACT inhaler Inhale 2 puffs into the lungs every 6 (six) hours as needed for wheezing or shortness of breath.  Marland Kitchen. albuterol (PROVENTIL) (2.5  MG/3ML) 0.083% nebulizer solution Nebulize one ampule every 4 hours as needed for cough or wheeze.  . budesonide-formoterol (SYMBICORT) 160-4.5 MCG/ACT inhaler Use 2 puffs twice daily to prevent cough or wheeze.  Rinse, gargle, and spit after use.  . diclofenac (VOLTAREN) 75 MG EC tablet Take 75 mg by mouth 2 (two) times daily with a meal.  . estradiol (ESTRACE) 1 MG tablet   . fluticasone (FLONASE) 50 MCG/ACT nasal spray Place 2 sprays into the nose 2 (two) times daily.    . montelukast (SINGULAIR) 10 MG tablet Take 1 tablet (10 mg total) by mouth at bedtime.  Marland Kitchen. morphine (MSIR) 15 MG tablet Take 15 mg by mouth every 8 (eight) hours as needed. Back Pain    . pantoprazole (PROTONIX) 40 MG tablet TAKE 1 TABLET BY MOUTH EVERY DAY  . predniSONE (DELTASONE) 20 MG tablet Take 1 tablet (20 mg total) by mouth 2 (two) times daily with a meal.  . tiZANidine (ZANAFLEX) 4 MG tablet Take 4 mg by mouth 2 (two) times daily as needed.  . traZODone (DESYREL) 50 MG tablet TAKE 1/2 TO 1 TABLET BY MOUTH AT BEDTIME AS NEEDED FOR SLEEP  . predniSONE (DELTASONE) 20 MG tablet Take 1 tablet (20 mg total) by mouth 2 (two) times daily with a meal.  . topiramate (TOPAMAX) 25 MG tablet Take 2 tablets by mouth at bedtime as needed.   No facility-administered encounter medications on file as of 05/31/2017.  No Known Allergies  Review of Systems  Constitutional: Positive for appetite change. Negative for activity change, chills, fatigue and fever.  HENT: Positive for congestion, postnasal drip and rhinorrhea. Negative for sinus pressure, sinus pain and sore throat.   Eyes: Negative for redness and visual disturbance.  Respiratory: Positive for cough, shortness of breath and wheezing.   Cardiovascular: Negative for chest pain, palpitations and leg swelling.  Gastrointestinal: Negative for diarrhea, nausea and vomiting.  Musculoskeletal: Positive for arthralgias. Negative for back pain.       Chronic  Neurological:  Negative for weakness and headaches.  Psychiatric/Behavioral: Negative for behavioral problems. The patient is not nervous/anxious.     BP 120/80 (BP Location: Left Arm, Patient Position: Sitting, Cuff Size: Normal)   Pulse 72   Temp 98.5 F (36.9 C) (Temporal)   Resp 20   Ht 5\' 7"  (1.702 m)   Wt 191 lb (86.6 kg)   LMP 08/30/2002 (Approximate)   SpO2 94%   BMI 29.91 kg/m   Physical Exam  Constitutional: She is oriented to person, place, and time. She appears well-developed and well-nourished.  Moderately ill, dyspneic  HENT:  Head: Normocephalic and atraumatic.  Right Ear: External ear normal.  Left Ear: External ear normal.  Mouth/Throat: Oropharynx is clear and moist.  Clear postnasal drip, mildly inflamed tonsils  Eyes: Conjunctivae are normal. Pupils are equal, round, and reactive to light.  Neck: Normal range of motion. Neck supple. No thyromegaly present.  Cardiovascular: Normal rate, regular rhythm and normal heart sounds.  Pulmonary/Chest: Effort normal. No respiratory distress. She has wheezes.  Tachypneic with conversation.  Wheezing inspiratory and expiratory throughout.  After nebulized treatment, wheezing persists, although improved..  No rales.  Musculoskeletal: Normal range of motion. She exhibits no edema.  Lymphadenopathy:    She has no cervical adenopathy.  Neurological: She is alert and oriented to person, place, and time.  Skin: Skin is warm and dry.  Psychiatric: She has a normal mood and affect. Her behavior is normal. Thought content normal.  Nursing note and vitals reviewed.   ASSESSMENT/PLAN:  1. Moderate persistent asthma, uncomplicated By history.  Patient has not been on her medication.  2. Moderate persistent asthma with exacerbation Exacerbation.  Likely from environmental allergies.  We will put her back on her chronic medications.  In addition I am giving her injection of Depo-Medrol 80 mg in the office to facilitate rapid  improvement.   Patient Instructions  Push fluids Take prednisone twice a day for 5 days Go back on Symbicort Use albuterol as needed Take Singulair daily, stay on this for allergies, even if you feel better Call if not improving by the end of the week     Eustace MooreYvonne Sue Sharai [place], MD

## 2017-06-01 ENCOUNTER — Ambulatory Visit: Payer: Medicare Other | Admitting: Allergy & Immunology

## 2017-06-01 DIAGNOSIS — J4540 Moderate persistent asthma, uncomplicated: Secondary | ICD-10-CM | POA: Diagnosis not present

## 2017-06-01 DIAGNOSIS — J4541 Moderate persistent asthma with (acute) exacerbation: Secondary | ICD-10-CM | POA: Diagnosis not present

## 2017-06-01 MED ORDER — ALBUTEROL SULFATE (2.5 MG/3ML) 0.083% IN NEBU
2.5000 mg | INHALATION_SOLUTION | Freq: Once | RESPIRATORY_TRACT | Status: AC
  Administered 2017-05-31: 2.5 mg via RESPIRATORY_TRACT

## 2017-06-01 MED ORDER — IPRATROPIUM BROMIDE 0.02 % IN SOLN
0.5000 mg | Freq: Once | RESPIRATORY_TRACT | Status: AC
  Administered 2017-06-01: 0.5 mg via RESPIRATORY_TRACT

## 2017-06-01 NOTE — Addendum Note (Signed)
Addended by: Crawford GivensPUTMAN, Damean Poffenberger M on: 06/01/2017 02:07 PM   Modules accepted: Orders

## 2017-06-02 ENCOUNTER — Other Ambulatory Visit: Payer: Self-pay | Admitting: Family Medicine

## 2017-06-02 NOTE — Telephone Encounter (Signed)
Are you going to rx this for her? 

## 2017-06-07 ENCOUNTER — Ambulatory Visit (INDEPENDENT_AMBULATORY_CARE_PROVIDER_SITE_OTHER): Payer: Medicare Other

## 2017-06-07 ENCOUNTER — Other Ambulatory Visit: Payer: Self-pay | Admitting: Family Medicine

## 2017-06-07 DIAGNOSIS — Z23 Encounter for immunization: Secondary | ICD-10-CM | POA: Diagnosis not present

## 2017-06-07 MED ORDER — PREDNISONE 20 MG PO TABS: 20.0000 mg | ORAL_TABLET | Freq: Two times a day (BID) | ORAL | 0 refills | Status: DC

## 2017-07-20 ENCOUNTER — Other Ambulatory Visit: Payer: Self-pay | Admitting: Family Medicine

## 2017-07-28 ENCOUNTER — Other Ambulatory Visit: Payer: Self-pay | Admitting: Family Medicine

## 2017-07-31 ENCOUNTER — Other Ambulatory Visit: Payer: Self-pay | Admitting: Family Medicine

## 2017-07-31 MED ORDER — ESTRADIOL 0.5 MG PO TABS: 0.5000 mg | ORAL_TABLET | Freq: Every day | ORAL | 1 refills | Status: DC

## 2017-07-31 NOTE — Telephone Encounter (Signed)
Are you going to rx this for her? 

## 2017-08-10 ENCOUNTER — Encounter: Payer: Self-pay | Admitting: Family Medicine

## 2017-08-10 ENCOUNTER — Ambulatory Visit (INDEPENDENT_AMBULATORY_CARE_PROVIDER_SITE_OTHER): Payer: Medicare Other | Admitting: Family Medicine

## 2017-08-10 VITALS — BP 126/82 | HR 57 | Temp 98.0°F | Resp 16 | Ht 67.0 in | Wt 195.4 lb

## 2017-08-10 DIAGNOSIS — H7201 Central perforation of tympanic membrane, right ear: Secondary | ICD-10-CM

## 2017-08-10 NOTE — Patient Instructions (Signed)
Need to see Dr Suszanne Connerseoh Do not put anything in the ear Call for problems

## 2017-08-10 NOTE — Progress Notes (Signed)
Chief Complaint  Patient presents with  . Otitis Media    was seen at urgent care first week in Feb for right ear infection. Still has 3 days left on amoxicillin but still cannot hear out of her right ear and its still painful and some tinnitis  Here for urgent care follow-up.  I do not have the records.  She went there for ear pain.  She was told she had otitis media.  She was treated with amoxicillin.  She got prednisone because she had some wheezing.  She has taken the medications.  She still cannot hear out of the right ear.  No current cough cold runny nose or fever.  No sinus pressure pain.  No sore throat.   Patient Active Problem List   Diagnosis Date Noted  . 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 Chronic insomnia 11/29/2016  . Eosinophilia 03/29/2016  . Moderate persistent asthma, uncomplicated 03/29/2016  . 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 Other social stressor 03/29/2016  . 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 Arthritis of knee, degenerative 07/09/2015  . Chronic low back pain 04/15/2015  . Cough variant asthma 08/09/2014  . Pulmonary infiltrates with eosinophilia (HCC) 08/09/2014    Outpatient Encounter Medications as of 08/10/2017  Medication Sig  . albuterol (PROVENTIL) (2.5 MG/3ML) 0.083% nebulizer solution Nebulize one ampule every 4 hours as needed for cough or wheeze.  . budesonide-formoterol (SYMBICORT) 160-4.5 MCG/ACT inhaler Use 2 puffs twice daily to prevent cough or wheeze.  Rinse, gargle, and spit after use.  . diclofenac (VOLTAREN) 75 MG EC tablet Take 75 mg by mouth 2 (two) times daily with a meal.  . estradiol (ESTRACE) 0.5 MG tablet Take 1 tablet (0.5 mg total) by mouth daily. Taper to every other day as tolerated.  . fluticasone (FLONASE) 50 MCG/ACT nasal spray Place 2 sprays into the nose 2 (two) times daily.    . montelukast (SINGULAIR) 10 MG tablet Take 1 tablet (10 mg total) by mouth at bedtime.    Marland Kitchen. morphine (MSIR) 15 MG tablet Take 15 mg by mouth every 8 (eight) hours as needed. Back Pain    . pantoprazole (PROTONIX) 40 MG tablet TAKE 1 TABLET BY MOUTH EVERY DAY  . predniSONE (DELTASONE) 20 MG tablet Take 1 tablet (20 mg total) by mouth 2 (two) times daily with a meal.  . predniSONE (DELTASONE) 20 MG tablet Take 1 tablet (20 mg total) by mouth 2 (two) times daily with a meal.  . tiZANidine (ZANAFLEX) 4 MG tablet Take 4 mg by mouth 2 (two) times daily as needed.  . traZODone (DESYREL) 50 MG tablet TAKE 1/2 TO 1 TABLET BY MOUTH AT BEDTIME AS NEEDED FOR SLEEP  . VENTOLIN HFA 108 (90 Base) MCG/ACT inhaler INHALE TWO PUFFS INTO LUNGS EVERY 6 HOURS AS NEEDED FOR WHEEZING OR SHORTNESS OF BREATH  . topiramate (TOPAMAX) 25 MG tablet Take 2 tablets by mouth at bedtime as needed.   No facility-administered encounter medications on file as of 08/10/2017.     No Known Allergies  Review of Systems  Constitutional: Negative for chills and fever.  HENT: Positive for ear discharge and ear pain. Negative for congestion, postnasal drip, rhinorrhea, sinus pressure and sore throat.   Eyes: Negative for redness and visual disturbance.  Respiratory: Negative for cough and shortness of breath.   All other systems reviewed and are negative.     BP 126/82   Pulse (!) 57  Temp 98 F (36.7 C) (Oral)   Resp 16   Ht 5\' 7"  (1.702 m)   Wt 195 lb 6.4 oz (88.6 kg)   LMP 08/30/2002 (Approximate)   SpO2 98%   BMI 30.60 kg/m   Physical Exam  Constitutional: She is oriented to person, place, and time. She appears well-developed and well-nourished. No distress.  HENT:  Head: Normocephalic and atraumatic.  Right Ear: External ear normal.  Left Ear: External ear normal.  Mouth/Throat: Oropharynx is clear and moist.  Right TM has a small perforation with speck of blood on TM.  No sign of infection.  Eyes: Conjunctivae are normal. Pupils are equal, round, and reactive to light.  Neck: Normal range of  motion.  Cardiovascular: Normal rate and regular rhythm.  Pulmonary/Chest: Effort normal and breath sounds normal.  Lymphadenopathy:    She has cervical adenopathy.  Neurological: She is alert and oriented to person, place, and time.  Psychiatric: She has a normal mood and affect. Her behavior is normal. Thought content normal.    ASSESSMENT/PLAN:  1. Tympanic membrane central perforation, right Discussed management.  Referred - Ambulatory referral to ENT   Patient Instructions  Need to see Dr Suszanne Conners Do not put anything in the ear Call for problems   Eustace Moore, MD

## 2017-09-04 ENCOUNTER — Encounter: Payer: Self-pay | Admitting: Family Medicine

## 2017-09-04 ENCOUNTER — Ambulatory Visit (INDEPENDENT_AMBULATORY_CARE_PROVIDER_SITE_OTHER): Payer: Medicare Other | Admitting: Otolaryngology

## 2017-09-04 DIAGNOSIS — H903 Sensorineural hearing loss, bilateral: Secondary | ICD-10-CM | POA: Diagnosis not present

## 2017-09-04 DIAGNOSIS — H6983 Other specified disorders of Eustachian tube, bilateral: Secondary | ICD-10-CM

## 2017-09-18 ENCOUNTER — Other Ambulatory Visit: Payer: Self-pay | Admitting: Family Medicine

## 2017-10-11 ENCOUNTER — Encounter: Payer: Self-pay | Admitting: Family Medicine

## 2017-10-11 ENCOUNTER — Ambulatory Visit (INDEPENDENT_AMBULATORY_CARE_PROVIDER_SITE_OTHER): Payer: Medicare Other | Admitting: Family Medicine

## 2017-10-11 ENCOUNTER — Other Ambulatory Visit: Payer: Self-pay

## 2017-10-11 VITALS — BP 110/76 | HR 91 | Temp 97.5°F | Resp 16 | Ht 67.0 in | Wt 202.2 lb

## 2017-10-11 DIAGNOSIS — H7201 Central perforation of tympanic membrane, right ear: Secondary | ICD-10-CM

## 2017-10-11 DIAGNOSIS — Z1239 Encounter for other screening for malignant neoplasm of breast: Secondary | ICD-10-CM

## 2017-10-11 DIAGNOSIS — M545 Other chronic pain: Secondary | ICD-10-CM

## 2017-10-11 DIAGNOSIS — G8929 Other chronic pain: Secondary | ICD-10-CM

## 2017-10-11 DIAGNOSIS — J4540 Moderate persistent asthma, uncomplicated: Secondary | ICD-10-CM | POA: Diagnosis not present

## 2017-10-11 DIAGNOSIS — Z1231 Encounter for screening mammogram for malignant neoplasm of breast: Secondary | ICD-10-CM | POA: Diagnosis not present

## 2017-10-11 NOTE — Patient Instructions (Addendum)
You still need a mammogram Everything else is good Stay as active as you can manage  Follow up in June for a PE This will be with Dr Tracie HarrierHagler

## 2017-10-11 NOTE — Progress Notes (Signed)
Chief Complaint  Patient presents with  . Follow-up   Patient is here for routine follow-up. She has not yet had her mammogram.  It was ordered in June 2018.  She has not had one in many years.  She is reminded to get this done. She has chronic low back pain.  She sees a clinic for chronic pain management.  It is reasonably well controlled. Patient has moderate persistent asthma, and allergies.  She has been seen by pulmonary medicine and also by an allergist.  She has periodic flares.  This is a difficult time of year for her.  She is currently wheezing mildly.  She is using her rescue inhaler a couple times a week.  No recent infection. She recently had an ear infection with tympanic membrane rupture.  Her hearing feels normal.  No more ear pain.  Patient Active Problem List   Diagnosis Date Noted  . 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 Chronic insomnia 11/29/2016  . Eosinophilia 03/29/2016  . Moderate persistent asthma, uncomplicated 03/29/2016  . 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 Other social stressor 03/29/2016  . 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 Arthritis of knee, degenerative 07/09/2015  . Chronic low back pain 04/15/2015  . Cough variant asthma 08/09/2014  . Pulmonary infiltrates with eosinophilia (HCC) 08/09/2014    Outpatient Encounter Medications as of 10/11/2017  Medication Sig  . albuterol (PROVENTIL) (2.5 MG/3ML) 0.083% nebulizer solution Nebulize one ampule every 4 hours as needed for cough or wheeze.  . budesonide-formoterol (SYMBICORT) 160-4.5 MCG/ACT inhaler Use 2 puffs twice daily to prevent cough or wheeze.  Rinse, gargle, and spit after use.  . diclofenac (VOLTAREN) 75 MG EC tablet Take 75 mg by mouth 2 (two) times daily with a meal.  . estradiol (ESTRACE) 0.5 MG tablet Take 1 tablet (0.5 mg total) by mouth daily. Taper to every other day as tolerated.  . fluticasone (FLONASE) 50 MCG/ACT nasal spray  Place 2 sprays into the nose 2 (two) times daily.    . montelukast (SINGULAIR) 10 MG tablet Take 1 tablet (10 mg total) by mouth at bedtime.  Marland Kitchen. morphine (MSIR) 15 MG tablet Take 15 mg by mouth every 8 (eight) hours as needed. Back Pain    . pantoprazole (PROTONIX) 40 MG tablet TAKE 1 TABLET BY MOUTH EVERY DAY  . tiZANidine (ZANAFLEX) 4 MG tablet Take 4 mg by mouth 2 (two) times daily as needed.  . traZODone (DESYREL) 50 MG tablet TAKE 1/2 - 1 TAB BY MOUTH AT BEDTIME AS NEEDED FOR SLEEP  . VENTOLIN HFA 108 (90 Base) MCG/ACT inhaler INHALE TWO PUFFS INTO LUNGS EVERY 6 HOURS AS NEEDED FOR WHEEZING OR SHORTNESS OF BREATH  . topiramate (TOPAMAX) 25 MG tablet Take 2 tablets by mouth at bedtime as needed.   No facility-administered encounter medications on file as of 10/11/2017.     No Known Allergies  Review of Systems  Constitutional: Negative for activity change, appetite change and unexpected weight change.  HENT: Negative for congestion, dental problem, postnasal drip and rhinorrhea.   Eyes: Negative for redness and visual disturbance.  Respiratory: Positive for shortness of breath and wheezing. Negative for cough.   Cardiovascular: Negative for chest pain, palpitations and leg swelling.  Gastrointestinal: Negative for abdominal pain, constipation and diarrhea.  Genitourinary: Negative for difficulty urinating, frequency and vaginal bleeding.  Musculoskeletal: Positive for arthralgias, back pain and gait problem.       Arthritis in  knees, chronic back pain.  Skin: Negative for rash.  Neurological: Negative for dizziness and headaches.  Psychiatric/Behavioral: Negative for dysphoric mood and sleep disturbance. The patient is not nervous/anxious.     Physical Exam  Constitutional: She is oriented to person, place, and time. She appears well-developed and well-nourished.  Guarded movement.  stiff  HENT:  Head: Normocephalic and atraumatic.  Right Ear: External ear normal.  Left Ear:  External ear normal.  Mouth/Throat: Oropharynx is clear and moist.  TMs are clear  Eyes: Pupils are equal, round, and reactive to light. Conjunctivae are normal.  Neck: Normal range of motion. Neck supple. No thyromegaly present.  Cardiovascular: Normal rate, regular rhythm and normal heart sounds.  Pulmonary/Chest: Effort normal. No respiratory distress. She has wheezes.  Scattered  Musculoskeletal: Normal range of motion. She exhibits no edema.  Lymphadenopathy:    She has no cervical adenopathy.  Neurological: She is alert and oriented to person, place, and time.  Mildly antalgic  Skin: Skin is warm and dry.  Psychiatric: She has a normal mood and affect. Her behavior is normal. Thought content normal.  Nursing note and vitals reviewed.   BP 110/76 (BP Location: Left Arm, Patient Position: Sitting, Cuff Size: Normal)   Pulse 91   Temp (!) 97.5 F (36.4 C) (Temporal)   Resp 16   Ht 5\' 7"  (1.702 m)   Wt 202 lb 4 oz (91.7 kg)   LMP 08/30/2002 (Approximate)   SpO2 94%   BMI 31.68 kg/m     ASSESSMENT/PLAN:  1. Tympanic membrane central perforation, right Healed  2. Moderate persistent asthma, uncomplicated Moderately well controlled.  Still with persistent wheezing.  3. Chronic midline low back pain without sciatica Chronic.  Limits patient's exercise ability.  Under care of specialty  4. Screening for breast cancer Overdue.  Discussed with patient. - MM Digital Screening; Future   Patient Instructions  You still need a mammogram Everything else is good Stay as active as you can manage  Follow up in June for a PE This will be with Dr Tracie Harrier     Eustace Moore, MD

## 2017-11-28 ENCOUNTER — Other Ambulatory Visit: Payer: Self-pay | Admitting: Family Medicine

## 2017-11-29 ENCOUNTER — Encounter: Payer: Self-pay | Admitting: Family Medicine

## 2017-11-29 ENCOUNTER — Ambulatory Visit (INDEPENDENT_AMBULATORY_CARE_PROVIDER_SITE_OTHER): Payer: Medicare Other | Admitting: Family Medicine

## 2017-11-29 ENCOUNTER — Other Ambulatory Visit: Payer: Self-pay

## 2017-11-29 VITALS — BP 110/72 | HR 74 | Temp 97.8°F | Resp 16 | Ht 67.0 in | Wt 193.1 lb

## 2017-11-29 DIAGNOSIS — K219 Gastro-esophageal reflux disease without esophagitis: Secondary | ICD-10-CM

## 2017-11-29 DIAGNOSIS — E782 Mixed hyperlipidemia: Secondary | ICD-10-CM

## 2017-11-29 DIAGNOSIS — Z23 Encounter for immunization: Secondary | ICD-10-CM | POA: Diagnosis not present

## 2017-11-29 DIAGNOSIS — D721 Eosinophilia, unspecified: Secondary | ICD-10-CM

## 2017-11-29 DIAGNOSIS — R5383 Other fatigue: Secondary | ICD-10-CM | POA: Diagnosis not present

## 2017-11-29 DIAGNOSIS — Z79899 Other long term (current) drug therapy: Secondary | ICD-10-CM

## 2017-11-29 DIAGNOSIS — R1310 Dysphagia, unspecified: Secondary | ICD-10-CM

## 2017-11-29 DIAGNOSIS — G479 Sleep disorder, unspecified: Secondary | ICD-10-CM

## 2017-11-29 DIAGNOSIS — R739 Hyperglycemia, unspecified: Secondary | ICD-10-CM

## 2017-11-29 DIAGNOSIS — J4540 Moderate persistent asthma, uncomplicated: Secondary | ICD-10-CM

## 2017-11-29 DIAGNOSIS — E559 Vitamin D deficiency, unspecified: Secondary | ICD-10-CM

## 2017-11-29 MED ORDER — ALBUTEROL SULFATE HFA 108 (90 BASE) MCG/ACT IN AERS: INHALATION_SPRAY | RESPIRATORY_TRACT | 6 refills | Status: DC

## 2017-11-29 MED ORDER — ALBUTEROL SULFATE (2.5 MG/3ML) 0.083% IN NEBU: INHALATION_SOLUTION | RESPIRATORY_TRACT | 2 refills | Status: DC

## 2017-11-29 NOTE — Progress Notes (Signed)
Patient ID: [name], female    DOB: 09/06/1960, 57 y.o.   MRN: [id number]  Chief Complaint  Patient presents with  . Establish Care    Former [facility] patient    Allergies Patient has no known allergies.  Subjective:   [name] is a 57 y.o. female who presents to [facility] today.  HPI Ms. Agent presents for a visit today to establish care.  She is previously been seen by Dr. Lily Peer.  She reports that she needs a refill on some of her medications today.  Has a history of asthma and allergies which she reports did not start until later in life.  She reports she has never been intubated or hospitalized due to her asthma.  She has had to go to the ED occasionally for exacerbations.  She is currently using Symbicort, Singulair, Zyrtec, and albuterol.  She reports that she only uses her albuterol sporadically and less than 1 time a week.  She denies any cough or shortness of breath.  She reports that she can hear herself wheeze.  She needs a refill on her albuterol inhaler and nebulizer solution today.  She did not follow-up with her allergist/pulmonary doctor last year.  She would like to follow-up.  She has not had spirometry testing done in over a year.  She reports that she does not sleep well.  Reports that she has been given trazodone by Dr. Delton See in the past but it does not really seem to work.  Reports that she was on Ambien for many years by her previous PCP.  Reports that she has had issues with sleep her whole life.  She reports that she is only able to sleep 3 to 4 hours at night without waking up.  She reports that she does have excessive sleepiness during the daytime and has even fallen asleep while driving in the past.  She reports that she can fall asleep while talking with others.  She denies any snoring.  She has never been seen by a sleep medicine physician or had this evaluated in the past.  She has no known history of sleep disordered breathing.  She  reports that she has had heartburn for many years.  Heartburn comes and goes.  She has been on PPI for 8-9 years.  She reports that if she stops the PPI that the heartburn symptoms come back.  She believes she was placed on this by her asthma doctor.  She has never had an endoscopy.  She reports that over the past many months that food feels like it gets stuck in her throat and she has a hard time swallowing it down.  She denies any choking.  She denies any current heartburn.  Patient reports that her energy level is low.  She does not feel sad, down, or depressed.  She reports her mood is good.  Appetite is good.  Reports that she has gained some weight.  Has chronic knee pain and is followed by orthopedics.  She is also seen by Dr. Noel Gerold for scoliosis/chronic back pain.  She does take morphine and Voltaren.  She reports that she has been on these medications long-term and has been told of the possible GI and kidney side effects associated with chronic NSAID use.   Past Medical History:  Diagnosis Date  . Arthritis    spine, R knee  . Asthma   . Back pain   . Blood transfusion without reported diagnosis  back surgery  . Cough 09/03/2014   Followed in Pulmonary clinic/ [facility]/ [place] - sinus CT 09/08/2014> Mild mucosal thickening involves the paranasal sinuses  - Eos 1.2  08/06/14 - Allergy profile 11/27/14 >  Eos 1.0/ IgE  41 with neg RAST    . GERD (gastroesophageal reflux disease)     Past Surgical History:  Procedure Laterality Date  . ABDOMINAL HYSTERECTOMY     "cysts", pelvic pain  . BACK SURGERY    . KNEE ARTHROSCOPY Right 2017  . SPINE SURGERY  2010   fusion  . SPINE SURGERY  2014   remove hardware    Family History  Problem Relation Age of Onset  . Emphysema Maternal Grandmother        never smoker  . COPD Maternal Grandmother   . Emphysema Maternal Uncle        smoked  . COPD Maternal Uncle   . Arthritis Mother   . Diabetes Mother   . Hyperlipidemia Mother   .  Hypertension Mother   . Early death Father 19       MVA  . Asthma Sister   . Asthma Brother   . Heart disease Maternal Grandfather      Social History   Socioeconomic History  . Marital status: Significant Other    Spouse name: Clide Cliff  . Number of children: 2  . Years of education: 52  . Highest education level: Not on file  Occupational History  . Occupation: disabled    Comment: back  Social Needs  . Financial resource strain: Not on file  . Food insecurity:    Worry: Not on file    Inability: Not on file  . Transportation needs:    Medical: Not on file    Non-medical: Not on file  Tobacco Use  . Smoking status: Never Smoker  . Smokeless tobacco: Never Used  Substance and Sexual Activity  . Alcohol use: No  . Drug use: No  . Sexual activity: Yes    Birth control/protection: Surgical  Lifestyle  . Physical activity:    Days per week: Not on file    Minutes per session: Not on file  . Stress: Not on file  Relationships  . Social connections:    Talks on phone: Not on file    Gets together: Not on file    Attends religious service: Not on file    Active member of club or organization: Not on file    Attends meetings of clubs or organizations: Not on file    Relationship status: Not on file  Other Topics Concern  . Not on file  Social History Narrative   Lives with Clide Cliff - 27 years   Clide Cliff has end stage colon cancer   Two sons/ grandchildren   Disabled from back pain/had scoliosis. Has had back reconstruction.    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    Current Outpatient Medications on File Prior to Visit  Medication Sig Dispense Refill  . diclofenac (VOLTAREN) 75 MG EC tablet Take 75 mg by mouth 2 (two) times daily with a meal.  2  . estradiol (ESTRACE) 0.5 MG tablet Take 1 tablet (0.5 mg total) by mouth daily. Taper to every other day as tolerated. 90 tablet 1  . fluticasone (FLONASE) 50 MCG/ACT nasal spray Place 2 sprays into the nose 2 (two) times daily.      .  montelukast (SINGULAIR) 10 MG tablet Take 1 tablet (10 mg total) by mouth at bedtime. 90  tablet 3  . morphine (MSIR) 15 MG tablet Take 15 mg by mouth every 8 (eight) hours as needed. Back Pain      . pantoprazole (PROTONIX) 40 MG tablet TAKE 1 TABLET BY MOUTH EVERY DAY 30 tablet 6  . tiZANidine (ZANAFLEX) 4 MG tablet Take 4 mg by mouth 2 (two) times daily as needed.    . traZODone (DESYREL) 50 MG tablet TAKE 1/2 - 1 TAB BY MOUTH AT BEDTIME AS NEEDED FOR SLEEP 90 tablet 0  . budesonide-formoterol (SYMBICORT) 160-4.5 MCG/ACT inhaler INHALE TWO PUFFS TWICE DAILY TO PREVENT COUGH OR WHEEZING. RINSE, GARGLE AND SPIT AFTER EACH USE 10.2 g 5   No current facility-administered medications on file prior to visit.     Review of Systems  Constitutional: Negative for activity change, appetite change and fever.  HENT: Negative for trouble swallowing and voice change.   Eyes: Negative for visual disturbance.  Respiratory: Negative for cough, chest tightness and shortness of breath.   Cardiovascular: Negative for chest pain, palpitations and leg swelling.  Gastrointestinal: Negative for abdominal pain, nausea and vomiting.  Genitourinary: Negative for dysuria, frequency and urgency.  Musculoskeletal: Positive for back pain. Negative for myalgias and neck stiffness.  Skin: Negative for rash.  Neurological: Negative for dizziness, syncope and light-headedness.  Hematological: Negative for adenopathy.  Psychiatric/Behavioral: Positive for sleep disturbance. Negative for agitation, behavioral problems, decreased concentration, dysphoric mood and suicidal ideas. The patient is not nervous/anxious.      Objective:   BP 110/72 (BP Location: Right Arm, Patient Position: Sitting, Cuff Size: Large)   Pulse 74   Temp 97.8 F (36.6 C) (Temporal)   Resp 16   Ht 5\' 7"  (1.702 m)   Wt 193 lb 1.3 oz (87.6 kg)   LMP 08/30/2002 (Approximate)   SpO2 95%   BMI 30.24 kg/m   Physical Exam  Constitutional: She is  oriented to person, place, and time. She appears well-developed and well-nourished. No distress.  HENT:  Head: Normocephalic and atraumatic.  Eyes: Pupils are equal, round, and reactive to light. Conjunctivae are normal. No scleral icterus.  Neck: Normal range of motion. Neck supple. No JVD present. No tracheal deviation present. No thyromegaly present.  Cardiovascular: Normal rate, regular rhythm and normal heart sounds.  Pulmonary/Chest: Effort normal. No accessory muscle usage or stridor. No tachypnea. No respiratory distress. She has wheezes.  Scattered wheezes present throughout lung fields.  Abdominal: Soft. Bowel sounds are normal.  Lymphadenopathy:    She has no cervical adenopathy.  Neurological: She is alert and oriented to person, place, and time. No cranial nerve deficit.  Skin: Skin is warm and dry.  Psychiatric: She has a normal mood and affect. Her behavior is normal. Judgment and thought content normal.  Nursing note and vitals reviewed.  Depression screen [facility] 2/9 11/29/2017 05/31/2017 11/29/2016  Decreased Interest 0 0 0  Down, Depressed, Hopeless 0 0 0  PHQ - 2 Score 0 0 0    Assessment and Plan  1. Moderate persistent asthma, uncomplicated Patient is recommended to follow-up with her allergist/asthma doctor.  She does have a wheezes scattered today.  She is asymptomatic.  We did discuss that despite not having respiratory symptoms that her wheezing could be evidence of pulmonary dysfunction/inflammatory process going on in her lungs.  Referral placed. - albuterol (VENTOLIN HFA) 108 (90 Base) MCG/ACT inhaler; INHALE TWO PUFFS INTO LUNGS EVERY 6 HOURS AS NEEDED FOR WHEEZING OR SHORTNESS OF BREATH  Dispense: 18 g; Refill: 6 - albuterol (  PROVENTIL) (2.5 MG/3ML) 0.083% nebulizer solution; Nebulize one ampule every 4 hours as needed for cough or wheeze.  Dispense: 60 vial; Refill: 2 - Ambulatory referral to Allergy - Pneumococcal conjugate vaccine 13-valent  2. Disturbance in  sleep behavior Referral to Dr. Vickey Hugerohmeier for sleep evaluation. - Ambulatory referral to Neurology Patient was counseled regarding driving risks of when feeling tired. 3. Dysphagia, unspecified type - Ambulatory referral to Gastroenterology Patient was encouraged to all her food and eat slowly.  Referral placed to gastroenterology. 4. Gastroesophageal reflux disease without esophagitis Continue PPI medication and follow-up with gastroenterology.  Referral placed today.  5. Vitamin D deficiency Check levels.  6. Mixed hyperlipidemia History of hypertriglycerides and LDL elevated.  Check today. Diet, exercise, and lifestyle modifications recommended. - Lipid panel  7. Hyperglycemia Family history of diabetes and personal history of elevated blood sugars.  Screen for diabetes today. - Hemoglobin A1c  8. High risk medication use Check labs due to high risk medications. - COMPLETE METABOLIC PANEL WITH GFR  9. Eosinophilia Check CBC and follow-up with asthma and allergy.  Patient has had a high eosinophil count despite being on antihistamines and leukotriene inhibitors. - CBC with Differential/Platelet  10. Fatigue, unspecified type Check labs to rule out cause of fatigue.  Possibly secondary to sleep disorder. - VITAMIN D 25 Hydroxy (Vit-D Deficiency, Fractures) - TSH  Return in about 2 months (around 01/29/2018) for CPE. Aliene Beamsachel Genesys Coggeshall, MD 11/29/2017

## 2017-11-29 NOTE — Patient Instructions (Signed)
Schedule mammogram Follow up with Sleep doctor Follow up with GI/Dr. Karilyn Cotaehman Follow up with Allergy/asthma/Dr. Dellis AnesGallagher

## 2017-11-30 ENCOUNTER — Encounter: Payer: Self-pay | Admitting: Family Medicine

## 2017-12-01 ENCOUNTER — Encounter: Payer: Self-pay | Admitting: Family Medicine

## 2017-12-11 ENCOUNTER — Encounter (INDEPENDENT_AMBULATORY_CARE_PROVIDER_SITE_OTHER): Payer: Self-pay | Admitting: Internal Medicine

## 2017-12-11 ENCOUNTER — Ambulatory Visit (INDEPENDENT_AMBULATORY_CARE_PROVIDER_SITE_OTHER): Payer: Medicare Other | Admitting: Internal Medicine

## 2017-12-11 VITALS — BP 130/74 | HR 60 | Temp 97.8°F | Ht 67.0 in | Wt 194.8 lb

## 2017-12-11 DIAGNOSIS — R1310 Dysphagia, unspecified: Secondary | ICD-10-CM

## 2017-12-11 DIAGNOSIS — R1319 Other dysphagia: Secondary | ICD-10-CM

## 2017-12-11 MED ORDER — PANTOPRAZOLE SODIUM 40 MG PO TBEC: 40.0000 mg | DELAYED_RELEASE_TABLET | Freq: Every day | ORAL | 3 refills | Status: DC

## 2017-12-11 NOTE — Progress Notes (Signed)
Subjective:    Patient ID: [name], female    DOB: 08/09/1960, 57 y.o.   MRN: [id number]  HPI Referred by Dr. Tracie HarrierHagler for dysphagia. She says foods are lodging in her esophagus. Dysphagia for a year or more. If she watches what she eat, she does not have it. Sometimes she has trouble swallowing liquids.  No foods in particular her.  GERD occasionally and takes Protonix as needed. Appetite is good. No weight loss.  She has never undergone a colonoscopy. She underwent a cologuard and was normal in 2018.   Takes Morphine 15mg  for chronic back pain.   Review of Systems Past Medical History:  Diagnosis Date  . Arthritis    spine, R knee  . Asthma   . Back pain   . Blood transfusion without reported diagnosis    back surgery  . Cough 09/03/2014   Followed in Pulmonary clinic/ [facility]/ [place] - sinus CT 09/08/2014> Mild mucosal thickening involves the paranasal sinuses  - Eos 1.2  08/06/14 - Allergy profile 11/27/14 >  Eos 1.0/ IgE  41 with neg RAST    . GERD (gastroesophageal reflux disease)     Past Surgical History:  Procedure Laterality Date  . ABDOMINAL HYSTERECTOMY     "cysts", pelvic pain  . BACK SURGERY    . KNEE ARTHROSCOPY Right 2017  . SPINE SURGERY  2010   fusion  . SPINE SURGERY  2014   remove hardware    No Known Allergies  Current Outpatient Medications on File Prior to Visit  Medication Sig Dispense Refill  . albuterol (PROVENTIL) (2.5 MG/3ML) 0.083% nebulizer solution Nebulize one ampule every 4 hours as needed for cough or wheeze. 60 vial 2  . albuterol (VENTOLIN HFA) 108 (90 Base) MCG/ACT inhaler INHALE TWO PUFFS INTO LUNGS EVERY 6 HOURS AS NEEDED FOR WHEEZING OR SHORTNESS OF BREATH 18 g 6  . budesonide-formoterol (SYMBICORT) 160-4.5 MCG/ACT inhaler INHALE TWO PUFFS TWICE DAILY TO PREVENT COUGH OR WHEEZING. RINSE, GARGLE AND SPIT AFTER EACH USE 10.2 g 5  . diclofenac (VOLTAREN) 75 MG EC tablet Take 75 mg by mouth 2 (two) times daily with a meal.  2   . fluticasone (FLONASE) 50 MCG/ACT nasal spray Place 2 sprays into the nose 2 (two) times daily.      . montelukast (SINGULAIR) 10 MG tablet Take 1 tablet (10 mg total) by mouth at bedtime. 90 tablet 3  . morphine (MSIR) 15 MG tablet Take 15 mg by mouth every 8 (eight) hours as needed. Back Pain      . pantoprazole (PROTONIX) 40 MG tablet TAKE 1 TABLET BY MOUTH EVERY DAY (Patient taking differently: occasionally) 30 tablet 6  . tiZANidine (ZANAFLEX) 4 MG tablet Take 4 mg by mouth 2 (two) times daily as needed.    . traZODone (DESYREL) 50 MG tablet TAKE 1/2 - 1 TAB BY MOUTH AT BEDTIME AS NEEDED FOR SLEEP 90 tablet 0   No current facility-administered medications on file prior to visit.         Objective:   Physical Exam Blood pressure 130/74, pulse 60, temperature 97.8 F (36.6 C), height 5\' 7"  (1.702 m), weight 194 lb 12.8 oz (88.4 kg), last menstrual period 08/30/2002. Alert and oriented. Skin warm and dry. Oral mucosa is moist.   . Sclera anicteric, conjunctivae is pink. Thyroid not enlarged. No cervical lymphadenopathy. Lungs clear. Heart regular rate and rhythm.  Abdomen is soft. Bowel sounds are positive. No hepatomegaly. No abdominal masses  felt. No tenderness.  No edema to lower extremities.          Assessment & Plan:  Dysphagia: Will set up for a DG esophagus.  Take the Protonix daily 30 minutes before bereakfast.  Further recommendations to follow.

## 2017-12-11 NOTE — Patient Instructions (Addendum)
Protonix 40mg  daily.  DG esophagram.

## 2017-12-12 ENCOUNTER — Other Ambulatory Visit: Payer: Self-pay | Admitting: Family Medicine

## 2017-12-13 ENCOUNTER — Encounter: Payer: Self-pay | Admitting: Allergy

## 2017-12-14 ENCOUNTER — Ambulatory Visit (HOSPITAL_COMMUNITY)
Admission: RE | Admit: 2017-12-14 | Discharge: 2017-12-14 | Disposition: A | Discharge status: Home / Self Care | Payer: Medicare Other | Source: Ambulatory Visit | Attending: Family Medicine | Admitting: Family Medicine

## 2017-12-14 DIAGNOSIS — Z1231 Encounter for screening mammogram for malignant neoplasm of breast: Secondary | ICD-10-CM | POA: Insufficient documentation

## 2017-12-14 DIAGNOSIS — Z1239 Encounter for other screening for malignant neoplasm of breast: Secondary | ICD-10-CM

## 2017-12-15 ENCOUNTER — Ambulatory Visit (HOSPITAL_COMMUNITY): Payer: Self-pay

## 2017-12-15 LAB — COMPLETE METABOLIC PANEL WITH GFR
AG RATIO: 1.6 (calc) (ref 1.0–2.5)
ALT: 36 U/L — AB (ref 6–29)
AST: 23 U/L (ref 10–35)
Albumin: 3.9 g/dL (ref 3.6–5.1)
Alkaline phosphatase (APISO): 104 U/L (ref 33–130)
BUN: 19 mg/dL (ref 7–25)
CALCIUM: 9.3 mg/dL (ref 8.6–10.4)
CO2: 30 mmol/L (ref 20–32)
CREATININE: 0.67 mg/dL (ref 0.50–1.05)
Chloride: 105 mmol/L (ref 98–110)
GFR, EST AFRICAN AMERICAN: 115 mL/min/{1.73_m2} (ref >=60)
GFR, Est Non African American: 100 mL/min/{1.73_m2} (ref >=60)
GLOBULIN: 2.5 g/dL (ref 1.9–3.7)
Glucose, Bld: 91 mg/dL (ref 65–99)
Potassium: 4.2 mmol/L (ref 3.5–5.3)
Sodium: 141 mmol/L (ref 135–146)
Total Bilirubin: 0.5 mg/dL (ref 0.2–1.2)
Total Protein: 6.4 g/dL (ref 6.1–8.1)

## 2017-12-15 LAB — CBC WITH DIFFERENTIAL/PLATELET
Basophils Absolute: 42 cells/uL (ref 0–200)
Basophils Relative: 0.8 %
EOS PCT: 6 %
Eosinophils Absolute: 312 cells/uL (ref 15–500)
HCT: 39.1 % (ref 35.0–45.0)
Hemoglobin: 13.6 g/dL (ref 11.7–15.5)
Lymphs Abs: 2075 cells/uL (ref 850–3900)
MCH: 30.3 pg (ref 27.0–33.0)
MCHC: 34.8 g/dL (ref 32.0–36.0)
MCV: 87.1 fL (ref 80.0–100.0)
MPV: 10.1 fL (ref 7.5–12.5)
Monocytes Relative: 9.7 %
NEUTROS PCT: 43.6 %
Neutro Abs: 2267 cells/uL (ref 1500–7800)
PLATELETS: 215 10*3/uL (ref 140–400)
RBC: 4.49 10*6/uL (ref 3.80–5.10)
RDW: 12.8 % (ref 11.0–15.0)
TOTAL LYMPHOCYTE: 39.9 %
WBC mixed population: 504 cells/uL (ref 200–950)
WBC: 5.2 10*3/uL (ref 3.8–10.8)

## 2017-12-15 LAB — HEMOGLOBIN A1C
EAG (MMOL/L): 6 (calc)
HEMOGLOBIN A1C: 5.4 %{Hb} (ref <5.7)
MEAN PLASMA GLUCOSE: 108 (calc)

## 2017-12-15 LAB — LIPID PANEL
CHOL/HDL RATIO: 4.2 (calc) (ref <5.0)
Cholesterol: 171 mg/dL (ref <200)
HDL: 41 mg/dL — ABNORMAL LOW (ref >50)
LDL Cholesterol (Calc): 98 mg/dL (calc)
NON-HDL CHOLESTEROL (CALC): 130 mg/dL — AB (ref <130)
Triglycerides: 196 mg/dL — ABNORMAL HIGH (ref <150)

## 2017-12-15 LAB — VITAMIN D 25 HYDROXY (VIT D DEFICIENCY, FRACTURES): VIT D 25 HYDROXY: 36 ng/mL (ref 30–100)

## 2017-12-15 LAB — TSH: TSH: 2.12 m[IU]/L

## 2017-12-20 ENCOUNTER — Ambulatory Visit (HOSPITAL_COMMUNITY)
Admission: RE | Admit: 2017-12-20 | Discharge: 2017-12-20 | Disposition: A | Discharge status: Home / Self Care | Payer: Medicare Other | Source: Ambulatory Visit | Attending: Internal Medicine | Admitting: Internal Medicine

## 2017-12-20 DIAGNOSIS — R1319 Other dysphagia: Secondary | ICD-10-CM

## 2017-12-20 DIAGNOSIS — K449 Diaphragmatic hernia without obstruction or gangrene: Secondary | ICD-10-CM | POA: Insufficient documentation

## 2017-12-20 DIAGNOSIS — R1310 Dysphagia, unspecified: Secondary | ICD-10-CM | POA: Diagnosis not present

## 2018-01-31 ENCOUNTER — Other Ambulatory Visit: Payer: Self-pay

## 2018-01-31 ENCOUNTER — Ambulatory Visit (INDEPENDENT_AMBULATORY_CARE_PROVIDER_SITE_OTHER): Payer: Medicare Other | Admitting: Family Medicine

## 2018-01-31 ENCOUNTER — Encounter: Payer: Self-pay | Admitting: Family Medicine

## 2018-01-31 VITALS — BP 124/70 | HR 60 | Temp 98.2°F | Resp 12 | Ht 67.0 in | Wt 187.1 lb

## 2018-01-31 DIAGNOSIS — J45909 Unspecified asthma, uncomplicated: Secondary | ICD-10-CM | POA: Diagnosis not present

## 2018-01-31 DIAGNOSIS — J3089 Other allergic rhinitis: Secondary | ICD-10-CM

## 2018-01-31 DIAGNOSIS — J209 Acute bronchitis, unspecified: Secondary | ICD-10-CM | POA: Diagnosis not present

## 2018-01-31 DIAGNOSIS — J4540 Moderate persistent asthma, uncomplicated: Secondary | ICD-10-CM | POA: Diagnosis not present

## 2018-01-31 MED ORDER — DOXYCYCLINE HYCLATE 100 MG PO TABS: 100.0000 mg | ORAL_TABLET | Freq: Two times a day (BID) | ORAL | 0 refills | Status: DC

## 2018-01-31 MED ORDER — PREDNISONE 20 MG PO TABS: ORAL_TABLET | ORAL | 0 refills | Status: DC

## 2018-01-31 MED ORDER — LEVOCETIRIZINE DIHYDROCHLORIDE 5 MG PO TABS: 5.0000 mg | ORAL_TABLET | Freq: Every evening | ORAL | 1 refills | Status: DC

## 2018-01-31 NOTE — Progress Notes (Signed)
Patient ID: [name], female    DOB: 12-24-1960, 57 y.o.   MRN: [id number]  Chief Complaint  Patient presents with  . Cough    Allergies Patient has no known allergies.  Subjective:   Michelle CASSTEVENS is a 57 y.o. female who presents to [facility] today.  HPI Here for a cough. Has not been to see her pulmonary doctor yet.  Has had a cough for 2 weeks, coughing up sputum, clear. No sinus pain or pressure. Is currently on amoxicillin for dental issues, but only has 1 dose left. Has a lot of post nasal drip. Is not on any anti-histamine.  Has been wheezing.  Using her albuterol as directed.  Does feel some wheezing.  Reports she does not feel short of breath.  No fevers, chills, nausea, vomiting, diarrhea.  Reports that she gets bronchitis several times a year and ends up on steroids and antibiotics.  Does not smoke.  Nothing seems to make the cough much better.  Reports her symptoms do get worse if she gets out in the yard and is exposed to grasses and allergens.  Was recommended to have allergy shots in the past but deferred.  Has followed up with gastroenterology and is now on her PPI daily.  Denies any reflux symptoms.  Is also gotten her mammogram done.  Has not been seen by neurology for sleep yet.  Patient has lost some weight and is been working on her diet.  She reports her energy level is good.  Still has her chronic knee pain and is followed by orthopedics.  Cough  This is a new problem. The current episode started 1 to 4 weeks ago. The problem has been gradually worsening. The problem occurs every few hours. The cough is productive of sputum. Associated symptoms include nasal congestion, postnasal drip and wheezing. Pertinent negatives include no chest pain, chills, ear congestion, ear pain, fever, headaches, heartburn, hemoptysis, myalgias, rash, rhinorrhea, sore throat, shortness of breath, sweats or weight loss. The symptoms are aggravated by dust, exercise and pollens.  She has tried a beta-agonist inhaler, leukotriene antagonists, steroid inhaler and rest for the symptoms. The treatment provided no relief. Her past medical history is significant for asthma, bronchitis, environmental allergies and pneumonia.    Past Medical History:  Diagnosis Date  . Arthritis    spine, R knee  . Asthma   . Back pain   . Blood transfusion without reported diagnosis    back surgery  . Cough 09/03/2014   Followed in Pulmonary clinic/ [facility]/ [place] - sinus CT 09/08/2014> Mild mucosal thickening involves the paranasal sinuses  - Eos 1.2  08/06/14 - Allergy profile 11/27/14 >  Eos 1.0/ IgE  41 with neg RAST    . GERD (gastroesophageal reflux disease)     Past Surgical History:  Procedure Laterality Date  . ABDOMINAL HYSTERECTOMY     "cysts", pelvic pain  . BACK SURGERY    . KNEE ARTHROSCOPY Right 2017  . SPINE SURGERY  2010   fusion  . SPINE SURGERY  2014   remove hardware    Family History  Problem Relation Age of Onset  . Emphysema Maternal Grandmother        never smoker  . COPD Maternal Grandmother   . Emphysema Maternal Uncle        smoked  . COPD Maternal Uncle   . Arthritis Mother   . Diabetes Mother   . Hyperlipidemia Mother   .  Hypertension Mother   . Early death Father 19       MVA  . Asthma Sister   . Asthma Brother   . Heart disease Maternal Grandfather      Social History   Socioeconomic History  . Marital status: Significant Other    Spouse name: Clide Cliff  . Number of children: 2  . Years of education: 6  . Highest education level: Not on file  Occupational History  . Occupation: disabled    Comment: back  Social Needs  . Financial resource strain: Not on file  . Food insecurity:    Worry: Not on file    Inability: Not on file  . Transportation needs:    Medical: Not on file    Non-medical: Not on file  Tobacco Use  . Smoking status: Never Smoker  . Smokeless tobacco: Never Used  Substance and Sexual Activity  .  Alcohol use: No  . Drug use: No  . Sexual activity: Yes    Birth control/protection: Surgical  Lifestyle  . Physical activity:    Days per week: Not on file    Minutes per session: Not on file  . Stress: Not on file  Relationships  . Social connections:    Talks on phone: Not on file    Gets together: Not on file    Attends religious service: Not on file    Active member of club or organization: Not on file    Attends meetings of clubs or organizations: Not on file    Relationship status: Not on file  Other Topics Concern  . Not on file  Social History Narrative   Lives with Clide Cliff - 27 years   Clide Cliff has end stage colon cancer   Two sons/ grandchildren   Disabled from back pain/had scoliosis. Has had back reconstruction.    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    Current Outpatient Medications on File Prior to Visit  Medication Sig Dispense Refill  . albuterol (PROVENTIL) (2.5 MG/3ML) 0.083% nebulizer solution Nebulize one ampule every 4 hours as needed for cough or wheeze. 60 vial 2  . albuterol (VENTOLIN HFA) 108 (90 Base) MCG/ACT inhaler INHALE TWO PUFFS INTO LUNGS EVERY 6 HOURS AS NEEDED FOR WHEEZING OR SHORTNESS OF BREATH 18 g 6  . amoxicillin (AMOXIL) 500 MG capsule Take 1 capsule by mouth 4 (four) times daily.  0  . budesonide-formoterol (SYMBICORT) 160-4.5 MCG/ACT inhaler INHALE TWO PUFFS TWICE DAILY TO PREVENT COUGH OR WHEEZING. RINSE, GARGLE AND SPIT AFTER EACH USE 10.2 g 5  . diclofenac (VOLTAREN) 75 MG EC tablet Take 75 mg by mouth 2 (two) times daily with a meal.  2  . fluticasone (FLONASE) 50 MCG/ACT nasal spray Place 2 sprays into the nose 2 (two) times daily.      Marland Kitchen ibuprofen (ADVIL,MOTRIN) 800 MG tablet Take 800 mg by mouth every 8 (eight) hours as needed. Pain  0  . montelukast (SINGULAIR) 10 MG tablet Take 1 tablet (10 mg total) by mouth at bedtime. 90 tablet 3  . morphine (MSIR) 15 MG tablet Take 15 mg by mouth every 8 (eight) hours as needed. Back Pain      . pantoprazole  (PROTONIX) 40 MG tablet TAKE 1 TABLET BY MOUTH EVERY DAY (Patient taking differently: occasionally) 30 tablet 6  . pantoprazole (PROTONIX) 40 MG tablet Take 1 tablet (40 mg total) by mouth daily. 90 tablet 3  . tiZANidine (ZANAFLEX) 4 MG tablet Take 4 mg by mouth 2 (two) times  daily as needed.    . traZODone (DESYREL) 50 MG tablet TAKE 1/2 TO 1 TABLET BY MOUTH AT BEDTIME AS NEEDED FOR SLEEP 90 tablet 0   No current facility-administered medications on file prior to visit.     Review of Systems  Constitutional: Negative for appetite change, chills, diaphoresis, fatigue, fever and weight loss.  HENT: Positive for postnasal drip. Negative for ear pain, rhinorrhea and sore throat.   Respiratory: Positive for cough and wheezing. Negative for hemoptysis, choking, chest tightness, shortness of breath and stridor.   Cardiovascular: Negative for chest pain.  Gastrointestinal: Negative for heartburn.  Musculoskeletal: Negative for myalgias.  Skin: Negative for rash.  Allergic/Immunologic: Positive for environmental allergies.  Neurological: Negative for headaches.     Objective:   BP 124/70 (BP Location: Left Arm, Patient Position: Sitting, Cuff Size: Large)   Pulse 60   Temp 98.2 F (36.8 C) (Temporal)   Resp 12   Ht 5\' 7"  (1.702 m)   Wt 187 lb 1.9 oz (84.9 kg)   LMP 08/30/2002 (Approximate)   SpO2 94% Comment: room air  BMI 29.31 kg/m   Physical Exam  Constitutional: She is oriented to person, place, and time. She appears well-developed and well-nourished. No distress.  HENT:  Head: Normocephalic and atraumatic.  Eyes: Pupils are equal, round, and reactive to light.  Neck: Normal range of motion. Neck supple. No thyromegaly present.  Cardiovascular: Normal rate, regular rhythm and normal heart sounds.  Pulmonary/Chest: Effort normal. No stridor. No respiratory distress. She has wheezes. She has no rales. She exhibits no tenderness.  Neurological: She is alert and oriented to  person, place, and time. No cranial nerve deficit.  Skin: Skin is warm and dry. Capillary refill takes less than 2 seconds.  Psychiatric: She has a normal mood and affect. Her behavior is normal.  Nursing note and vitals reviewed.    Assessment and Plan  1. Bronchitis with asthma, acute Patient counseled regarding bronchitis and asthma exacerbation.  Treat with prednisone 40 mg for 5 days as directed.  Counseled regarding risk versus benefits of prednisone.  Take medication with food.  Start doxycycline as directed.  Call with any questions, concerns, increased wheezing, or other worrisome side effects.  Albuterol MDI and albuterol nebulizer as directed. - predniSONE (DELTASONE) 20 MG tablet; Take two pills a day for five days.  Dispense: 10 tablet; Refill: 0 - doxycycline (VIBRA-TABS) 100 MG tablet; Take 1 tablet (100 mg total) by mouth 2 (two) times daily.  Dispense: 20 tablet; Refill: 0 2. Moderate persistent asthma, uncomplicated Continue Symbicort as directed. Patient agrees to call pulmonology to schedule appointment.  Referral was placed at last visit. Pneumovax vaccinations up-to-date. 3. Environmental and seasonal allergies Add antihistamine at this time secondary to postnasal drip and symptoms.  Continue Singulair.  Discuss with pulmonologist possible allergy shots. - levocetirizine (XYZAL) 5 MG tablet; Take 1 tablet (5 mg total) by mouth every evening.  Dispense: 90 tablet; Refill: 1  No follow-ups on file.  Follow-up with pulmonology as directed.  Keep scheduled appointment with sleep/neurology.  Follow-up with new PCP and 1 to 2 months or sooner if needed.  Patient was told to call our office if her symptoms are not completely resolved or if they are getting worse. Aliene Beamsachel Magenta Schmiesing, MD 01/31/2018

## 2018-02-06 ENCOUNTER — Institutional Professional Consult (permissible substitution): Payer: Medicare Other | Admitting: Neurology

## 2018-03-08 ENCOUNTER — Other Ambulatory Visit: Payer: Self-pay

## 2018-03-08 MED ORDER — TRAZODONE HCL 50 MG PO TABS: 25.0000 mg | ORAL_TABLET | Freq: Every evening | ORAL | 0 refills | Status: DC | PRN

## 2018-03-08 MED ORDER — FLUTICASONE PROPIONATE 50 MCG/ACT NA SUSP: 2.0000 | Freq: Two times a day (BID) | NASAL | 0 refills | Status: DC

## 2018-03-20 ENCOUNTER — Encounter: Payer: Self-pay | Admitting: Neurology

## 2018-03-27 ENCOUNTER — Telehealth: Payer: Self-pay | Admitting: Neurology

## 2018-03-27 NOTE — Telephone Encounter (Signed)
Please don't reschedule with me - if this is reason to dismiss, please do-  thank you. CD

## 2018-03-27 NOTE — Telephone Encounter (Signed)
Pt cancelled appt w/o giving 24 hours notice on 8/13 due to conflict with schedule. Pt cancelled sleep consult again for 10/2 w/o giving 24 hour notice due to being out of town. Dr. Vickey Huger do you want to dismiss pt?

## 2018-03-28 ENCOUNTER — Institutional Professional Consult (permissible substitution): Payer: Medicare Other | Admitting: Neurology

## 2018-04-05 ENCOUNTER — Encounter: Payer: Self-pay | Admitting: Neurology

## 2018-04-13 ENCOUNTER — Ambulatory Visit (INDEPENDENT_AMBULATORY_CARE_PROVIDER_SITE_OTHER): Payer: Medicare Other | Admitting: Allergy & Immunology

## 2018-04-13 ENCOUNTER — Encounter: Payer: Self-pay | Admitting: Allergy & Immunology

## 2018-04-13 VITALS — BP 112/82 | HR 86 | Temp 97.8°F | Resp 18 | Ht 66.0 in | Wt 188.4 lb

## 2018-04-13 DIAGNOSIS — J3089 Other allergic rhinitis: Secondary | ICD-10-CM | POA: Diagnosis not present

## 2018-04-13 DIAGNOSIS — J4540 Moderate persistent asthma, uncomplicated: Secondary | ICD-10-CM

## 2018-04-13 DIAGNOSIS — J302 Other seasonal allergic rhinitis: Secondary | ICD-10-CM

## 2018-04-13 MED ORDER — MOMETASONE FUROATE 100 MCG/ACT IN AERO: 2.0000 | INHALATION_SPRAY | Freq: Two times a day (BID) | RESPIRATORY_TRACT | 5 refills | Status: DC | PRN

## 2018-04-13 MED ORDER — BUDESONIDE-FORMOTEROL FUMARATE 160-4.5 MCG/ACT IN AERO: 2.0000 | INHALATION_SPRAY | RESPIRATORY_TRACT | 5 refills | Status: DC

## 2018-04-13 MED ORDER — ALBUTEROL SULFATE (2.5 MG/3ML) 0.083% IN NEBU: INHALATION_SOLUTION | RESPIRATORY_TRACT | 2 refills | Status: DC

## 2018-04-13 NOTE — Progress Notes (Signed)
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 Moderate persistent asthma, uncomplicated - Lung testing looked low today and you were wheezing throughout your lungs, but it did improve with albuterol nebulizer. - Start the prednisone pack provided today: Take 3 tabs (30mg ) twice daily for 3 days, then 2 tabs (20mg ) twice daily for 3 days, then 1 tab (10mg ) twice daily for 3 days, then STOP - Add on Asmanex two puffs twice daily in addition to your Symbicort during respiratory flares (hopefully this will prevent future prednisone bursts).  - I would stick with the Symbicort two puffs twice daily for now. - Consider the addition of an injectable asthma medication such as Fasenra or Nucala. - These medications target eosinophils, which are white blood cells that do nothing useful aside from making asthma worse. - [name] will reach out to you on Monday to discuss more. - Spacer use reviewed. - Daily controller medication(s): Symbicort 160/4.80mcg two puffs twice daily with spacer - Prior to physical activity: ProAir 2 puffs 10-15 minutes before physical activity. - Rescue medications: ProAir 4 puffs every 4-6 hours as needed or albuterol nebulizer one vial every 4-6 hours as needed - Changes during respiratory infections or worsening symptoms: Add on Asmanex 2 puffs twice daily for ONE TO TWO WEEKS. - Asthma control goals:  * Full participation in all desired activities (may need albuterol before activity) * Albuterol use two time or less a week on average (not counting use with activity) * Cough interfering with sleep two time or less a month * Oral steroids  no more than once a year * No hospitalizations  2. Chronic rhinitis (weeds, indoor molds, outdoor molds, cat, dog, and cockroach) - Continue with fluticasone nasal spray 1-2 sprays per nostril daily. - Continue with cetirizine 10mg  daily.  3. Return in about 1 week (around 04/20/2018) at 11:00AM   Subjective:   [name] is a 57 y.o. female presenting today for follow up of  Chief Complaint  Patient presents with  . Follow-up  . Cough  . Wheezing    [name] has a history of the following: Patient Active Problem List   Diagnosis Date Noted  . Seasonal and perennial allergic rhinitis 04/15/2018  . 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 Chronic insomnia 11/29/2016  . Eosinophilia 03/29/2016  . Moderate persistent asthma, uncomplicated 03/29/2016  . 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 Other social stressor 03/29/2016  . 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 Arthritis of knee, degenerative 07/09/2015  . Chronic low back pain 04/15/2015  . Cough variant asthma 08/09/2014  . Pulmonary infiltrates with eosinophilia (HCC) 08/09/2014    History obtained from: chart review and patient.  Jeanne [facility] Primary Care Provider is Raliegh Ip, DO.     [name] is a 57 y.o. female presenting for a follow up visit.  She was last seen in October 2017.  At that time, we continued Symbicort 160/4.5 mcg 2 puffs twice daily.  We made her regimen a little more flexible but telling her that she could decrease to 2 puffs once daily if she was feeling well.  We continued with  Singulair 10 mg daily.  She does have an eosinophilic phenotype with an absolute eosinophil count of 312 as recently as June 2019.  Since the last visit, she has had some problems. She is seeing another PCP, who recommended that she comes to see her allergist again. She thought that she was doing fairly well without any issues. She reports that she has a "spell" every  now and again.   Asthma/Respiratory Symptom History: She remains on the Symbicort two puffs once daily, but she does increase up to twice daily during flares. She did increase to two puffs BID around 1-2 weeks ago when this current episode occurred. She has needed prednisone around twice in a 12 month period. She was on Spiriva in the past, but she does not think that it helped at all.   Allergic Rhinitis Symptom History: She takes two allergy pills and Flonase, but her nose continues to run. She will take some Mucinex which will clear her sinuses but then makes the cough and congestion spread to her lungs. Last environmental allergy testing was performed in June 2016 and was notable only to plantain (serum specific IgE testing). Interestingly, at that time, she did have eosinophilia of 965 in June 2016. She had skin testing performed in April 2013 that was positive to mold mix 2-4, cat, dog, and cockroach.   Otherwise, there have been no changes to her past medical history, surgical history, family history, or social history.    Review of Systems: a 14-point review of systems is pertinent for what is mentioned in HPI.  Otherwise, all other systems were negative.  Constitutional: negative other than that listed in the HPI Eyes: negative other than that listed in the HPI Ears, nose, mouth, throat, and face: negative other than that listed in the HPI Respiratory: negative other than that listed in the HPI Cardiovascular: negative other than that listed in the HPI Gastrointestinal: negative other than that listed in the HPI Genitourinary: negative other than that listed in the HPI Integument: negative other than that listed in the HPI Hematologic: negative other than that listed in the HPI Musculoskeletal: negative other than that listed in the HPI Neurological: negative other than that listed in the HPI Allergy/Immunologic: negative other than that listed in the HPI    Objective:   Blood  pressure 112/82, pulse 86, temperature 97.8 F (36.6 C), temperature source Oral, resp. rate 18, height 5\' 6"  (1.676 m), weight 188 lb 6.4 oz (85.5 kg), last menstrual period 08/30/2002, SpO2 91 %. Body mass index is 30.41 kg/m.   Physical Exam:  General: Alert, interactive, in mild acute distress. Eyes: No conjunctival injection bilaterally, no discharge on the right, no discharge on the left and no Horner-Trantas dots present. PERRL bilaterally. EOMI without pain. No photophobia.  Ears: Right TM pearly gray with normal light reflex, Left TM pearly gray with normal light reflex, Right TM intact without perforation and Left TM intact without perforation.  Nose/Throat: External nose within normal limits and septum midline. Turbinates edematous and pale with clear discharge. Posterior oropharynx erythematous without cobblestoning in the posterior oropharynx. Tonsils 2+ without exudates.  Tongue without thrush. Lungs: Decreased breath sounds with expiratory wheezing bilaterally. Increased work of breathing. CV: Normal S1/S2. No murmurs. Capillary refill <2 seconds.  Skin: Warm and dry, without lesions or rashes. Neuro:   Grossly intact. No focal deficits appreciated. Responsive to questions.  Diagnostic studies:   Spirometry: results abnormal (FEV1: 2.01/78%, FVC: 2.49/72%, FEV1/FVC: 80%).    Spirometry  consistent with possible restrictive disease. Xopenex/Atrovent nebulizer treatment given in clinic with significant improvement in FEV1 and FVC per ATS criteria.  Allergy Studies: none     Allergy testing results were read and interpreted by myself, documented by clinical staff.      Malachi Bonds, MD  [facility]

## 2018-04-13 NOTE — Patient Instructions (Addendum)
1. Moderate persistent asthma, uncomplicated - Lung testing looked low today and you were wheezing throughout your lungs, but it did improve with albuterol nebulizer. - Start the prednisone pack provided today: Take 3 tabs (30mg ) twice daily for 3 days, then 2 tabs (20mg ) twice daily for 3 days, then 1 tab (10mg ) twice daily for 3 days, then STOP - Add on Asmanex two puffs twice daily in addition to your Symbicort during respiratory flares (hopefully this will prevent future prednisone bursts).  - I would stick with the Symbicort two puffs twice daily for now. - Consider the addition of an injectable asthma medication such as Fasenra or Nucala. - These medications target eosinophils, which are white blood cells that do nothing useful aside from making asthma worse. - [name] will reach out to you on Monday to discuss more. - Spacer use reviewed. - Daily controller medication(s): Symbicort 160/4.3mcg two puffs twice daily with spacer - Prior to physical activity: ProAir 2 puffs 10-15 minutes before physical activity. - Rescue medications: ProAir 4 puffs every 4-6 hours as needed or albuterol nebulizer one vial every 4-6 hours as needed - Changes during respiratory infections or worsening symptoms: Add on Asmanex 2 puffs twice daily for ONE TO TWO WEEKS. - Asthma control goals:  * Full participation in all desired activities (may need albuterol before activity) * Albuterol use two time or less a week on average (not counting use with activity) * Cough interfering with sleep two time or less a month * Oral steroids no more than once a year * No hospitalizations  2. Chronic rhinitis (weeds, indoor molds, outdoor molds, cat, dog, and cockroach) - Continue with fluticasone nasal spray 1-2 sprays per nostril daily. - Continue with cetirizine 10mg  daily.  3. Return in about 1 week (around 04/20/2018) at 11:00AM   Please inform us of any Emergency Department visits, hospitalizations, or  changes in symptoms. Call us before going to the ED for breathing or allergy symptoms since we might be able to fit you in for a sick visit. Feel free to contact us anytime with any questions, problems, or concerns.  It was a pleasure to see you again today!  Websites that have reliable patient information: 1. American Academy of Asthma, Allergy, and Immunology: www.aaaai.org 2. Food Allergy Research and Education (FARE): foodallergy.org 3. Mothers of Asthmatics: http://www.asthmacommunitynetwork.org 4. American College of Allergy, Asthma, and Immunology: MissingWeapons.ca   Make sure you are registered to vote! If you have moved or changed any of your contact information, you will need to get this updated before voting!

## 2018-04-15 ENCOUNTER — Encounter: Payer: Self-pay | Admitting: Allergy & Immunology

## 2018-04-15 DIAGNOSIS — J302 Other seasonal allergic rhinitis: Secondary | ICD-10-CM | POA: Insufficient documentation

## 2018-04-15 DIAGNOSIS — J3089 Other allergic rhinitis: Secondary | ICD-10-CM

## 2018-04-16 ENCOUNTER — Ambulatory Visit (INDEPENDENT_AMBULATORY_CARE_PROVIDER_SITE_OTHER): Payer: Medicare Other | Admitting: Family Medicine

## 2018-04-16 ENCOUNTER — Encounter: Payer: Self-pay | Admitting: Family Medicine

## 2018-04-16 VITALS — BP 133/84 | HR 78 | Temp 97.9°F | Ht 67.0 in | Wt 189.0 lb

## 2018-04-16 DIAGNOSIS — M545 Low back pain, unspecified: Secondary | ICD-10-CM

## 2018-04-16 DIAGNOSIS — J45991 Cough variant asthma: Secondary | ICD-10-CM | POA: Diagnosis not present

## 2018-04-16 DIAGNOSIS — Z7689 Persons encountering health services in other specified circumstances: Secondary | ICD-10-CM | POA: Diagnosis not present

## 2018-04-16 DIAGNOSIS — G8929 Other chronic pain: Secondary | ICD-10-CM

## 2018-04-16 DIAGNOSIS — Z23 Encounter for immunization: Secondary | ICD-10-CM

## 2018-04-16 NOTE — Progress Notes (Signed)
Subjective: ZO:[id number] care, asthma, chronic back pain HPI: [name] is a 57 y.o. female presenting to clinic today for:  1.  Asthma Patient under the care of Dr. Dellis Anes with asthma and allergy.  Her last office visit was on the 18th of the month.  She was started on Asmanex in addition to her Symbicort.  She reports compliance with her inhalers and uses her rescue inhaler about twice per day.  She also reports compliance with Flonase, Xyzal and Singulair.  She has been on prednisone and is now on day 3.  She has not noticed a great deal of improvement in her symptoms but they certainly have not gotten any worse.  She reports wheezing and shortness of breath that is baseline.  They have discussed possibly doing allergy shots but patient is somewhat reluctant to do this because of the possibility that she may develop shingles.  2.  Chronic back pain Patient reports chronic back pain.  She is under the care of the spine specialist and sees them monthly.  She is prescribed MSIR 15 mg 3 times daily and Zanaflex 4 mg twice daily.  She takes these fairly consistently.  She points to the mid back as the source of her pain and states it occasionally will radiate down the right lower extremity.  Denies any falls.  Medical history significant for scoliosis, a 60 degree curve per patient's report, that was surgically corrected many years ago.  She had a spinal fusion.  Pain seems worse with prolonged sitting and certain activities.  She denies any lower extremity numbness or tingling.  She is fully functional and ADLs.  She has been on disability since prior to her surgery in 2010.  Past Medical History:  Diagnosis Date  . Arthritis    spine, R knee  . Asthma   . Back pain   . Blood transfusion without reported diagnosis    back surgery  . Cough 09/03/2014   Followed in Pulmonary clinic/ [facility]/ [place] - sinus CT 09/08/2014> Mild mucosal thickening involves the paranasal sinuses  - Eos  1.2  08/06/14 - Allergy profile 11/27/14 >  Eos 1.0/ IgE  41 with neg RAST    . GERD (gastroesophageal reflux disease)    Past Surgical History:  Procedure Laterality Date  . ABDOMINAL HYSTERECTOMY     "cysts", pelvic pain  . BACK SURGERY    . KNEE ARTHROSCOPY Right 2017  . SPINE SURGERY  2010   fusion  . SPINE SURGERY  2014   remove hardware   Social History   Socioeconomic History  . Marital status: Significant Other    Spouse name: Clide Cliff  . Number of children: 2  . Years of education: 109  . Highest education level: Not on file  Occupational History  . Occupation: disabled    Comment: back  Social Needs  . Financial resource strain: Not on file  . Food insecurity:    Worry: Not on file    Inability: Not on file  . Transportation needs:    Medical: Not on file    Non-medical: Not on file  Tobacco Use  . Smoking status: Never Smoker  . Smokeless tobacco: Never Used  Substance and Sexual Activity  . Alcohol use: No  . Drug use: No  . Sexual activity: Yes    Birth control/protection: Surgical  Lifestyle  . Physical activity:    Days per week: Not on file    Minutes per session: Not on  file  . Stress: Not on file  Relationships  . Social connections:    Talks on phone: Not on file    Gets together: Not on file    Attends religious service: Not on file    Active member of club or organization: Not on file    Attends meetings of clubs or organizations: Not on file    Relationship status: Not on file  . Intimate partner violence:    Fear of current or ex partner: Not on file    Emotionally abused: Not on file    Physically abused: Not on file    Forced sexual activity: Not on file  Other Topics Concern  . Not on file  Social History Narrative   Lives with Clide Cliff - 27 years   Clide Cliff has end stage colon cancer   Two sons/ grandchildren   Disabled from back pain/had scoliosis. Has had back reconstruction.    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    Current Meds  Medication Sig   . albuterol (PROVENTIL) (2.5 MG/3ML) 0.083% nebulizer solution Nebulize one ampule every 4 hours as needed for cough or wheeze.  Marland Kitchen albuterol (VENTOLIN HFA) 108 (90 Base) MCG/ACT inhaler INHALE TWO PUFFS INTO LUNGS EVERY 6 HOURS AS NEEDED FOR WHEEZING OR SHORTNESS OF BREATH  . budesonide-formoterol (SYMBICORT) 160-4.5 MCG/ACT inhaler Inhale 2 puffs into the lungs 2 (two) times a week.  . diclofenac (VOLTAREN) 75 MG EC tablet Take 75 mg by mouth 2 (two) times daily with a meal.  . fluticasone (FLONASE) 50 MCG/ACT nasal spray Place 2 sprays into both nostrils 2 (two) times daily.  Marland Kitchen ibuprofen (ADVIL,MOTRIN) 800 MG tablet Take 800 mg by mouth every 8 (eight) hours as needed. Pain  . levocetirizine (XYZAL) 5 MG tablet Take 1 tablet (5 mg total) by mouth every evening.  . Mometasone Furoate (ASMANEX HFA) 100 MCG/ACT AERO Inhale 2 puffs into the lungs 2 (two) times daily as needed.  . montelukast (SINGULAIR) 10 MG tablet Take 1 tablet (10 mg total) by mouth at bedtime.  Marland Kitchen morphine (MSIR) 15 MG tablet Take 15 mg by mouth every 8 (eight) hours as needed. Back Pain    . pantoprazole (PROTONIX) 40 MG tablet Take 1 tablet (40 mg total) by mouth daily.  Marland Kitchen tiZANidine (ZANAFLEX) 4 MG tablet Take 4 mg by mouth 2 (two) times daily as needed.  . traZODone (DESYREL) 50 MG tablet Take 0.5-1 tablets (25-50 mg total) by mouth at bedtime as needed. for sleep   Family History  Problem Relation Age of Onset  . Emphysema Maternal Grandmother        never smoker  . COPD Maternal Grandmother   . Emphysema Maternal Uncle        smoked  . COPD Maternal Uncle   . Arthritis Mother   . Diabetes Mother   . Hyperlipidemia Mother   . Hypertension Mother   . Early death Father 67       MVA  . Asthma Sister   . Asthma Brother   . Heart disease Maternal Grandfather    No Known Allergies   Health Maintenance: Influenza ROS: Per HPI  Objective: Office vital signs reviewed. BP 133/84   Pulse 78   Temp 97.9 F  (36.6 C) (Oral)   Ht 5\' 7"  (1.702 m)   Wt 189 lb (85.7 kg)   LMP 08/30/2002 (Approximate)   BMI 29.60 kg/m   Physical Examination:  General: Awake, alert, well nourished, well appearing. No acute distress HEENT:  sclera white, MMM Cardio: regular rate and rhythm, S1S2 heard, no murmurs appreciated Pulm: Global inspiratory and expiratory wheezes.good air movement.  No rhonchi or rales; normal work of breathing on room air Spine: Flattening of thoracolumbar curve noted.  Patient is independently ambulatory.  Assessment/ Plan: 57 y.o. female   1. Cough variant asthma Currently under the care of asthma and allergy.  Continue current regimen.  We did discuss that she should consider allergy shots.  I went over the fact that many things may increase her risk for shingles, including stress and use of prednisone.  She seems to be more on board with allergy shots after finding out this information.  She has follow-up with her asthma and allergist specialist at the end of the week.  2. Chronic midline low back pain without sciatica Under the care of [facility] in [place].  She is tolerating medication without difficulty.  3. Establishing care with new doctor, encounter for Information was visible in care everywhere.  We discussed that during her physical in June, we should consider doing a pelvic exam to evaluate for cervical cuff since her hysterectomy was a partial hysterectomy.  Currently, denying any abnormal vaginal symptoms. UTD on colon cancer screening, mammography.  Flu vaccine administered today.  Raliegh Ip, DO [facility] [phone]

## 2018-04-20 ENCOUNTER — Encounter: Payer: Self-pay | Admitting: Allergy & Immunology

## 2018-04-20 ENCOUNTER — Telehealth: Payer: Self-pay

## 2018-04-20 ENCOUNTER — Ambulatory Visit (INDEPENDENT_AMBULATORY_CARE_PROVIDER_SITE_OTHER): Payer: Medicare Other | Admitting: Allergy & Immunology

## 2018-04-20 VITALS — BP 132/74

## 2018-04-20 DIAGNOSIS — J3089 Other allergic rhinitis: Secondary | ICD-10-CM

## 2018-04-20 DIAGNOSIS — J302 Other seasonal allergic rhinitis: Secondary | ICD-10-CM

## 2018-04-20 DIAGNOSIS — J209 Acute bronchitis, unspecified: Secondary | ICD-10-CM | POA: Diagnosis not present

## 2018-04-20 DIAGNOSIS — J4540 Moderate persistent asthma, uncomplicated: Secondary | ICD-10-CM

## 2018-04-20 MED ORDER — AZITHROMYCIN 250 MG PO TABS: ORAL_TABLET | ORAL | 0 refills | Status: DC

## 2018-04-20 NOTE — Addendum Note (Signed)
Addended by: Florence Canner on: 04/20/2018 02:03 PM   Modules accepted: Orders

## 2018-04-20 NOTE — Progress Notes (Signed)
FOLLOW UP  Date of Service/Encounter:  04/20/18   Assessment:   Moderate persistent asthma, uncomplicated  Seasonal and perennial allergic rhinitis  Acute bronchitis    [name] is doing markedly better compared to where she was one week ago. However, she does continue to have some basilar expiratory wheezes and she reports continued productive cough with green and yellow sputum. Therefore we will add on coverage with azithromycin to provide both another method of inflammatory control and cover any respiratory bacteria that might have started to flourish. I did encourage her to continue to consider the addition of Nucala for her management, and she will continue to think about this. We will continue with Asmanex for one more week and she will finish out her prednisone. We will follow up in three months to see how she is doing.    Plan/Recommendations:   1. Moderate persistent asthma, uncomplicated - Lung testing looked better today but you were still squeaky at the bases.  - Add azithromycin two tablets today and one tablet daily for four more days.  - Continue with the added on Asmanex for one more week and then stop.  - Call us the middle of next week with an update. - Consider the addition of Nucala and call us when you make a decision. - Daily controller medication(s): Symbicort 160/4.16mcg two puffs twice daily with spacer - Prior to physical activity: ProAir 2 puffs 10-15 minutes before physical activity. - Rescue medications: ProAir 4 puffs every 4-6 hours as needed or albuterol nebulizer one vial every 4-6 hours as needed - Changes during respiratory infections or worsening symptoms: Add on Asmanex 2 puffs twice daily for ONE TO TWO WEEKS. - Asthma control goals:  * Full participation in all desired activities (may need albuterol before activity) * Albuterol use two time or less a week on average (not counting use with activity) * Cough interfering with sleep two  time or less a month * Oral steroids no more than once a year * No hospitalizations  2. Chronic rhinitis (weeds, indoor molds, outdoor molds, cat, dog, and cockroach) - Continue with fluticasone nasal spray 1-2 sprays per nostril daily. - Continue with cetirizine 10mg  daily.   3. Return in about 3 months (around 07/21/2018).  Subjective:   [name] is a 57 y.o. female presenting today for follow up of  Chief Complaint  Patient presents with  . Follow-up    1 WK VISIT    [name] has a history of the following: Patient Active Problem List   Diagnosis Date Noted  . Seasonal and perennial allergic rhinitis 04/15/2018  . 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 Chronic insomnia 11/29/2016  . Eosinophilia 03/29/2016  . Moderate persistent asthma, uncomplicated 03/29/2016  . 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 Other social stressor 03/29/2016  . 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 Arthritis of knee, degenerative 07/09/2015  . Chronic low back pain 04/15/2015  . Cough variant asthma 08/09/2014  . Pulmonary infiltrates with eosinophilia (HCC) 08/09/2014    History obtained from: chart review and the patient.  [name] [facility] Primary Care Provider is Raliegh Ip, DO.     [name] is a 57 y.o. female presenting for a follow up visit. She was last seen one week ago after a two year hiatus. At that time, her lung function looked fairly terrible and we gave her two treatments with an improvement in her lung function,  although it did not normalize. We also gave her a 9 day prednisone taper and added on Asmanex to her regimen. We did discuss the addition of an anti-IL5 medication. For her P/SAR, we continued with fluticasone nasal spray as well as cetirizine.  Since the last visit, she has done fairly well. She does remain on her nebulizer 1-2 times daily or more. She is on the Symbicort and the Asmanex. She has been compliant with  her prednisone. However, she tells me today that she continues to have chest congestion with purulent productive cough. She has not been febrile. Overall she does feel better, but she has reached a "plateau". She continues to consider the Nucala as an adjuvant to her asthma treatment.  Otherwise, there have been no changes to her past medical history, surgical history, family history, or social history.    Review of Systems: a 14-point review of systems is pertinent for what is mentioned in HPI.  Otherwise, all other systems were negative.  Constitutional: negative other than that listed in the HPI Eyes: negative other than that listed in the HPI Ears, nose, mouth, throat, and face: negative other than that listed in the HPI Respiratory: negative other than that listed in the HPI Cardiovascular: negative other than that listed in the HPI Gastrointestinal: negative other than that listed in the HPI Genitourinary: negative other than that listed in the HPI Integument: negative other than that listed in the HPI Hematologic: negative other than that listed in the HPI Musculoskeletal: negative other than that listed in the HPI Neurological: negative other than that listed in the HPI Allergy/Immunologic: negative other than that listed in the HPI    Objective:   Blood pressure 132/74, last menstrual period 08/30/2002. There is no height or weight on file to calculate BMI.   Physical Exam:  General: Alert, interactive, in no acute distress. Talkative.  Eyes: No conjunctival injection bilaterally, no discharge on the right, no discharge on the left and no Horner-Trantas dots present. PERRL bilaterally. EOMI without pain. No photophobia.  Ears: Right TM pearly gray with normal light reflex, Left TM pearly gray with normal light reflex, Right TM intact without perforation and Left TM intact without perforation.  Nose/Throat: External nose within normal limits and septum midline. Turbinates  edematous without discharge. Posterior oropharynx mildly erythematous without cobblestoning in the posterior oropharynx. Tonsils 2+ without exudates.  Tongue without thrush. Lungs: CTAB but she does have coarse upper airway sounds throughout with faint expiratory wheezing in the bases. No increased work of breathing. CV: Normal S1/S2. No murmurs. Capillary refill <2 seconds.  Skin: Warm and dry, without lesions or rashes. Neuro:   Grossly intact. No focal deficits appreciated. Responsive to questions.  Diagnostic studies:   Spirometry: results normal (FEV1: 2.43/94%, FVC: 3.03/88%, FEV1/FVC: 80%).    Spirometry consistent with normal pattern.   Allergy Studies: none     Allergy testing results were read and interpreted by myself, documented by clinical staff.      Malachi Bonds, MD  [facility]

## 2018-04-20 NOTE — Telephone Encounter (Signed)
Received an approval letter from [employer] for Asmanex [facility] Aerosol.  Coverage from 01/20/2018-04/20/2019  Member ID# [id number]

## 2018-04-20 NOTE — Patient Instructions (Addendum)
1. Moderate persistent asthma, uncomplicated - Lung testing looked better today but you were still squeaky at the bases.  - Add azithromycin two tablets today and one tablet daily for four more days.  - Continue with the added on Asmanex for one more week and then stop.  - Call us the middle of next week with an update. - Consider the addition of Nucala and call us when you make a decision. - Daily controller medication(s): Symbicort 160/4.28mcg two puffs twice daily with spacer - Prior to physical activity: ProAir 2 puffs 10-15 minutes before physical activity. - Rescue medications: ProAir 4 puffs every 4-6 hours as needed or albuterol nebulizer one vial every 4-6 hours as needed - Changes during respiratory infections or worsening symptoms: Add on Asmanex 2 puffs twice daily for ONE TO TWO WEEKS. - Asthma control goals:  * Full participation in all desired activities (may need albuterol before activity) * Albuterol use two time or less a week on average (not counting use with activity) * Cough interfering with sleep two time or less a month * Oral steroids no more than once a year * No hospitalizations  2. Chronic rhinitis (weeds, indoor molds, outdoor molds, cat, dog, and cockroach) - Continue with fluticasone nasal spray 1-2 sprays per nostril daily. - Continue with cetirizine 10mg  daily.   3. Return in about 3 months (around 07/21/2018).   Please inform us of any Emergency Department visits, hospitalizations, or changes in symptoms. Call us before going to the ED for breathing or allergy symptoms since we might be able to fit you in for a sick visit. Feel free to contact us anytime with any questions, problems, or concerns.  It was a pleasure to see you again today!  Websites that have reliable patient information: 1. American Academy of Asthma, Allergy, and Immunology: www.aaaai.org 2. Food Allergy Research and Education (FARE): foodallergy.org 3. Mothers of Asthmatics:  http://www.asthmacommunitynetwork.org 4. American College of Allergy, Asthma, and Immunology: MissingWeapons.ca   Make sure you are registered to vote! If you have moved or changed any of your contact information, you will need to get this updated before voting!

## 2018-04-20 NOTE — Telephone Encounter (Signed)
Prior authorization requested for Asmanex inhaler. This has been approved, faxed to pharmacy and copy sent to be scanned in to the patient's chart.

## 2018-06-14 ENCOUNTER — Telehealth: Payer: Self-pay | Admitting: *Deleted

## 2018-06-14 NOTE — Telephone Encounter (Signed)
Had left message for patient [name] 2 with no response in November. I did reach back out to patient and she advised she would probably do something but was waiting till after December.  I advised her she can reach back out to me or Dr Dellis AnesGallagher and we can discuss then

## 2018-06-15 NOTE — Telephone Encounter (Signed)
That sounds great!   Michelle BondsJoel Onedia Vargus, MD [facility]

## 2018-06-25 ENCOUNTER — Other Ambulatory Visit: Payer: Self-pay | Admitting: Allergy & Immunology

## 2018-07-04 ENCOUNTER — Ambulatory Visit: Payer: Medicare Other | Admitting: Allergy & Immunology

## 2018-07-11 ENCOUNTER — Other Ambulatory Visit: Payer: Self-pay | Admitting: Allergy & Immunology

## 2018-07-18 ENCOUNTER — Encounter: Payer: Self-pay | Admitting: Allergy & Immunology

## 2018-07-18 ENCOUNTER — Ambulatory Visit (INDEPENDENT_AMBULATORY_CARE_PROVIDER_SITE_OTHER): Payer: Medicare Other | Admitting: Allergy & Immunology

## 2018-07-18 ENCOUNTER — Telehealth: Payer: Self-pay | Admitting: Allergy & Immunology

## 2018-07-18 VITALS — BP 124/78 | HR 57 | Temp 98.6°F | Resp 20 | Ht 67.4 in | Wt 192.2 lb

## 2018-07-18 DIAGNOSIS — J4540 Moderate persistent asthma, uncomplicated: Secondary | ICD-10-CM

## 2018-07-18 DIAGNOSIS — J3089 Other allergic rhinitis: Secondary | ICD-10-CM

## 2018-07-18 DIAGNOSIS — J302 Other seasonal allergic rhinitis: Secondary | ICD-10-CM | POA: Diagnosis not present

## 2018-07-18 NOTE — Telephone Encounter (Signed)
[name], Patient said she spoke with you today and did not have her insurance card. She was able to get the ID numbers and would like to know if that is good enough for you. She would like you to give her a call. Either today or tomorrow.

## 2018-07-18 NOTE — Patient Instructions (Addendum)
1. Moderate persistent asthma, uncomplicated - Lung testing looked stable today.   - Try using the pantoprazole on a daily basis to help with the cough. - We are going to officially submit for approval for Harrington Challenger since this one is spaced to every two weeks eventually.  - Daily controller medication(s): Symbicort 160/4.39mcg two puffs twice daily with spacer - Prior to physical activity: ProAir 2 puffs 10-15 minutes before physical activity. - Rescue medications: ProAir 4 puffs every 4-6 hours as needed or albuterol nebulizer one vial every 4-6 hours as needed - Changes during respiratory infections or worsening symptoms: Add on Asmanex 2 puffs twice daily for ONE TO TWO WEEKS. - Asthma control goals:  * Full participation in all desired activities (may need albuterol before activity) * Albuterol use two time or less a week on average (not counting use with activity) * Cough interfering with sleep two time or less a month * Oral steroids no more than once a year * No hospitalizations  2. Chronic rhinitis (weeds, indoor molds, outdoor molds, cat, dog, and cockroach) - Continue with fluticasone nasal spray 1-2 sprays per nostril daily. - Continue with cetirizine 10mg  daily.   3. Return in about 2 days for Fasenra injection.   Please inform us of any Emergency Department visits, hospitalizations, or changes in symptoms. Call us before going to the ED for breathing or allergy symptoms since we might be able to fit you in for a sick visit. Feel free to contact us anytime with any questions, problems, or concerns.  It was a pleasure to see you again today!  Websites that have reliable patient information: 1. American Academy of Asthma, Allergy, and Immunology: www.aaaai.org 2. Food Allergy Research and Education (FARE): foodallergy.org 3. Mothers of Asthmatics: http://www.asthmacommunitynetwork.org 4. American College of Allergy, Asthma, and Immunology: MissingWeapons.ca   Make sure you  are registered to vote! If you have moved or changed any of your contact information, you will need to get this updated before voting!

## 2018-07-18 NOTE — Progress Notes (Signed)
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 presents for follow-up visit.  She has been compliant with her Symbicort and is even taking the Asmanex in addition to the Symbicort.  However, she continues to have some shortness of breath and coughing which she cannot clear.  She does have a history of GERD but is not taking her Protonix on a daily basis, therefore this could be a trigger for symptoms.  However, on exam, she does have left-sided expiratory wheezing present.  Because of this as well as her persistent eosinophilic phenotype, I did emphasize the need to start a biologic to see if this can help control her symptoms better.  Her last eosinophil count was 312 in June 2019, but she has had levels upwards of 600 to 1000 in the last 3 years.  [place] so Harrington ChallengerFasenra would be a more ideal option for her.  I did talk to [name], and we obtain copies of her part D card of her Medicare.  She will have to apply for free drug through [place] and [place].   Plan/Recommendations:   1. Moderate persistent asthma, uncomplicated - Lung testing looked stable today.   - Try using the pantoprazole on a daily basis to help with the cough. - We are going to officially submit for approval for Harrington ChallengerFasenra since this one is spaced to every two weeks eventually.  - Daily controller medication(s): Symbicort 160/4.285mcg two puffs twice daily with spacer - Prior to physical activity: ProAir 2 puffs 10-15 minutes before physical activity. - Rescue medications: ProAir 4 puffs every 4-6 hours as needed or albuterol nebulizer one vial every 4-6 hours as needed - Changes during respiratory infections or worsening symptoms: Add on Asmanex 100mcg 2 puffs twice daily for ONE TO TWO WEEKS. - Asthma control goals:  * Full  participation in all desired activities (may need albuterol before activity) * Albuterol use two time or less a week on average (not counting use with activity) * Cough interfering with sleep two time or less a month * Oral steroids no more than once a year * No hospitalizations  2. Chronic rhinitis (weeds, indoor molds, outdoor molds, cat, dog, and cockroach) - Continue with fluticasone nasal spray 1-2 sprays per nostril daily. - Continue with cetirizine 10mg  daily.   3. Return in about 2 weeks for Fasenra injection.  Subjective:   Michelle FreshwaterCathy W Gopaul is a 58 y.o. female presenting today for follow up of  Chief Complaint  Patient presents with  . Follow-up    Refill Flonase    Michelle FreshwaterCathy W Bard has a history of the following: Patient Active Problem List   Diagnosis Date Noted  . Seasonal and perennial allergic rhinitis 04/15/2018  . 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 Chronic insomnia 11/29/2016  . Eosinophilia 03/29/2016  . Moderate persistent asthma, uncomplicated 03/29/2016  . 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 Other social stressor 03/29/2016  . 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 Arthritis of knee, degenerative 07/09/2015  . Chronic low back pain 04/15/2015  . Cough variant asthma 08/09/2014  . Pulmonary infiltrates with eosinophilia (HCC) 08/09/2014    History obtained from: chart review and patient.  [name] Primary Care Provider is Raliegh IpGottschalk, Ashly M, DO.  [name] is a 58 y.o. female presenting for a follow up visit.  She was last seen in October 2019.  At that time, we had recently seen her due to an asthma exacerbation.  She continued to have green-yellow sputum, so we added on azithromycin.  During the visit 1 week prior, we had put her on a 9-day prednisone taper since she sounded so bad.  We did discuss the use of mepolizumab for control of her asthma.  For her maintenance, we continued Symbicort 160/4.5 mcg 2  puffs twice daily.  She does have a history of chronic perennial and seasonal allergic rhinitis.  We continued with fluticasone 1 to 2 sprays per nostril daily and cetirizine 10 mg daily.  Since the last visit, she has done well. She does continue to have a cough. This cough occurs during the day and night and is typically productive of a clear sputum. Occasionally it is somewhat frothy. She does hear wheezing at night and it even wakes up her husband.   She has a history of GERD and has pantoprazole. She does not take pantoprazole daily. She has never taken it on a daily basis and only uses it when she feels symptoms of heart burn.   Her eosinophil count has been elevated for the past 3 years at least. She is on morphine which was started in 2010 due to back surgery. This is the only new medication in the last 5 years or so.    Otherwise, there have been no changes to her past medical history, surgical history, family history, or social history.    Review of Systems: a 14-point review of systems is pertinent for what is mentioned in HPI.  Otherwise, all other systems were negative.  Constitutional: negative other than that listed in the HPI Eyes: negative other than that listed in the HPI Ears, nose, mouth, throat, and face: negative other than that listed in the HPI Respiratory: negative other than that listed in the HPI Cardiovascular: negative other than that listed in the HPI Gastrointestinal: negative other than that listed in the HPI Genitourinary: negative other than that listed in the HPI Integument: negative other than that listed in the HPI Hematologic: negative other than that listed in the HPI Musculoskeletal: negative other than that listed in the HPI Neurological: negative other than that listed in the HPI Allergy/Immunologic: negative other than that listed in the HPI    Objective:   Blood pressure 124/78, pulse (!) 57, temperature 98.6 F (37 C), temperature source  Oral, resp. rate 20, height 5' 7.4" (1.712 m), weight 192 lb 3.2 oz (87.2 kg), last menstrual period 08/30/2002, SpO2 96 %. Body mass index is 29.74 kg/m.   Physical Exam:  General: Alert, interactive, in no acute distress. Pleasant and smiling.  Eyes: No conjunctival injection bilaterally, no discharge on the right, no discharge on the left and no Horner-Trantas dots present. PERRL bilaterally. EOMI without pain. No photophobia.  Ears: Right TM pearly gray with normal light reflex, Left TM pearly gray with normal light reflex, Right TM intact without perforation and Left TM intact without perforation.  Nose/Throat: External nose within normal limits and septum midline. Turbinates markedly edematous and pale without discharge. Posterior oropharynx mildly erythematous without cobblestoning in the posterior oropharynx. Tonsils 2+ without exudates.  Tongue without thrush. Lungs: Decreased breath sounds with expiratory wheezing bilaterally. No increased work of breathing. CV: Normal S1/S2. No murmurs. Capillary refill <2 seconds.  Skin: Warm and dry, without lesions or rashes.  Neuro:   Grossly intact. No focal deficits appreciated. Responsive to questions.  Diagnostic studies:  Spirometry: results normal (FEV1: 1.84/69%, FVC: 2.65/75%, FEV1/FVC: 69%).    Spirometry consistent with possible restrictive disease.   Allergy Studies: none         Malachi BondsJoel Tanishi Nault, MD  [facility]

## 2018-07-19 NOTE — Telephone Encounter (Signed)
Called patient and advised need copy of card

## 2018-08-01 ENCOUNTER — Ambulatory Visit: Payer: Medicare Other

## 2018-09-14 ENCOUNTER — Ambulatory Visit (INDEPENDENT_AMBULATORY_CARE_PROVIDER_SITE_OTHER): Payer: Medicare Other | Admitting: Allergy & Immunology

## 2018-09-14 ENCOUNTER — Other Ambulatory Visit: Payer: Self-pay

## 2018-09-14 ENCOUNTER — Encounter: Payer: Self-pay | Admitting: Allergy & Immunology

## 2018-09-14 VITALS — BP 134/82 | HR 71 | Resp 18

## 2018-09-14 DIAGNOSIS — J3089 Other allergic rhinitis: Secondary | ICD-10-CM | POA: Diagnosis not present

## 2018-09-14 DIAGNOSIS — J302 Other seasonal allergic rhinitis: Secondary | ICD-10-CM

## 2018-09-14 DIAGNOSIS — J4541 Moderate persistent asthma with (acute) exacerbation: Secondary | ICD-10-CM | POA: Diagnosis not present

## 2018-09-14 MED ORDER — METHYLPREDNISOLONE ACETATE 40 MG/ML IJ SUSP
40.0000 mg | Freq: Once | INTRAMUSCULAR | Status: AC
  Administered 2018-09-14: 40 mg via INTRAMUSCULAR

## 2018-09-14 NOTE — Patient Instructions (Addendum)
1. Moderate persistent asthma, uncomplicated - Lung testing looked terrible today, but it did improve with the nebulizer treatment. - DepoMedrol 40mg  given in clinic today. - Start the prednisone burst as well.  - Paperwork for [employer] assistance filled out. - We need social security income verification forms from both you and your spouse (if applicable) to get the assistance approved. - Please bring these by the office and we can make copies.  - Daily controller medication(s): Symbicort 160/4.61mcg two puffs twice daily with spacer - Prior to physical activity: ProAir 2 puffs 10-15 minutes before physical activity. - Rescue medications: ProAir 4 puffs every 4-6 hours as needed or albuterol nebulizer one vial every 4-6 hours as needed - Changes during respiratory infections or worsening symptoms: Add on Asmanex 2 puffs twice daily for ONE TO TWO WEEKS. - Asthma control goals:  * Full participation in all desired activities (may need albuterol before activity) * Albuterol use two time or less a week on average (not counting use with activity) * Cough interfering with sleep two time or less a month * Oral steroids no more than once a year * No hospitalizations  2. Chronic rhinitis (weeds, indoor molds, outdoor molds, cat, dog, and cockroach) - Continue with fluticasone nasal spray 1-2 sprays per nostril daily. - Continue with cetirizine 10mg  daily.  3. Return in about 3 months (around 12/15/2018).   Please inform us of any Emergency Department visits, hospitalizations, or changes in symptoms. Call us before going to the ED for breathing or allergy symptoms since we might be able to fit you in for a sick visit. Feel free to contact us anytime with any questions, problems, or concerns.  It was a pleasure to see you again today!  Websites that have reliable patient information: 1. American Academy of Asthma, Allergy, and Immunology: www.aaaai.org 2. Food Allergy Research and Education  (FARE): foodallergy.org 3. Mothers of Asthmatics: http://www.asthmacommunitynetwork.org 4. American College of Allergy, Asthma, and Immunology: www.acaai.org  "Like" [place] on Facebook and Instagram for our latest updates!      Make sure you are registered to vote! If you have moved or changed any of your contact information, you will need to get this updated before voting!    Voter ID laws are NOT going into effect for the General Election in November 2020! DO NOT let this stop you from exercising your right to vote!

## 2018-09-14 NOTE — Progress Notes (Signed)
FOLLOW UP  Date of Service/Encounter:  09/14/18   Assessment:   Moderate persistent asthma with acute exacerbation - with eosinophilic phenotype (AEC 312 in June 2019)  Seasonal and perennial allergic rhinitis(indoor and outdoor molds, cat, dog, cockroach)  Plan/Recommendations:   1. Moderate persistent asthma with acute exacerbation - Lung testing looked terrible today, but it did improve with the nebulizer treatment. - DepoMedrol  given in clinic today. - Start the prednisone burst as well.  - Paperwork for [employer] assistance filled out. - We need social security income verification forms from both you and your spouse (if applicable) to get the assistance approved. - Please bring these by the office and we can make copies.  - Daily controller medication(s): Symbicort 160/4.44mcg two puffs twice daily with spacer - Prior to physical activity: ProAir 2 puffs 10-15 minutes before physical activity. - Rescue medications: ProAir 4 puffs every 4-6 hours as needed or albuterol nebulizer one vial every 4-6 hours as needed - Changes during respiratory infections or worsening symptoms: Add on Asmanex 2 puffs twice daily for ONE TO TWO WEEKS. - Asthma control goals:  * Full participation in all desired activities (may need albuterol before activity) * Albuterol use two time or less a week on average (not counting use with activity) * Cough interfering with sleep two time or less a month * Oral steroids no more than once a year * No hospitalizations  2. Chronic rhinitis (weeds, indoor molds, outdoor molds, cat, dog, and cockroach) - Continue with fluticasone nasal spray 1-2 sprays per nostril daily. - Continue with cetirizine  daily.  3. Return in about 3 months (around 12/15/2018).  Subjective:   [name] is a 58 y.o. 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  [name] is a 58 y.o. female presenting for a sick visit.  She was last seen in January 2020.  At that time, her lung testing looks stable.  She was continued on Symbicort.  She also has Asmanex to add during respiratory flares.  We did discuss starting Fasenra at the last visit.  I believe this process is already been started.  Asthma/Respiratory Symptom History: She has been using her Symbicort 2 puffs twice daily.  She does use her spacer every time.  She reports that for the last 2 weeks she has had increasing wheezing, which she feels is more left-sided.  She has not had any chest pain or fever during this time.  She has had no upper respiratory infection symptoms.  She has been using her rescue inhaler with some improvement in symptoms.  This is typically a bad time of the year for  her.  She does not remember receiving any information regarding a form to fill out to start her Harrington Challenger in the mail, but she remains interested in the injections.  I did talk to [name] this morning and she needs her to fill out the form that she sent in January.  She is going to fax [place] a copy so that she can fill it out while she is here.  She also needs Social Security benefit  confirmation.  Allergic Rhinitis Symptom History: She remains on her Flonase and cetirizine.  There have been no new exposures to her knowledge, but she can tell that her symptoms are worsening with exposure to the pollens.  Otherwise, there have been no changes to her past medical history, surgical history, family history, or social history.    Review of Systems  Constitutional: Negative.  Negative for fever, malaise/fatigue and weight loss.  HENT: Negative.  Negative for congestion, ear discharge and ear pain.   Eyes: Negative for pain, discharge and redness.  Respiratory: Negative for cough, sputum production, shortness of breath and wheezing.   Cardiovascular: Negative.  Negative for chest pain and palpitations.  Gastrointestinal: Negative for abdominal pain and heartburn.  Skin: Negative.  Negative for itching and rash.  Neurological: Negative for dizziness and headaches.  Endo/Heme/Allergies: Negative for environmental allergies. Does not bruise/bleed easily.       Objective:   Blood pressure 134/82, pulse 71, resp. rate 18, last menstrual period 08/30/2002, SpO2 94 %. There is no height or weight on file to calculate BMI.   Physical Exam:  Physical Exam  Constitutional: She appears well-developed.  Smiling and pleasant.  HENT:  Head: Normocephalic and atraumatic.  Right Ear: Tympanic membrane, external ear and ear canal normal.  Left Ear: Tympanic membrane, external ear and ear canal normal.  Nose: Mucosal edema present. No rhinorrhea, nasal deformity or septal deviation. No epistaxis. Right sinus exhibits no maxillary sinus tenderness and no frontal sinus tenderness. Left sinus exhibits no maxillary sinus tenderness and no frontal sinus tenderness.  Mouth/Throat: Uvula is midline and oropharynx is clear and moist. Mucous membranes are not pale and not dry.  No cobblestoning in the posterior oropharynx.  Eyes: Pupils are equal, round, and reactive to light. Conjunctivae  and EOM are normal. Right eye exhibits no chemosis and no discharge. Left eye exhibits no chemosis and no discharge. Right conjunctiva is not injected. Left conjunctiva is not injected.  Cardiovascular: Normal rate, regular rhythm and normal heart sounds.  Respiratory: Effort normal. No accessory muscle usage. No tachypnea. No respiratory distress. She has wheezes. She has no rhonchi. She has no rales. She exhibits no tenderness.  Expiratory wheezing throughout.  This does seem more prominent on the patient's left side, but she certainly has it scattered over all lung fields.  There is no increased work of breathing.  There are no crackles suggestive of pneumonia.  She does not appear to be in distress.  Lymphadenopathy:    She has no cervical adenopathy.  Neurological: She is alert.  Skin: No abrasion, no petechiae and no rash noted. Rash is not papular, not vesicular and not urticarial. No erythema. No pallor.  Psychiatric: She has a normal mood and affect.     Diagnostic studies:    Spirometry: results abnormal (FEV1: 1.59/60%, FVC: 2.01/57%, FEV1/FVC: 78%).    Spirometry consistent with possible restrictive disease. Xopenex/Atrovent nebulizer treatment given in clinic with significant improvement in FEV1 and FVC per ATS criteria.  Her FEV1 increased 34% and  her FVC increased 26%.  Allergy Studies: none       Malachi Bonds, MD  [facility]

## 2018-09-17 ENCOUNTER — Other Ambulatory Visit: Payer: Self-pay | Admitting: Family Medicine

## 2018-09-17 DIAGNOSIS — J3089 Other allergic rhinitis: Secondary | ICD-10-CM

## 2018-09-17 MED ORDER — LEVOCETIRIZINE DIHYDROCHLORIDE 5 MG PO TABS: 5.0000 mg | ORAL_TABLET | Freq: Every evening | ORAL | 1 refills | Status: DC

## 2018-09-17 NOTE — Telephone Encounter (Signed)
Pt aware refill sent to pharmacy 

## 2018-11-02 ENCOUNTER — Telehealth: Payer: Self-pay | Admitting: Family Medicine

## 2018-11-15 ENCOUNTER — Other Ambulatory Visit: Payer: Self-pay | Admitting: Allergy & Immunology

## 2018-12-05 ENCOUNTER — Other Ambulatory Visit: Payer: Self-pay | Admitting: Allergy & Immunology

## 2018-12-13 ENCOUNTER — Other Ambulatory Visit (INDEPENDENT_AMBULATORY_CARE_PROVIDER_SITE_OTHER): Payer: Self-pay | Admitting: Internal Medicine

## 2018-12-14 ENCOUNTER — Ambulatory Visit: Payer: Medicare Other | Admitting: Allergy & Immunology

## 2018-12-19 ENCOUNTER — Ambulatory Visit: Payer: Medicare Other | Admitting: Allergy & Immunology

## 2018-12-20 ENCOUNTER — Ambulatory Visit (INDEPENDENT_AMBULATORY_CARE_PROVIDER_SITE_OTHER): Payer: Medicare Other | Admitting: Allergy & Immunology

## 2018-12-20 ENCOUNTER — Encounter: Payer: Self-pay | Admitting: Allergy & Immunology

## 2018-12-20 ENCOUNTER — Other Ambulatory Visit: Payer: Self-pay

## 2018-12-20 DIAGNOSIS — J302 Other seasonal allergic rhinitis: Secondary | ICD-10-CM

## 2018-12-20 DIAGNOSIS — K219 Gastro-esophageal reflux disease without esophagitis: Secondary | ICD-10-CM | POA: Diagnosis not present

## 2018-12-20 DIAGNOSIS — J4540 Moderate persistent asthma, uncomplicated: Secondary | ICD-10-CM | POA: Diagnosis not present

## 2018-12-20 DIAGNOSIS — J3089 Other allergic rhinitis: Secondary | ICD-10-CM

## 2018-12-20 MED ORDER — LEVOCETIRIZINE DIHYDROCHLORIDE 5 MG PO TABS: 5.0000 mg | ORAL_TABLET | Freq: Every evening | ORAL | 1 refills | Status: DC

## 2018-12-20 MED ORDER — PANTOPRAZOLE SODIUM 40 MG PO TBEC: 40.0000 mg | DELAYED_RELEASE_TABLET | Freq: Every day | ORAL | 1 refills | Status: DC

## 2018-12-20 MED ORDER — ASMANEX HFA 100 MCG/ACT IN AERO: 2.0000 | INHALATION_SPRAY | Freq: Two times a day (BID) | RESPIRATORY_TRACT | 5 refills | Status: DC | PRN

## 2018-12-20 MED ORDER — MONTELUKAST SODIUM 10 MG PO TABS: 10.0000 mg | ORAL_TABLET | Freq: Every day | ORAL | 5 refills | Status: DC

## 2018-12-20 MED ORDER — ALBUTEROL SULFATE HFA 108 (90 BASE) MCG/ACT IN AERS: INHALATION_SPRAY | RESPIRATORY_TRACT | 6 refills | Status: DC

## 2018-12-20 MED ORDER — BUDESONIDE-FORMOTEROL FUMARATE 160-4.5 MCG/ACT IN AERO: 2.0000 | INHALATION_SPRAY | Freq: Two times a day (BID) | RESPIRATORY_TRACT | 5 refills | Status: DC

## 2018-12-20 MED ORDER — ALBUTEROL SULFATE (2.5 MG/3ML) 0.083% IN NEBU: INHALATION_SOLUTION | RESPIRATORY_TRACT | 1 refills | Status: DC

## 2018-12-20 MED ORDER — FLUTICASONE PROPIONATE 50 MCG/ACT NA SUSP: 2.0000 | Freq: Two times a day (BID) | NASAL | 3 refills | Status: DC

## 2018-12-20 NOTE — Progress Notes (Signed)
RE: [name] MRN: [id number] DOB: 07/18/1960 Date of Telemedicine Visit: 12/20/2018  Referring provider: Raliegh IpGottschalk, Ashly M, DO Primary care provider: Raliegh IpGottschalk, Ashly M, DO  Chief Complaint: Asthma and Allergic Rhinitis    Telemedicine Follow Up Visit via Telephone: I connected with [name] for a follow up on 12/20/18 by telephone and verified that I am speaking with the correct person using two identifiers.   I discussed the limitations, risks, security and privacy concerns of performing an evaluation and management service by telephone and the availability of in person appointments. I also discussed with the patient that there may be a patient responsible charge related to this service. The patient expressed understanding and agreed to proceed.  Patient is at home.  Provider is at the office.  Visit start time: 9:49 AM Visit end time: 10:21 AM Insurance consent/check in by: Hilda LiasMarie Medical consent and medical assistant/nurse: Darreld Mcleanrina  History of Present Illness:  She is a 58 y.o. female, who is being followed for persistent asthma and allergic rhinitis. Her previous allergy office visit was in March 2020 with Dr. Dellis AnesGallagher.  When I last saw her, her lung testing looked terrible but improved with the albuterol treatment.  We gave her a Depo-Medrol 40 mg and started on a prednisone burst.  We did talk about starting Fasenra.  We continued Symbicort 160/4.5 mcg 2 puffs twice daily with albuterol as needed.  She has Asmanex that she adds on during respiratory flares.  For the rhinitis, we continue with Flonase as well as cetirizine.  Since the last visit, she has done fairly well. She did forget her appointment yesterday and is interested in doing her visit today instead.   Asthma/Respiratory Symptom History: She remains on the Symbicort two puffs twice daily. She has not needed steroids since that time. She did feel that stuff was "settling" one a couple of occasions. She did not  have to go to the ED or Urgent Care for her symptoms. She never filled out the paperwork for the [facility] because everything shut down. She has to get some income [place] for the [place]. 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 She does cough mostly in the morning. She will cough up a bunch of sputum and endorse some drainage.   Allergic Rhinitis Symptom History: She is on her nose spray and cetirizine, which she takes on a daily basis.She has not been on antibiotics at all since the last visit. She does take her reflux medication which seems to help. Breathing is better overall but the morning drainage is still present. She has not needed antibiotics.   She has a history of scoliosis and [facility]. She has a history of chronic back pain. 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 She has since had to leave and her back pain has worsened. She is in disability and is not happy about it at all. She would much rather be at work.    Otherwise, there have been no changes to her past medical history, surgical history, family history, or social history.  Assessment and Plan:  [name] is a 58 y.o. 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 Moderate persistent asthma, uncomplicated - It seems like the asthma control is good at this point. - Continue to work on getting the [place]ra [place].   - Continue taking the GERD medication on a daily basis since this seems to be working well to decrease your symptoms. - I am hopeful that once we  have Fasenra on board, we can decrease the inhaler medications.  - Daily controller medication(s): Symbicort 160/4.475mcg two puffs twice daily with spacer - Prior to physical activity: ProAir 2 puffs 10-15 minutes before physical activity. - Rescue medications: ProAir 4 puffs every 4-6 hours as needed or albuterol nebulizer one vial every 4-6 hours as needed - Changes during respiratory infections or worsening symptoms: Add on Asmanex 100mcg 2 puffs twice daily for ONE TO TWO WEEKS. - Asthma control goals:  * Full participation in all desired activities (may need albuterol before activity) * Albuterol use two time or less a week on average (not counting use with activity) * Cough interfering with sleep two time or less a month * Oral steroids no more than once a year * No hospitalizations  2. Chronic rhinitis (weeds, indoor molds, outdoor molds, cat, dog, and cockroach) - Continue with fluticasone nasal spray 1-2 sprays per nostril daily. - Continue with cetirizine 10mg  daily.  3. Return in about 3 months (around 03/22/2019). This can be an in-person, a virtual Webex or a telephone follow up visit.   Diagnostics: None.  Medication List:  Current Outpatient Medications  Medication Sig Dispense Refill  . albuterol (PROVENTIL) (2.5 MG/3ML) 0.083% nebulizer solution Nebulize one ampule every 4 hours as needed for cough or wheeze. 75 mL 1  . albuterol (VENTOLIN HFA) 108 (90 Base) MCG/ACT inhaler INHALE TWO PUFFS INTO LUNGS EVERY 6 HOURS AS NEEDED FOR WHEEZING OR SHORTNESS OF BREATH 18 g 6  . budesonide-formoterol (SYMBICORT) 160-4.5 MCG/ACT inhaler Inhale 2 puffs into the lungs 2 (two) times a day. 10.2 g 5  . diclofenac (VOLTAREN) 75 MG EC tablet Take 75 mg by mouth 2 (two) times daily with a meal.  2  . fluticasone (FLONASE) 50 MCG/ACT nasal spray Place 2 sprays into both nostrils 2 (two) times daily. 16 g 3  . ibuprofen (ADVIL,MOTRIN) 800 MG tablet Take 800 mg by mouth every 8 (eight) hours  as needed. Pain  0  . levocetirizine (XYZAL) 5 MG tablet Take 1 tablet (5 mg total) by mouth every evening. 90 tablet 1  . Mometasone Furoate (ASMANEX HFA) 100 MCG/ACT AERO Inhale 2 puffs into the lungs 2 (two) times daily as needed. 13 g 5  . montelukast (SINGULAIR) 10 MG tablet Take 1 tablet (10 mg total) by mouth at bedtime. 30 tablet 5  . morphine (MSIR) 15 MG tablet Take 15 mg by mouth every 8 (eight) hours as needed. Back Pain      . pantoprazole (PROTONIX) 40 MG tablet Take 1 tablet (40 mg total) by mouth daily. 90 tablet 1  . tiZANidine (ZANAFLEX) 4 MG tablet Take 4 mg by mouth 2 (two) times daily as needed.     No current facility-administered medications for this visit.    Allergies: No Known Allergies I reviewed her past medical history, social history, family history, and environmental history and no significant changes have been reported from previous visits.  Review of Systems  Constitutional: Negative for  activity change, appetite change, chills and fever.  HENT: Negative for congestion, postnasal drip, rhinorrhea, sinus pressure and sore throat.   Eyes: Negative for pain, discharge, redness and itching.  Respiratory: Negative for cough, choking, shortness of breath, wheezing and stridor.   Gastrointestinal: Negative for diarrhea, nausea and vomiting.  Musculoskeletal: Negative for arthralgias, joint swelling and myalgias.  Skin: Negative for rash.  Allergic/Immunologic: Negative for environmental allergies, food allergies and immunocompromised state.    Objective:  Physical exam not obtained as encounter was done via telephone.   Previous notes and tests were reviewed.  I discussed the assessment and treatment plan with the patient. The patient was provided an opportunity to ask questions and all were answered. The patient agreed with the plan and demonstrated an understanding of the instructions.   The patient was advised to call back or seek an in-person evaluation  if the symptoms worsen or if the condition fails to improve as anticipated.  I provided 32 minutes of non-face-to-face time during this encounter.  It was my pleasure to participate in [facility] care today. Please feel free to contact me with any questions or concerns.   Sincerely,  Valentina Shaggy, MD

## 2018-12-20 NOTE — Patient Instructions (Addendum)
1. Moderate persistent asthma, uncomplicated - It seems like the asthma control is good at this point. - Continue to work on getting the necessary paperwork for [place] Fasenra approval.   - Continue taking the GERD medication on a daily basis since this seems to be working well to decrease your symptoms. - I am hopeful that once we have [place] on board, we can decrease the inhaler medications.  - Daily controller medication(s): Symbicort 160/4.21mcg two puffs twice daily with spacer - Prior to physical activity: ProAir 2 puffs 10-15 minutes before physical activity. - Rescue medications: ProAir 4 puffs every 4-6 hours as needed or albuterol nebulizer one vial every 4-6 hours as needed - Changes during respiratory infections or worsening symptoms: Add on Asmanex 134mcg 2 puffs twice daily for ONE TO TWO WEEKS. - Asthma control goals:  * Full participation in all desired activities (may need albuterol before activity) * Albuterol use two time or less a week on average (not counting use with activity) * Cough interfering with sleep two time or less a month * Oral steroids no more than once a year * No hospitalizations  2. Chronic rhinitis (weeds, indoor molds, outdoor molds, cat, dog, and cockroach) - Continue with fluticasone nasal spray 1-2 sprays per nostril daily. - Continue with cetirizine 10mg  daily.  3. Return in about 3 months (around 03/22/2019). This can be an in-person, a virtual Webex or a telephone follow up visit.   Please inform us of any Emergency Department visits, hospitalizations, or changes in symptoms. Call us before going to the ED for breathing or allergy symptoms since we might be able to fit you in for a sick visit. Feel free to contact us anytime with any questions, problems, or concerns.  It was a pleasure to talk to you today today!  Websites that have reliable patient information: 1. American Academy of Asthma, Allergy, and Immunology: www.aaaai.org 2. Food  Allergy Research and Education (FARE): foodallergy.org 3. Mothers of Asthmatics: http://www.asthmacommunitynetwork.org 4. American College of Allergy, Asthma, and Immunology: www.acaai.org  "Like" [place] on Facebook and Instagram for our latest updates!      Make sure you are registered to vote! If you have moved or changed any of your contact information, you will need to get this updated before voting!  In some cases, you MAY be able to register to vote online: [email or web address]    Voter ID laws are NOT going into effect for the General Election in November 2020! DO NOT let this stop you from exercising your right to vote!   Absentee voting is the SAFEST way to vote during the coronavirus pandemic!   Download and print an absentee ballot request form at [email or web address] or you can scan the QR code below with your smart phone:      More information on absentee ballots can be found here: [email or web address]

## 2018-12-21 ENCOUNTER — Other Ambulatory Visit: Payer: Self-pay | Admitting: *Deleted

## 2019-03-13 ENCOUNTER — Other Ambulatory Visit: Payer: Self-pay

## 2019-03-13 ENCOUNTER — Ambulatory Visit (INDEPENDENT_AMBULATORY_CARE_PROVIDER_SITE_OTHER): Payer: Medicare Other | Admitting: Allergy & Immunology

## 2019-03-13 ENCOUNTER — Encounter: Payer: Self-pay | Admitting: Allergy & Immunology

## 2019-03-13 VITALS — BP 120/70 | HR 62 | Temp 97.7°F | Resp 16 | Ht 67.0 in | Wt 176.4 lb

## 2019-03-13 DIAGNOSIS — J3089 Other allergic rhinitis: Secondary | ICD-10-CM | POA: Diagnosis not present

## 2019-03-13 DIAGNOSIS — K219 Gastro-esophageal reflux disease without esophagitis: Secondary | ICD-10-CM | POA: Diagnosis not present

## 2019-03-13 DIAGNOSIS — J302 Other seasonal allergic rhinitis: Secondary | ICD-10-CM | POA: Diagnosis not present

## 2019-03-13 DIAGNOSIS — J4540 Moderate persistent asthma, uncomplicated: Secondary | ICD-10-CM

## 2019-03-13 MED ORDER — AZELASTINE HCL 0.1 % NA SOLN: 2.0000 | Freq: Two times a day (BID) | NASAL | 6 refills | Status: DC | PRN

## 2019-03-13 NOTE — Progress Notes (Signed)
FOLLOW UP  Date of Service/Encounter:  03/13/19   Assessment:   Moderate persistent asthmawith acute exacerbation - with eosinophilic phenotype (AEC 312 in June 2019)  Seasonal and perennial allergic rhinitis(indoor and outdoor molds, cat, dog, cockroach)  Chronic back pain - with a history of scoliosis (rods placement in 2010) and now on disability   Plan/Recommendations:   1. Moderate persistent asthma, uncomplicated - You were wheezing throughout your lungs today. - Add on the Asmanex through the weekend in addition to your Symbicort. - Prednisone pack provided to start in case symptoms worsen over the weekend.  - Continue to work on getting the necessary [place] for [place] Fasenra [place].   - Call [name] in [place] if you need more information.  - Daily controller medication(s): Symbicort 160/4.475mcg two puffs twice daily with spacer - Prior to physical activity: ProAir 2 puffs 10-15 minutes before physical activity. - Rescue medications: ProAir 4 puffs every 4-6 hours as needed or albuterol nebulizer one vial every 4-6 hours as needed - Changes during respiratory infections or worsening symptoms: Add on Asmanex 100mcg 2 puffs twice daily for ONE TO TWO WEEKS. - Asthma control goals:  * Full participation in all desired activities (may need albuterol before activity) * Albuterol use two time or less a week on average (not counting use with activity) * Cough interfering with sleep two time or less a month * Oral steroids no more than once a year * No hospitalizations  2. Chronic rhinitis (weeds, indoor molds, outdoor molds, cat, dog, and cockroach) - Continue with fluticasone nasal spray 1-2 sprays per nostril daily. - Add on Astelin 2 sprays per nostril up to twice daily (histamine blocker).  - Continue with cetirizine 10mg  daily.  3. Return in about 4 months (around 07/13/2019). This can be an in-person, a virtual Webex or a telephone follow up visit. Good  luck with the remodel this year!   Subjective:   [name] is a 58 y.o. female presenting today for follow up of  Chief Complaint  Patient presents with  . Asthma    Flare last week.  Neb treatments help    Michelle FreshwaterCathy W Fritz has a history of the following: Patient Active Problem List   Diagnosis Date Noted  . Seasonal and perennial allergic rhinitis 04/15/2018  . 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 Chronic insomnia 11/29/2016  . Eosinophilia 03/29/2016  . Moderate persistent asthma, uncomplicated 03/29/2016  . 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 Other social stressor 03/29/2016  . 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 Arthritis of knee, degenerative 07/09/2015  . Chronic low back pain 04/15/2015  . Cough variant asthma 08/09/2014  . Pulmonary infiltrates with eosinophilia (HCC) 08/09/2014    History obtained from: chart review and patient.  Michelle AngCathy is a 58 y.o. female presenting for a follow up visit. She was last seen in June 2020. At that time, she was having some breakthrough symptoms. We did recommend continuing the GERD medications on a daily basis. We also continued Symbicort two puffs BID with albuterol as needed. We did recommend starting Fasenra, but she had not filled out the necessary paperwork. For her rhinitis, we continued with fluticasone daily as well as cetirizine 10mg  daily.  Since the last visit, she continues to take daily Symbicort and PRN Albuterol. She has used albuterol 2-3 days per week (2-3 times each day) due to cough sometimes with associated SOB and/or wheezing.  She has  not needed any steroids since her last visit. 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 She has not required steroids since her last visit.  She continues to take daily Zyrtec and Flonase for her allergic rhinitis with good control. She  continues to have some mild postnasal drip with associated cough. Symptoms are worse when working outdoors.   During her kitchen remodel she has been wearing a well-sealed mask (unclear if N95 or other) with a filter. This started off as a weekend project and then become a large project.   Otherwise, there have been no changes to her past medical history, surgical history, family history, or social history.    Review of Systems  Constitutional: Negative.  Negative for chills, fever, malaise/fatigue and weight loss.  HENT: Negative.  Negative for congestion, ear discharge, ear pain and sore throat.   Eyes: Negative for pain, discharge and redness.  Respiratory: Positive for shortness of breath and wheezing. Negative for cough and sputum production.   Cardiovascular: Negative.  Negative for chest pain and palpitations.  Gastrointestinal: Negative for abdominal pain, constipation, diarrhea, heartburn, nausea and vomiting.  Skin: Negative.  Negative for itching and rash.  Neurological: Negative for dizziness and headaches.  Endo/Heme/Allergies: Negative for environmental allergies. Does not bruise/bleed easily.       Objective:   Blood pressure 120/70, pulse 62, temperature 97.7 F (36.5 C), temperature source Temporal, resp. rate 16, height 5\' 7"  (1.702 m), weight 176 lb 6.4 oz (80 kg), last menstrual period 08/30/2002, SpO2 96 %. Body mass index is 27.63 kg/m.   Physical Exam:  Physical Exam  Constitutional: She appears well-developed.  Breathing comfortably and talking in full sentences.  HENT:  Head: Normocephalic and atraumatic.  Right Ear: Tympanic membrane, external ear and ear canal normal.  Left Ear: Tympanic membrane, external ear and ear canal normal.  Nose: Mucosal edema and rhinorrhea present. No nasal deformity or septal deviation. No epistaxis. Right sinus exhibits no maxillary sinus tenderness and no frontal sinus tenderness. Left sinus exhibits no maxillary sinus  tenderness and no frontal sinus tenderness.  Mouth/Throat: Uvula is midline and oropharynx is clear and moist. Mucous membranes are not pale and not dry.  Eyes: Pupils are equal, round, and reactive to light. Conjunctivae and EOM are normal. Right eye exhibits no chemosis and no discharge. Left eye exhibits no chemosis and no discharge. Right conjunctiva is not injected. Left conjunctiva is not injected.  Cardiovascular: Normal rate, regular rhythm and normal heart sounds.  Respiratory: Effort normal and breath sounds normal. No accessory muscle usage. No tachypnea. No respiratory distress. She has no wheezes. She has no rhonchi. She has no rales. She exhibits no tenderness.  Wheezing in the majority of the lung fields, but moving air in all lung fields.   Lymphadenopathy:    She has no cervical adenopathy.  Neurological: She is alert.  Skin: No abrasion, no petechiae and no rash noted. Rash is not papular, not vesicular and not urticarial. No erythema. No pallor.  No eczematous or urticarial lesions noted.   Psychiatric: She has a normal mood and affect.     Diagnostic studies:    Spirometry: results normal (FEV1: 1.83/70%, FVC: 2.41/69%, FEV1/FVC: 76%).    Spirometry consistent with normal pattern.   Allergy Studies: none        Salvatore Marvel, MD  [facility] and [place] [facility] [place]

## 2019-03-13 NOTE — Patient Instructions (Addendum)
1. Moderate persistent asthma, uncomplicated - You were wheezing throughout your lungs today. - Add on the Asmanex through the weekend in addition to your Symbicort. - Prednisone pack provided to start in case symptoms worsen over the weekend.  - Continue to work on getting the necessary paperwork for [place] [employer] [place] Fasenra approval.   - Call [name] in [place] if you need more information.  - Daily controller medication(s): Symbicort 160/4.24mcg two puffs twice daily with spacer - Prior to physical activity: ProAir 2 puffs 10-15 minutes before physical activity. - Rescue medications: ProAir 4 puffs every 4-6 hours as needed or albuterol nebulizer one vial every 4-6 hours as needed - Changes during respiratory infections or worsening symptoms: Add on Asmanex 143mcg 2 puffs twice daily for ONE TO TWO WEEKS. - Asthma control goals:  * Full participation in all desired activities (may need albuterol before activity) * Albuterol use two time or less a week on average (not counting use with activity) * Cough interfering with sleep two time or less a month * Oral steroids no more than once a year * No hospitalizations  2. Chronic rhinitis (weeds, indoor molds, outdoor molds, cat, dog, and cockroach) - Continue with fluticasone nasal spray 1-2 sprays per nostril daily. - Add on Astelin 2 sprays per nostril up to twice daily (histamine blocker).  - Continue with cetirizine 10mg  daily.  3. Return in about 4 months (around 07/13/2019). This can be an in-person, a virtual Webex or a telephone follow up visit. Good luck with the remodel this year!    Please inform us of any Emergency Department visits, hospitalizations, or changes in symptoms. Call us before going to the ED for breathing or allergy symptoms since we might be able to fit you in for a sick visit. Feel free to contact us anytime with any questions, problems, or concerns.  It was a pleasure to see you again today!  Websites that have reliable  patient information: 1. American Academy of Asthma, Allergy, and Immunology: www.aaaai.org 2. Food Allergy Research and Education (FARE): foodallergy.org 3. Mothers of Asthmatics: http://www.asthmacommunitynetwork.org 4. American College of Allergy, Asthma, and Immunology: www.acaai.org  "Like" [place] on Facebook and Instagram for our latest updates!      Make sure you are registered to vote! If you have moved or changed any of your contact information, you will need to get this updated before voting!  In some cases, you MAY be able to register to vote online: [email or web address]    Voter ID laws are NOT going into effect for the General Election in November 2020! DO NOT let this stop you from exercising your right to vote!   Absentee voting is the SAFEST way to vote during the coronavirus pandemic!   Download and print an absentee ballot request form at [email or web address] or you can scan the QR code below with your smart phone:      More information on absentee ballots can be found here: [email or web address]

## 2019-03-19 ENCOUNTER — Telehealth: Payer: Self-pay

## 2019-03-19 MED ORDER — PREDNISONE 10 MG PO TABS: ORAL_TABLET | ORAL | 0 refills | Status: DC

## 2019-03-19 NOTE — Telephone Encounter (Signed)
Please advise 

## 2019-03-19 NOTE — Telephone Encounter (Signed)
I will send in a longer taper for her.  Salvatore Marvel, MD All[facility] and [place] [facility] [place]

## 2019-03-19 NOTE — Telephone Encounter (Signed)
Pt informed that pred was sent and said she really appreciates [name] doing that.

## 2019-03-19 NOTE — Telephone Encounter (Signed)
Patient is still having Deep wheezing & mucus in her lungs.  Patient is requesting more prednisone at a low dose to knock this out.    [facility]   Please Advise

## 2019-07-17 ENCOUNTER — Ambulatory Visit: Payer: Medicare Other | Admitting: Allergy & Immunology

## 2019-07-24 ENCOUNTER — Other Ambulatory Visit: Payer: Self-pay

## 2019-07-24 ENCOUNTER — Ambulatory Visit (INDEPENDENT_AMBULATORY_CARE_PROVIDER_SITE_OTHER): Payer: Medicare Other | Admitting: Allergy & Immunology

## 2019-07-24 ENCOUNTER — Encounter: Payer: Self-pay | Admitting: Allergy & Immunology

## 2019-07-24 VITALS — BP 128/74 | HR 60 | Temp 97.4°F | Resp 18

## 2019-07-24 DIAGNOSIS — J4540 Moderate persistent asthma, uncomplicated: Secondary | ICD-10-CM | POA: Diagnosis not present

## 2019-07-24 DIAGNOSIS — J302 Other seasonal allergic rhinitis: Secondary | ICD-10-CM | POA: Diagnosis not present

## 2019-07-24 DIAGNOSIS — J3089 Other allergic rhinitis: Secondary | ICD-10-CM | POA: Diagnosis not present

## 2019-07-24 NOTE — Patient Instructions (Addendum)
1. Moderate persistent asthma, uncomplicated - Lung testing looked excellent today. - We will defer on starting the injectable medicine since you are doing well now.  - Daily controller medication(s): Symbicort 160/4.5mcg two puffs twice daily with spacer - Prior to physical activity: ProAir 2 puffs 10-15 minutes before physical activity. - Rescue medications: ProAir 4 puffs every 4-6 hours as needed or albuterol nebulizer one vial every 4-6 hours as needed - Changes during respiratory infections or worsening symptoms: Add on Asmanex 2 puffs twice daily for ONE TO TWO WEEKS. - Asthma control goals:  * Full participation in all desired activities (may need albuterol before activity) * Albuterol use two time or less a week on average (not counting use with activity) * Cough interfering with sleep two time or less a month * Oral steroids no more than once a year * No hospitalizations  2. Chronic rhinitis (weeds, indoor molds, outdoor molds, cat, dog, and cockroach) - Continue with fluticasone nasal spray 1-2 sprays per nostril daily. - Continue with Astelin 2 sprays per nostril up to twice daily as needed.  - Continue with cetirizine 10mg  daily.  3. Return in about 6 months (around 01/21/2020). This can be an in-person, a virtual Webex or a telephone follow up visit.   Please inform 01/23/2020 of any Emergency Department visits, hospitalizations, or changes in symptoms. Call us before going to the ED for breathing or allergy symptoms since we might be able to fit you in for a sick visit. Feel free to contact us anytime with any questions, problems, or concerns.  It was a pleasure to see you again today!  Websites that have reliable patient information: 1. American Academy of Asthma, Allergy, and Immunology: www.aaaai.org 2. Food Allergy Research and Education (FARE): foodallergy.org 3. Mothers of Asthmatics: http://www.asthmacommunitynetwork.org 4. American College of Allergy, Asthma, and  Immunology: www.acaai.org   COVID-19 Vaccine Information can be found at: [place] For questions related to vaccine distribution or appointments, please email [email or web address] or call [phone].     "Like" [phone] on Facebook and Instagram for our latest updates!        Make sure you are registered to vote! If you have moved or changed any of your contact information, you will need to get this updated before voting!  In some cases, you MAY be able to register to vote online: [place]

## 2019-07-24 NOTE — Progress Notes (Signed)
FOLLOW UP  Date of Service/Encounter:  07/24/19   Assessment:   Moderate persistent asthma, uncomplicated - better controlled today  Seasonal and perennial allergic rhinitis(indoor and outdoor molds, cat, dog, cockroach)  Chronic back pain - with a history of scoliosis (rods placement in 2010) and now on disability   Ms. Garn is doing very well with her current regimen.  She thinks controlling her postnasal drip has led to improvement in her asthma control and this is certainly a possibility.  Her breathing test today looks fantastic and her albuterol use is excellent, so we will just continue on with the current course for now.  We can always revisit the biologic in the future if needed.  However, she seems her stabilized at this point.  Plan/Recommendations:   1. Moderate persistent asthma, uncomplicated - Lung testing looked excellent today. - We will defer on starting the injectable medicine since you are doing well now.  - Daily controller medication(s): Symbicort 160/4.60mcg two puffs twice daily with spacer - Prior to physical activity: ProAir 2 puffs 10-15 minutes before physical activity. - Rescue medications: ProAir 4 puffs every 4-6 hours as needed or albuterol nebulizer one vial every 4-6 hours as needed - Changes during respiratory infections or worsening symptoms: Add on Asmanex 2 puffs twice daily for ONE TO TWO WEEKS. - Asthma control goals:  * Full participation in all desired activities (may need albuterol before activity) * Albuterol use two time or less a week on average (not counting use with activity) * Cough interfering with sleep two time or less a month * Oral steroids no more than once a year * No hospitalizations  2. Chronic rhinitis (weeds, indoor molds, outdoor molds, cat, dog, and cockroach) - Continue with fluticasone nasal spray 1-2 sprays per nostril daily. - Continue with Astelin 2 sprays per nostril up to twice daily as needed.  -  Continue with cetirizine 10mg  daily.  3. Return in about 6 months (around 01/21/2020). This can be an in-person, a virtual Webex or a telephone follow up visit.   Subjective:   JONESSA TRIPLETT is a 59 y.o. female presenting today for follow up of  Chief Complaint  Patient presents with  . Asthma    No problems.    CLYTIE SHETLEY has a history of the following: Patient Active Problem List   Diagnosis Date Noted  . Seasonal and perennial allergic rhinitis 04/15/2018  . 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 Chronic insomnia 11/29/2016  . Eosinophilia 03/29/2016  . Moderate persistent asthma, uncomplicated 03/29/2016  . 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 Other social stressor 03/29/2016  . 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 Arthritis of knee, degenerative 07/09/2015  . Chronic low back pain 04/15/2015  . Cough variant asthma 08/09/2014  . Pulmonary infiltrates with eosinophilia 08/09/2014    History obtained from: chart review and patient.  Goddess is a 59 y.o. female presenting for a follow up visit.  I last saw her in September 2020.  At that time, she was not doing well from a breathing perspective.  She was wheezing throughout her lungs.  We did give her a prednisone Dosepak to start if needed.  We added Asmanex through the weekend in addition to her Symbicort.  We have also discussed adding a biologic to her regimen and we filled out the paperwork for the co-pay assistance.  However, in the interim, she decided not to pursue the biologic asthma  therapy.  Since last visit, she has done well.  She thinks that the addition of the Astelin has helped the most.  Asthma/Respiratory Symptom History: She has done well on the Symbicort. The last time that she used her rescue inhaler was a few weeks ago. She does have some postnasal drip, likely secondary to a new HVAC system installed. She thinks this led to a problem with increasing  particulates in the air since they had to use the baseboard heating for supplement.  Alvah's asthma has been well controlled. She has not required rescue medication, experienced nocturnal awakenings due to lower respiratory symptoms, nor have activities of daily living been limited. She has required no Emergency Department or Urgent Care visits for her asthma. She has required zero courses of systemic steroids for asthma exacerbations since the last visit. ACT score today is 24, indicating excellent asthma symptom control.   Allergic Rhinitis Symptom History: She remains on Astelin 1 spray per nostril twice daily.  She also has antihistamine on board as well.  She has not needed any antibiotics at all since last visit.  She does not appreciate the taste of the Astelin, but otherwise she has no issues with it.  Otherwise, there have been no changes to her past medical history, surgical history, family history, or social history.    Review of Systems  Constitutional: Negative.  Negative for chills, fever, malaise/fatigue and weight loss.  HENT: Negative.  Negative for congestion, ear discharge and ear pain.   Eyes: Negative for pain, discharge and redness.  Respiratory: Negative for cough, sputum production, shortness of breath and wheezing.   Cardiovascular: Negative.  Negative for chest pain and palpitations.  Gastrointestinal: Negative for abdominal pain, constipation, diarrhea, heartburn, nausea and vomiting.  Skin: Negative.  Negative for itching and rash.  Neurological: Negative for dizziness and headaches.  Endo/Heme/Allergies: Negative for environmental allergies. Does not bruise/bleed easily.       Objective:   Blood pressure 128/74, pulse 60, temperature (!) 97.4 F (36.3 C), temperature source Temporal, resp. rate 18, last menstrual period 08/30/2002, SpO2 97 %. There is no height or weight on file to calculate BMI.   Physical Exam:  Physical Exam  Constitutional: She appears  well-developed.  Very pleasant female.  Talkative.  HENT:  Head: Normocephalic and atraumatic.  Right Ear: Tympanic membrane, external ear and ear canal normal.  Left Ear: Tympanic membrane, external ear and ear canal normal.  Nose: Mucosal edema and rhinorrhea present. No nasal deformity or septal deviation. No epistaxis. Right sinus exhibits no maxillary sinus tenderness and no frontal sinus tenderness. Left sinus exhibits no maxillary sinus tenderness and no frontal sinus tenderness.  Mouth/Throat: Uvula is midline and oropharynx is clear and moist. Mucous membranes are not pale and not dry.  Turbinates edematous bilaterally.  Eyes: Pupils are equal, round, and reactive to light. Conjunctivae and EOM are normal. Right eye exhibits no chemosis and no discharge. Left eye exhibits no chemosis and no discharge. Right conjunctiva is not injected. Left conjunctiva is not injected.  Cardiovascular: Normal rate, regular rhythm and normal heart sounds.  Respiratory: Effort normal and breath sounds normal. No accessory muscle usage. No tachypnea. No respiratory distress. She has no wheezes. She has no rhonchi. She has no rales. She exhibits no tenderness.  No increased work of breathing.  No crackles or wheezes noted.  Lymphadenopathy:    She has no cervical adenopathy.  Neurological: She is alert.  Skin: No abrasion, no petechiae and no rash  noted. Rash is not papular, not vesicular and not urticarial. No erythema. No pallor.  No eczematous lesions.  Psychiatric: She has a normal mood and affect.     Diagnostic studies:    Spirometry: results normal (FEV1: 2.01/77%, FVC: 2.82/80%, FEV1/FVC: 71%).    Spirometry consistent with normal pattern.   Allergy Studies: none       Malachi Bonds, MD  [facility]

## 2019-08-02 ENCOUNTER — Telehealth: Payer: Self-pay

## 2019-08-02 ENCOUNTER — Other Ambulatory Visit: Payer: Self-pay | Admitting: *Deleted

## 2019-08-02 MED ORDER — ARNUITY ELLIPTA 100 MCG/ACT IN AEPB: 1.0000 | INHALATION_SPRAY | Freq: Every day | RESPIRATORY_TRACT | 2 refills | Status: DC

## 2019-08-02 NOTE — Telephone Encounter (Signed)
Received fax from pharmacy stating patients Asmanex is not covered by insurance. Patient is on Symbicort as her daily maintenance inhaler, and Asmanex is only added in during respiratory infections.  Other covered options according to fax are: Arnuity Ellipta 100, Breo Ellipta 100, Advair HFA 115, Flovent 110, Pulmicort 180, Flovent Diskus 100.   Please advise.

## 2019-08-02 NOTE — Addendum Note (Signed)
Addended by: Ander Purpura R on: 08/02/2019 04:17 PM   Modules accepted: Orders

## 2019-08-02 NOTE — Telephone Encounter (Signed)
[name] has sent prescription in to [facility]. Called patient and left a voicemail asking to return call to inform.

## 2019-08-02 NOTE — Telephone Encounter (Signed)
We can go with Arnuity one puff once daily.   Malachi Bonds, MD [facility]

## 2019-08-06 NOTE — Telephone Encounter (Signed)
Called patient and informed of change in inhaler. Patient verbalized understanding.

## 2019-08-21 ENCOUNTER — Ambulatory Visit (INDEPENDENT_AMBULATORY_CARE_PROVIDER_SITE_OTHER): Payer: Medicare Other | Admitting: Family Medicine

## 2019-08-21 ENCOUNTER — Encounter: Payer: Self-pay | Admitting: Family Medicine

## 2019-08-21 ENCOUNTER — Other Ambulatory Visit: Payer: Self-pay

## 2019-08-21 VITALS — BP 138/79 | HR 63 | Temp 98.0°F | Ht 67.0 in | Wt 183.0 lb

## 2019-08-21 DIAGNOSIS — Z8249 Family history of ischemic heart disease and other diseases of the circulatory system: Secondary | ICD-10-CM

## 2019-08-21 DIAGNOSIS — E663 Overweight: Secondary | ICD-10-CM

## 2019-08-21 DIAGNOSIS — Z130 Encounter for screening for diseases of the blood and blood-forming organs and certain disorders involving the immune mechanism: Secondary | ICD-10-CM | POA: Diagnosis not present

## 2019-08-21 DIAGNOSIS — R030 Elevated blood-pressure reading, without diagnosis of hypertension: Secondary | ICD-10-CM | POA: Diagnosis not present

## 2019-08-21 DIAGNOSIS — J45991 Cough variant asthma: Secondary | ICD-10-CM

## 2019-08-21 NOTE — Progress Notes (Signed)
Subjective: CC: Interval follow-up for asthma, GERD PCP: Janora Norlander, DO YHT:MBPJP W Patnode is a 59 y.o. female presenting to clinic today for:  1.  Asthma Patient reports fairly good control of her breathing with Symbicort and as needed albuterol.  She also has Arnuity prescribed for as needed use by her asthma and allergy doctor.  She notes that not too long ago Astelin was added to her regimen and has helped substantially with her rhinorrhea.  She continues Singulair and Xyzal as prescribed.  She has had some intermittent wheezing but feels that she is tolerating activity normally.  No hemoptysis.  2.  Early family history of cardiovascular disease Patient notes that both her brother and sister have both been diagnosed with coronary artery disease and had multiple occluded vessels within the heart.  Her sister was diagnosed at age 62 and brother diagnosed at age 23.  She notes that they "do not take care of themselves" but wonders if she should be worried about herself.  She notes that she could use some weight loss and that she recognizes her blood pressure is above her goal.  No chest pain, shortness of breath, lower extremity edema or visual disturbance.  She is a non-smoker.   ROS: Per HPI  No Known Allergies Past Medical History:  Diagnosis Date  . Arthritis    spine, R knee  . Asthma   . Back pain   . Blood transfusion without reported diagnosis    back surgery  . Cough 09/03/2014   Followed in Pulmonary clinic/ [facility]/ [place] - sinus CT 09/08/2014> Mild mucosal thickening involves the paranasal sinuses  - Eos 1.2  08/06/14 - Allergy profile 11/27/14 >  Eos 1.0/ IgE  41 with neg RAST    . GERD (gastroesophageal reflux disease)     Current Outpatient Medications:  .  albuterol (VENTOLIN HFA) 108 (90 Base) MCG/ACT inhaler, INHALE TWO PUFFS INTO LUNGS EVERY 6 HOURS AS NEEDED FOR WHEEZING OR SHORTNESS OF BREATH, Disp: 18 g, Rfl: 6 .  ARNUITY ELLIPTA 100 MCG/ACT AEPB,  Inhale 1 puff into the lungs daily. For 1-2 weeks during signs and symptoms of upper respiratory infection., Disp: 30 each, Rfl: 2 .  azelastine (ASTELIN) 0.1 % nasal spray, Place 2 sprays into both nostrils 2 (two) times daily as needed for rhinitis. Use in each nostril as directed, Disp: 30 mL, Rfl: 6 .  budesonide-formoterol (SYMBICORT) 160-4.5 MCG/ACT inhaler, Inhale 2 puffs into the lungs 2 (two) times a day., Disp: 10.2 g, Rfl: 5 .  diclofenac (VOLTAREN) 75 MG EC tablet, Take 75 mg by mouth 2 (two) times daily with a meal., Disp: , Rfl: 2 .  fluticasone (FLONASE) 50 MCG/ACT nasal spray, Place 2 sprays into both nostrils 2 (two) times daily., Disp: 16 g, Rfl: 3 .  levocetirizine (XYZAL) 5 MG tablet, Take 1 tablet (5 mg total) by mouth every evening., Disp: 90 tablet, Rfl: 1 .  montelukast (SINGULAIR) 10 MG tablet, Take 1 tablet (10 mg total) by mouth at bedtime., Disp: 30 tablet, Rfl: 5 .  morphine (MSIR) 15 MG tablet, Take 15 mg by mouth every 8 (eight) hours as needed. Back Pain  , Disp: , Rfl:  .  pantoprazole (PROTONIX) 40 MG tablet, Take 1 tablet (40 mg total) by mouth daily., Disp: 90 tablet, Rfl: 1 .  tiZANidine (ZANAFLEX) 4 MG tablet, Take 4 mg by mouth 2 (two) times daily as needed., Disp: , Rfl:  Social History  Socioeconomic History  . Marital status: Significant Other    Spouse name: [name]  . Number of children: 2  . Years of education: 13  . Highest education level: Not on file  Occupational History  . Occupation: disabled    Comment: back  Tobacco Use  . Smoking status: Never Smoker  . Smokeless tobacco: Never Used  Substance and Sexual Activity  . Alcohol use: No  . Drug use: No  . Sexual activity: Yes    Birth control/protection: Surgical  Other Topics Concern  . Not on file  Social History Narrative   Lives with [name] - 33 years   [name] has end stage colon cancer   Two sons/ grandchildren   Disabled from back pain/had scoliosis. Has had back reconstruction.      Dr. Hulda Marin doctor.    Social Determinants of Health   Financial Resource Strain:   . Difficulty of Paying Living Expenses: Not on file  Food Insecurity:   . Worried About Charity fundraiser in the Last Year: Not on file  . Ran Out of Food in the Last Year: Not on file  Transportation Needs:   . Lack of Transportation (Medical): Not on file  . Lack of Transportation (Non-Medical): Not on file  Physical Activity:   . Days of Exercise per Week: Not on file  . Minutes of Exercise per Session: Not on file  Stress:   . Feeling of Stress : Not on file  Social Connections:   . Frequency of Communication with Friends and Family: Not on file  . Frequency of Social Gatherings with Friends and Family: Not on file  . Attends Religious Services: Not on file  . Active Member of Clubs or Organizations: Not on file  . Attends Archivist Meetings: Not on file  . Marital Status: Not on file  Intimate Partner Violence:   . Fear of Current or Ex-Partner: Not on file  . Emotionally Abused: Not on file  . Physically Abused: Not on file  . Sexually Abused: Not on file   Family History  Problem Relation Age of Onset  . Emphysema Maternal Grandmother        never smoker  . COPD Maternal Grandmother   . Emphysema Maternal Uncle        smoked  . COPD Maternal Uncle   . Arthritis Mother   . Diabetes Mother   . Hyperlipidemia Mother   . Hypertension Mother   . Early death Father 84       MVA  . Asthma Sister   . Asthma Brother   . Heart disease Maternal Grandfather     Objective: Office vital signs reviewed. BP 138/79   Pulse 63   Temp 98 F (36.7 C) (Temporal)   Ht '5\' 7"'  (1.702 m)   Wt 183 lb (83 kg)   LMP 08/30/2002 (Approximate)   SpO2 93%   BMI 28.66 kg/m   Physical Examination:  General: Awake, alert, well nourished, well appearing. No acute distress HEENT: Normal; sclera white.  Moist mucous membranes.  No carotid bruits. Cardio: regular rate and rhythm,  S1S2 heard, no murmurs appreciated Pulm: clear to auscultation bilaterally, mild expiratory wheezes, no rhonchi or rales; normal work of breathing on room air Extremities: warm, well perfused, No edema, cyanosis or clubbing; +2 pulses bilaterally MSK: normal gait and station  Assessment/ Plan: 59 y.o. female   1. Elevated blood-pressure reading, without diagnosis of hypertension We discussed that her blood pressure reading  is technically within hypertensive range.  I reinforced DASH diet and a handout was provided today.  She will come in for fasting labs. - CMP14+EGFR; Future  2. Family history of early CAD She wishes to have eval by cardiology given her family history.  I think this is reasonable.  She may benefit from stress testing.  Again we discussed ways to reduce risk of cardiovascular disease including controlled blood pressure, blood sugar and staying physically active with a balanced diet. - CMP14+EGFR; Future - Lipid panel; Future - CBC; Future - Ambulatory referral to Cardiology  3. Overweight (BMI 25.0-29.9) - Lipid panel; Future - CBC; Future - Bayer DCA Hb A1c Waived; Future  4. Screening, anemia, deficiency, iron - CBC; Future  5. Cough variant asthma Managed by asthma and allergy.  She did have some expiratory wheezes noted on today's exam.   No orders of the defined types were placed in this encounter.  No orders of the defined types were placed in this encounter.    Janora Norlander, DO [place] [phone]

## 2019-08-21 NOTE — Patient Instructions (Signed)
Come in for fasting labs   DASH Eating Plan DASH stands for "Dietary Approaches to Stop Hypertension." The DASH eating plan is a healthy eating plan that has been shown to reduce high blood pressure (hypertension). It may also reduce your risk for type 2 diabetes, heart disease, and stroke. The DASH eating plan may also help with weight loss. What are tips for following this plan?  General guidelines  Avoid eating more than 2,300 mg (milligrams) of salt (sodium) a day. If you have hypertension, you may need to reduce your sodium intake to 1,500 mg a day.  Limit alcohol intake to no more than 1 drink a day for nonpregnant women and 2 drinks a day for men. One drink equals 12 oz of beer, 5 oz of wine, or 1 oz of hard liquor.  Work with your health care provider to maintain a healthy body weight or to lose weight. Ask what an ideal weight is for you.  Get at least 30 minutes of exercise that causes your heart to beat faster (aerobic exercise) most days of the week. Activities may include walking, swimming, or biking.  Work with your health care provider or diet and nutrition specialist (dietitian) to adjust your eating plan to your individual calorie needs. Reading food labels   Check food labels for the amount of sodium per serving. Choose foods with less than 5 percent of the Daily Value of sodium. Generally, foods with less than 300 mg of sodium per serving fit into this eating plan.  To find whole grains, look for the word "whole" as the first word in the ingredient list. Shopping  Buy products labeled as "low-sodium" or "no salt added."  Buy fresh foods. Avoid canned foods and premade or frozen meals. Cooking  Avoid adding salt when cooking. Use salt-free seasonings or herbs instead of table salt or sea salt. Check with your health care provider or pharmacist before using salt substitutes.  Do not fry foods. Cook foods using healthy methods such as baking, boiling, grilling, and  broiling instead.  Cook with heart-healthy oils, such as olive, canola, soybean, or sunflower oil. Meal planning  Eat a balanced diet that includes: ? 5 or more servings of fruits and vegetables each day. At each meal, try to fill half of your plate with fruits and vegetables. ? Up to 6-8 servings of whole grains each day. ? Less than 6 oz of lean meat, poultry, or fish each day. A 3-oz serving of meat is about the same size as a deck of cards. One egg equals 1 oz. ? 2 servings of low-fat dairy each day. ? A serving of nuts, seeds, or beans 5 times each week. ? Heart-healthy fats. Healthy fats called Omega-3 fatty acids are found in foods such as flaxseeds and coldwater fish, like sardines, salmon, and mackerel.  Limit how much you eat of the following: ? Canned or prepackaged foods. ? Food that is high in trans fat, such as fried foods. ? Food that is high in saturated fat, such as fatty meat. ? Sweets, desserts, sugary drinks, and other foods with added sugar. ? Full-fat dairy products.  Do not salt foods before eating.  Try to eat at least 2 vegetarian meals each week.  Eat more home-cooked food and less restaurant, buffet, and fast food.  When eating at a restaurant, ask that your food be prepared with less salt or no salt, if possible. What foods are recommended? The items listed may not be a  complete list. Talk with your dietitian about what dietary choices are best for you. Grains Whole-grain or whole-wheat bread. Whole-grain or whole-wheat pasta. Brown rice. [place] Morrow. Bulgur. Whole-grain and low-sodium cereals. Pita bread. Low-fat, low-sodium crackers. Whole-wheat flour tortillas. Vegetables Fresh or frozen vegetables (raw, steamed, roasted, or grilled). Low-sodium or reduced-sodium tomato and vegetable juice. Low-sodium or reduced-sodium tomato sauce and tomato paste. Low-sodium or reduced-sodium canned vegetables. Fruits All fresh, dried, or frozen fruit. Canned  fruit in natural juice (without added sugar). Meat and other protein foods Skinless chicken or [place]. Ground chicken or [place]. Pork with fat trimmed off. Fish and seafood. Egg whites. Dried beans, peas, or lentils. Unsalted nuts, nut butters, and seeds. Unsalted canned beans. Lean cuts of beef with fat trimmed off. Low-sodium, lean deli meat. Dairy Low-fat (1%) or fat-free (skim) milk. Fat-free, low-fat, or reduced-fat cheeses. Nonfat, low-sodium ricotta or cottage cheese. Low-fat or nonfat yogurt. Low-fat, low-sodium cheese. Fats and oils Soft margarine without trans fats. Vegetable oil. Low-fat, reduced-fat, or light mayonnaise and salad dressings (reduced-sodium). Canola, safflower, olive, soybean, and sunflower oils. Avocado. Seasoning and other foods Herbs. Spices. Seasoning mixes without salt. Unsalted popcorn and pretzels. Fat-free sweets. What foods are not recommended? The items listed may not be a complete list. Talk with your dietitian about what dietary choices are best for you. Grains Baked goods made with fat, such as croissants, muffins, or some breads. Dry pasta or rice meal packs. Vegetables Creamed or fried vegetables. Vegetables in a cheese sauce. Regular canned vegetables (not low-sodium or reduced-sodium). Regular canned tomato sauce and paste (not low-sodium or reduced-sodium). Regular tomato and vegetable juice (not low-sodium or reduced-sodium). Angie Fava. Olives. Fruits Canned fruit in a light or heavy syrup. Fried fruit. Fruit in cream or butter sauce. Meat and other protein foods Fatty cuts of meat. Ribs. Fried meat. Berniece Salines. Sausage. Bologna and other processed lunch meats. Salami. Fatback. Hotdogs. Bratwurst. Salted nuts and seeds. Canned beans with added salt. Canned or smoked fish. Whole eggs or egg yolks. Chicken or [place] with skin. Dairy Whole or 2% milk, cream, and half-and-half. Whole or full-fat cream cheese. Whole-fat or sweetened yogurt. Full-fat cheese.  Nondairy creamers. Whipped toppings. Processed cheese and cheese spreads. Fats and oils Butter. Stick margarine. Lard. Shortening. Ghee. Bacon fat. Tropical oils, such as coconut, palm kernel, or palm oil. Seasoning and other foods Salted popcorn and pretzels. Onion salt, garlic salt, seasoned salt, table salt, and sea salt. Worcestershire sauce. Tartar sauce. Barbecue sauce. Teriyaki sauce. Soy sauce, including reduced-sodium. Steak sauce. Canned and packaged gravies. Fish sauce. Oyster sauce. Cocktail sauce. Horseradish that you find on the shelf. Ketchup. Mustard. Meat flavorings and tenderizers. Bouillon cubes. Hot sauce and Tabasco sauce. Premade or packaged marinades. Premade or packaged taco seasonings. Relishes. Regular salad dressings. Where to find more information:  National Heart, Lung, and [place]: https://wilson-eaton.com/  American Heart Association: www.heart.org Summary  The DASH eating plan is a healthy eating plan that has been shown to reduce high blood pressure (hypertension). It may also reduce your risk for type 2 diabetes, heart disease, and stroke.  With the DASH eating plan, you should limit salt (sodium) intake to 2,300 mg a day. If you have hypertension, you may need to reduce your sodium intake to 1,500 mg a day.  When on the DASH eating plan, aim to eat more fresh fruits and vegetables, whole grains, lean proteins, low-fat dairy, and heart-healthy fats.  Work with your health care provider or diet and nutrition specialist (dietitian)  to adjust your eating plan to your individual calorie needs. This information is not intended to replace advice given to you by your health care provider. Make sure you discuss any questions you have with your health care provider. Document Revised: 05/26/2017 Document Reviewed: 06/06/2016 Elsevier Patient Education  2020 [employer].

## 2019-08-28 ENCOUNTER — Encounter: Payer: Self-pay | Admitting: Allergy & Immunology

## 2019-08-28 ENCOUNTER — Other Ambulatory Visit: Payer: Medicare Other

## 2019-08-28 ENCOUNTER — Ambulatory Visit (INDEPENDENT_AMBULATORY_CARE_PROVIDER_SITE_OTHER): Payer: Medicare Other | Admitting: Allergy & Immunology

## 2019-08-28 ENCOUNTER — Other Ambulatory Visit: Payer: Self-pay

## 2019-08-28 ENCOUNTER — Telehealth: Payer: Self-pay

## 2019-08-28 DIAGNOSIS — R030 Elevated blood-pressure reading, without diagnosis of hypertension: Secondary | ICD-10-CM

## 2019-08-28 DIAGNOSIS — J4541 Moderate persistent asthma with (acute) exacerbation: Secondary | ICD-10-CM

## 2019-08-28 DIAGNOSIS — J302 Other seasonal allergic rhinitis: Secondary | ICD-10-CM

## 2019-08-28 DIAGNOSIS — J3089 Other allergic rhinitis: Secondary | ICD-10-CM

## 2019-08-28 DIAGNOSIS — Z130 Encounter for screening for diseases of the blood and blood-forming organs and certain disorders involving the immune mechanism: Secondary | ICD-10-CM

## 2019-08-28 DIAGNOSIS — E663 Overweight: Secondary | ICD-10-CM

## 2019-08-28 DIAGNOSIS — Z8249 Family history of ischemic heart disease and other diseases of the circulatory system: Secondary | ICD-10-CM

## 2019-08-28 LAB — BAYER DCA HB A1C WAIVED: HB A1C (BAYER DCA - WAIVED): 5.7 % (ref <7.0)

## 2019-08-28 MED ORDER — IPRATROPIUM-ALBUTEROL 0.5-2.5 (3) MG/3ML IN SOLN: 3.0000 mL | RESPIRATORY_TRACT | 0 refills | Status: DC | PRN

## 2019-08-28 MED ORDER — PREDNISONE 10 MG PO TABS: ORAL_TABLET | ORAL | 0 refills | Status: DC

## 2019-08-28 NOTE — Progress Notes (Signed)
RE: ISMERAI BIN MRN: [id number] DOB: 1961-02-03 Date of Telemedicine Visit: 08/28/2019  Referring provider: Raliegh Ip, DO Primary care provider: Raliegh Ip, DO  Chief Complaint: No chief complaint on file.   Telemedicine Follow Up Visit via Telephone: I connected with [name] for a follow up on 08/28/19 by telephone and verified that I am speaking with the correct person using two identifiers.   I discussed the limitations, risks, security and privacy concerns of performing an evaluation and management service by telephone and the availability of in person appointments. I also discussed with the patient that there may be a patient responsible charge related to this service. The patient expressed understanding and agreed to proceed.  Patient is at home.  Provider is at the office.  Visit start time: 4:15 PM Visit end time: 4:29 PM Insurance consent/check in by: [facility] consent and medical assistant/nurse: Dr. Dellis Anes  History of Present Illness:  She is a 59 y.o. female, who is being followed for moderate persistent asthma as well as allergic rhinitis. Her previous allergy office visit was in January 2021 with myself. At that time, lung testing looked excellent. We continued her on Symbicort two puffs BID in conjunction with albuterol as needed. She does have Asmanex to use as needed during flares. For her rhinitis, we continued with the Flonase as well as the Astelin and the cetirizine.   [name] presents for a sick visit. Since the last visit, she reports that she has deep congestion that is "making [her] short winded". She has been using her rescue inhaler and she has been taking these breathing treatments for fours. It does break it up but it is deep. She has not had a fever at all. She has not had chest pain. She has not been using Mucinex but instead it just "dries everything up". She has not been using Delsum because she does not want to stop the  coughing. I can hear her audibly wheezing in the background. She has bene on her Symbicort.   She denies sinus pain and postnasal drip. She did get behind on her spray. She is still catching up on it since getting the nasal spray in the mail.   Her mother had COVID19. This was around the end of December 2020 and into January 2021. She has not gotten her shot yet. Her mother did recover, but she is having some ongoing fatigue. Otherwise, she came through fairly well with that.   She is going to see Cardiology for an evaluation of hypertension. She has a brother and a sister in her family and both of them in the last 4-5 months have started having "major heart problems". Therefore she requested to go see a Cardiologist. Her brother had four blockages in her heart. Her sister was "feeling bad" and they did a lot of tests that showed a history of previous heart attacks. This prompted [name] to go to see a Cardiologist. [name] is not on a blood pressure medication at all.   Otherwise, there have been no changes to her past medical history, surgical history, family history, or social history.  Assessment and Plan:  [name] is a 59 y.o. female with:  Moderate persistent asthma, uncomplicated - with acute exacerbation (4th round of prednisone in 12 months)   Seasonal and perennial allergic rhinitis(indoor and outdoor molds, cat, dog, cockroach)  Chronic back pain - with a history of scoliosis (rods placement in 2010) and now on disability   We  are going to treat her inflammation with a round of prednisone. I do not think that we need an antibiotics at this time, but we will keep that in our back packets in case she does not improve. We are going to continue with her faily medications, but I did have another discussion with her regarding intiation of Fasenra. She is open to that again. We discussed the side effects of systemic steroids.    Diagnostics: None.  Medication List:  Current Outpatient  Medications  Medication Sig Dispense Refill  . albuterol (VENTOLIN HFA) 108 (90 Base) MCG/ACT inhaler INHALE TWO PUFFS INTO LUNGS EVERY 6 HOURS AS NEEDED FOR WHEEZING OR SHORTNESS OF BREATH 18 g 6  . ARNUITY ELLIPTA 100 MCG/ACT AEPB Inhale 1 puff into the lungs daily. For 1-2 weeks during signs and symptoms of upper respiratory infection. 30 each 2  . azelastine (ASTELIN) 0.1 % nasal spray Place 2 sprays into both nostrils 2 (two) times daily as needed for rhinitis. Use in each nostril as directed 30 mL 6  . budesonide-formoterol (SYMBICORT) 160-4.5 MCG/ACT inhaler Inhale 2 puffs into the lungs 2 (two) times a day. 10.2 g 5  . diclofenac (VOLTAREN) 75 MG EC tablet Take 75 mg by mouth 2 (two) times daily with a meal.  2  . fluticasone (FLONASE) 50 MCG/ACT nasal spray Place 2 sprays into both nostrils 2 (two) times daily. 16 g 3  . levocetirizine (XYZAL) 5 MG tablet Take 1 tablet (5 mg total) by mouth every evening. 90 tablet 1  . montelukast (SINGULAIR) 10 MG tablet Take 1 tablet (10 mg total) by mouth at bedtime. 30 tablet 5  . morphine (MSIR) 15 MG tablet Take 15 mg by mouth every 8 (eight) hours as needed. Back Pain      . pantoprazole (PROTONIX) 40 MG tablet Take 1 tablet (40 mg total) by mouth daily. 90 tablet 1  . tiZANidine (ZANAFLEX) 4 MG tablet Take 4 mg by mouth 2 (two) times daily as needed.     No current facility-administered medications for this visit.   Allergies: No Known Allergies I reviewed her past medical history, social history, family history, and environmental history and no significant changes have been reported from previous visits.  Review of Systems  Constitutional: Negative for activity change, appetite change, chills and diaphoresis.  HENT: Negative for congestion, postnasal drip, rhinorrhea, sinus pressure and sore throat.   Eyes: Negative for pain, discharge, redness and itching.  Respiratory: Positive for chest tightness and shortness of breath. Negative for  wheezing and stridor.   Gastrointestinal: Negative for diarrhea, nausea and vomiting.  Endocrine: Negative for cold intolerance and heat intolerance.  Musculoskeletal: Negative for arthralgias, joint swelling and myalgias.  Skin: Negative for rash.  Allergic/Immunologic: Negative for environmental allergies and food allergies.    Objective:  Physical exam not obtained as encounter was done via telephone.   Previous notes and tests were reviewed.  I discussed the assessment and treatment plan with the patient. The patient was provided an opportunity to ask questions and all were answered. The patient agreed with the plan and demonstrated an understanding of the instructions.   The patient was advised to call back or seek an in-person evaluation if the symptoms worsen or if the condition fails to improve as anticipated.  I provided 14 minutes of non-face-to-face time during this encounter.  It was my pleasure to participate in [place] care today. Please feel free to contact me with any questions or concerns.  Sincerely,  Valentina Shaggy, MD

## 2019-08-28 NOTE — Telephone Encounter (Signed)
Error

## 2019-08-29 LAB — CBC
Hematocrit: 43.7 % (ref 34.0–46.6)
Hemoglobin: 14.9 g/dL (ref 11.1–15.9)
MCH: 30.8 pg (ref 26.6–33.0)
MCHC: 34.1 g/dL (ref 31.5–35.7)
MCV: 91 fL (ref 79–97)
Platelets: 240 10*3/uL (ref 150–450)
RBC: 4.83 x10E6/uL (ref 3.77–5.28)
RDW: 12.8 % (ref 11.7–15.4)
WBC: 6.3 10*3/uL (ref 3.4–10.8)

## 2019-08-29 LAB — CMP14+EGFR
ALT: 22 IU/L (ref 0–32)
AST: 19 IU/L (ref 0–40)
Albumin/Globulin Ratio: 1.7 (ref 1.2–2.2)
Albumin: 4.3 g/dL (ref 3.8–4.9)
Alkaline Phosphatase: 135 IU/L — ABNORMAL HIGH (ref 39–117)
BUN/Creatinine Ratio: 15 (ref 9–23)
BUN: 12 mg/dL (ref 6–24)
Bilirubin Total: 0.4 mg/dL (ref 0.0–1.2)
CO2: 25 mmol/L (ref 20–29)
Calcium: 9.7 mg/dL (ref 8.7–10.2)
Chloride: 104 mmol/L (ref 96–106)
Creatinine, Ser: 0.8 mg/dL (ref 0.57–1.00)
GFR calc Af Amer: 95 mL/min/{1.73_m2} (ref >59)
GFR calc non Af Amer: 83 mL/min/{1.73_m2} (ref >59)
Globulin, Total: 2.5 g/dL (ref 1.5–4.5)
Glucose: 94 mg/dL (ref 65–99)
Potassium: 4.2 mmol/L (ref 3.5–5.2)
Sodium: 143 mmol/L (ref 134–144)
Total Protein: 6.8 g/dL (ref 6.0–8.5)

## 2019-08-29 LAB — LIPID PANEL
Chol/HDL Ratio: 3.6 ratio (ref 0.0–4.4)
Cholesterol, Total: 169 mg/dL (ref 100–199)
HDL: 47 mg/dL (ref >39)
LDL Chol Calc (NIH): 98 mg/dL (ref 0–99)
Triglycerides: 135 mg/dL (ref 0–149)
VLDL Cholesterol Cal: 24 mg/dL (ref 5–40)

## 2019-09-06 ENCOUNTER — Telehealth: Payer: Self-pay | Admitting: *Deleted

## 2019-09-06 NOTE — Telephone Encounter (Signed)
Lab results have been reviewed with patient.She will come one day next week for repeat blood work.  Please order appropriate labs.   She has had diarrhea for the last three nights,(after going to bed). She also vomited a few times.  Imodium did not help.  Strangely , no diarrhea during the day. Nurse advised her to stay hydrated and information would be passed on to provider.

## 2019-09-08 ENCOUNTER — Other Ambulatory Visit: Payer: Self-pay | Admitting: Family Medicine

## 2019-09-08 DIAGNOSIS — R748 Abnormal levels of other serum enzymes: Secondary | ICD-10-CM

## 2019-09-08 NOTE — Telephone Encounter (Signed)
Please call patient to arrange.  Perhaps can be done when she comes in for repeat labs

## 2019-09-09 ENCOUNTER — Telehealth: Payer: Self-pay | Admitting: *Deleted

## 2019-09-09 NOTE — Telephone Encounter (Signed)
L/M for patient 3/8 in regards to her interest in starting [facility] will mail application [place]- App mailed 3/15

## 2019-09-12 ENCOUNTER — Encounter: Payer: Self-pay | Admitting: *Deleted

## 2019-09-12 ENCOUNTER — Other Ambulatory Visit: Payer: Medicare Other

## 2019-09-12 ENCOUNTER — Ambulatory Visit (INDEPENDENT_AMBULATORY_CARE_PROVIDER_SITE_OTHER): Payer: Medicare Other | Admitting: Cardiology

## 2019-09-12 ENCOUNTER — Telehealth: Payer: Self-pay | Admitting: Cardiology

## 2019-09-12 ENCOUNTER — Other Ambulatory Visit: Payer: Self-pay

## 2019-09-12 ENCOUNTER — Encounter: Payer: Self-pay | Admitting: Cardiology

## 2019-09-12 VITALS — BP 112/68 | HR 57 | Ht 67.0 in | Wt 184.0 lb

## 2019-09-12 DIAGNOSIS — R9431 Abnormal electrocardiogram [ECG] [EKG]: Secondary | ICD-10-CM | POA: Diagnosis not present

## 2019-09-12 DIAGNOSIS — R748 Abnormal levels of other serum enzymes: Secondary | ICD-10-CM

## 2019-09-12 DIAGNOSIS — Z8249 Family history of ischemic heart disease and other diseases of the circulatory system: Secondary | ICD-10-CM | POA: Diagnosis not present

## 2019-09-12 DIAGNOSIS — Z136 Encounter for screening for cardiovascular disorders: Secondary | ICD-10-CM | POA: Diagnosis not present

## 2019-09-12 LAB — CMP14+EGFR
ALT: 25 IU/L (ref 0–32)
AST: 18 IU/L (ref 0–40)
Albumin/Globulin Ratio: 1.7 (ref 1.2–2.2)
Albumin: 3.8 g/dL (ref 3.8–4.9)
Alkaline Phosphatase: 105 IU/L (ref 39–117)
BUN/Creatinine Ratio: 22 (ref 9–23)
BUN: 15 mg/dL (ref 6–24)
Bilirubin Total: 0.2 mg/dL (ref 0.0–1.2)
CO2: 27 mmol/L (ref 20–29)
Calcium: 8.7 mg/dL (ref 8.7–10.2)
Chloride: 102 mmol/L (ref 96–106)
Creatinine, Ser: 0.67 mg/dL (ref 0.57–1.00)
GFR calc Af Amer: 114 mL/min/{1.73_m2} (ref >59)
GFR calc non Af Amer: 99 mL/min/{1.73_m2} (ref >59)
Globulin, Total: 2.2 g/dL (ref 1.5–4.5)
Glucose: 82 mg/dL (ref 65–99)
Potassium: 4 mmol/L (ref 3.5–5.2)
Sodium: 142 mmol/L (ref 134–144)
Total Protein: 6 g/dL (ref 6.0–8.5)

## 2019-09-12 NOTE — Patient Instructions (Addendum)
Medication Instructions:    Your physician recommends that you continue on your current medications as directed. Please refer to the Current Medication list given to you today.  Labwork:  Your physician recommends that you return for a covid test 2-3 days before your stress test. You will need to go home and quarantine until your stress test is completed. You will be given this date and time once your stress test has been scheduled.  Testing/Procedures: Your physician has requested that you have an exercise tolerance test. For further information please visit [email or web address] Please also follow instruction sheet, as given.  Follow-Up:  Your physician recommends that you schedule a follow-up appointment in: pending.   Any Other Special Instructions Will Be Listed Below (If Applicable).  If you need a refill on your cardiac medications before your next appointment, please call your pharmacy.

## 2019-09-12 NOTE — Progress Notes (Signed)
Cardiology Office Note  Date: 09/12/2019   ID: [name], [name] 05-02-1961, MRN [id number]  PCP:  Raliegh Ip, DO  Cardiologist:  Nona Dell, MD Electrophysiologist:  None   Chief Complaint  Patient presents with  . Cardiac evaluation    History of Present Illness: [name] is a 59 y.o. female referred for cardiology consultation by Dr. Nadine Counts for cardiac evaluation in light of family history of early heart disease.  She does not report any exertional symptoms at this time.  No personal history of hypertension requiring medications although apparently her blood pressure has fluctuated from time to time.  Also no personal history of type 2 diabetes mellitus or significant hyperlipidemia.  Estimated 10-year risk of CVD is actually fairly low at 1.6% based current parameters, although this does not take into account family history of early CAD.  She is not clearly a candidate for statin therapy however.  She states that she has a brother aged 70 with CAD and a sister aged 12 with CAD.  She does mention that their lifestyles are different from hers.  Also maternal grandfather who died in his 6s with an MI and a paternal aunt with some type of heart problem who also died in her 78s.  I personally reviewed her ECG today which shows sinus bradycardia with decreased anteroseptal R waves.  There is no old tracing for comparison.  She works the [employer] at [employer], 4 or 5 mornings a week.  Also goes to the gym twice a week, uses a treadmill or a NuStep machine 30 minutes at a time.  She has not undergone any previous screening ischemic evaluations.   Past Medical History:  Diagnosis Date  . Arthritis    Spine, R knee  . Asthma   . Back pain   . Cough 09/03/2014   Followed in Pulmonary clinic/ [facility]/ [place] - sinus CT 09/08/2014> Mild mucosal thickening involves the paranasal sinuses  - Eos 1.2  08/06/14 - Allergy profile 11/27/14 >  Eos 1.0/ IgE   41 with neg RAST    . GERD (gastroesophageal reflux disease)   . History of blood transfusion    Back surgery    Past Surgical History:  Procedure Laterality Date  . ABDOMINAL HYSTERECTOMY     "cysts", pelvic pain  . BACK SURGERY    . KNEE ARTHROSCOPY Right 2017  . SPINE SURGERY  2010   fusion  . SPINE SURGERY  2014   remove hardware    Current Outpatient Medications  Medication Sig Dispense Refill  . albuterol (VENTOLIN HFA) 108 (90 Base) MCG/ACT inhaler INHALE TWO PUFFS INTO LUNGS EVERY 6 HOURS AS NEEDED FOR WHEEZING OR SHORTNESS OF BREATH 18 g 6  . ARNUITY ELLIPTA 100 MCG/ACT AEPB Inhale 1 puff into the lungs daily. For 1-2 weeks during signs and symptoms of upper respiratory infection. 30 each 2  . budesonide-formoterol (SYMBICORT) 160-4.5 MCG/ACT inhaler Inhale 2 puffs into the lungs 2 (two) times a day. 10.2 g 5  . diclofenac (VOLTAREN) 75 MG EC tablet Take 75 mg by mouth 2 (two) times daily with a meal.  2  . fluticasone (FLONASE) 50 MCG/ACT nasal spray Place 2 sprays into both nostrils 2 (two) times daily. 16 g 3  . ipratropium-albuterol (DUONEB) 0.5-2.5 (3) MG/3ML SOLN Take 3 mLs by nebulization every 4 (four) hours as needed. 360 mL 0  . levocetirizine (XYZAL) 5 MG tablet Take 1 tablet (5 mg total) by  mouth every evening. 90 tablet 1  . montelukast (SINGULAIR) 10 MG tablet Take 1 tablet (10 mg total) by mouth at bedtime. 30 tablet 5  . morphine (MSIR) 15 MG tablet Take 15 mg by mouth every 8 (eight) hours as needed. Back Pain      . pantoprazole (PROTONIX) 40 MG tablet Take 1 tablet (40 mg total) by mouth daily. 90 tablet 1  . tiZANidine (ZANAFLEX) 4 MG tablet Take 4 mg by mouth 2 (two) times daily as needed.    Marland Kitchen azelastine (ASTELIN) 0.1 % nasal spray Place 2 sprays into both nostrils 2 (two) times daily as needed for rhinitis. Use in each nostril as directed 30 mL 6   No current facility-administered medications for this visit.   Allergies:  Patient has no known  allergies.   Social History: The patient  reports that she has never smoked. She has never used smokeless tobacco. She reports that she does not drink alcohol or use drugs.   Family History: The patient's family history includes Arthritis in her mother; Asthma in her brother and sister; COPD in her maternal grandmother and maternal uncle; Diabetes in her mother; Early death (age of onset: 54) in her father; Emphysema in her maternal grandmother and maternal uncle; Heart disease in her maternal grandfather; Hyperlipidemia in her mother; Hypertension in her mother.   ROS:   Chronic back pain, previous surgery.  Physical Exam: VS:  BP 112/68   Pulse (!) 57   Ht 5\' 7"  (1.702 m)   Wt 184 lb (83.5 kg)   LMP 08/30/2002 (Approximate)   SpO2 97%   BMI 28.82 kg/m , BMI Body mass index is 28.82 kg/m.  Wt Readings from Last 3 Encounters:  09/12/19 184 lb (83.5 kg)  08/21/19 183 lb (83 kg)  03/13/19 176 lb 6.4 oz (80 kg)    General: Patient appears comfortable at rest. HEENT: Conjunctiva and lids normal, wearing a mask. Neck: Supple, no elevated JVP or carotid bruits, no thyromegaly. Lungs: Clear to auscultation, nonlabored breathing at rest. Cardiac: Regular rate and rhythm, no S3 or significant systolic murmur, no pericardial rub. Abdomen: Soft, nontender, bowel sounds present. Extremities: No pitting edema, distal pulses 2+. Skin: Warm and dry. Musculoskeletal: No kyphosis. Neuropsychiatric: Alert and oriented x3, affect grossly appropriate.  ECG:  There are no old tracings for comparison.  Recent Labwork: 08/28/2019: ALT 22; AST 19; BUN 12; Creatinine, Ser 0.80; Hemoglobin 14.9; Platelets 240; Potassium 4.2; Sodium 143     Component Value Date/Time   CHOL 169 08/28/2019 0923   TRIG 135 08/28/2019 0923   HDL 47 08/28/2019 0923   CHOLHDL 3.6 08/28/2019 0923   CHOLHDL 4.2 12/14/2017 0905   VLDL 65 (H) 11/29/2016 1112   LDLCALC 98 08/28/2019 0923   LDLCALC 98 12/14/2017 0905     Other Studies Reviewed Today:  No prior cardiac testing for review.  Assessment and Plan:  Cardiac evaluation in a 59 year old woman with family history of early heart disease in 2 siblings.  She reports blood pressure fluctuations, but no history of longer standing hypertension to require medical therapy.  Based on recent parameters her actual 10-year cardiovascular risk calculation is low at 1.6%, but this does not take into account her family history.  ECG is nonspecific with decreased anteroseptal R waves, no old tracing for comparison.  She has not undergone any previous objective ischemic evaluations and we will schedule a screening GXT.  Medication Adjustments/Labs and Tests Ordered: Current medicines are reviewed at  length with the patient today.  Concerns regarding medicines are outlined above.   Tests Ordered: Orders Placed This Encounter  Procedures  . EXERCISE TOLERANCE TEST (ETT)  . EKG 12-Lead    Medication Changes: No orders of the defined types were placed in this encounter.   Disposition:  Follow up test results.  Signed, Satira Sark, MD, [facility] 09/12/2019 9:34 AM    [facility] [place], [place], [place] [address] Phone: [phone]; Fax: [phone]

## 2019-09-12 NOTE — Telephone Encounter (Signed)
Pre-cert Verification for the following procedure    GXT   DATE:    09/25/2019  LOCATION: [place]

## 2019-09-13 ENCOUNTER — Telehealth: Payer: Self-pay | Admitting: Family Medicine

## 2019-09-13 NOTE — Telephone Encounter (Signed)
PAtient aware of labs

## 2019-09-13 NOTE — Telephone Encounter (Signed)
Called LMTCB. 

## 2019-09-14 ENCOUNTER — Other Ambulatory Visit: Payer: Self-pay | Admitting: Allergy & Immunology

## 2019-09-14 DIAGNOSIS — J302 Other seasonal allergic rhinitis: Secondary | ICD-10-CM

## 2019-09-15 ENCOUNTER — Other Ambulatory Visit: Payer: Self-pay | Admitting: Allergy & Immunology

## 2019-09-16 ENCOUNTER — Other Ambulatory Visit: Payer: Self-pay

## 2019-09-16 DIAGNOSIS — J3089 Other allergic rhinitis: Secondary | ICD-10-CM

## 2019-09-16 DIAGNOSIS — J302 Other seasonal allergic rhinitis: Secondary | ICD-10-CM

## 2019-09-16 MED ORDER — LEVOCETIRIZINE DIHYDROCHLORIDE 5 MG PO TABS: 5.0000 mg | ORAL_TABLET | Freq: Every morning | ORAL | 0 refills | Status: DC

## 2019-09-16 NOTE — Telephone Encounter (Signed)
I did a courtesy refill earlier today for the pt., but I thought she was seen 08/2018, but she was seen earlier part of this month of this year with Dr. Dellis Anes. Pt. Was given 90 day supply. Pt. Aware.

## 2019-09-18 ENCOUNTER — Ambulatory Visit: Payer: Medicare Other

## 2019-09-19 ENCOUNTER — Other Ambulatory Visit: Payer: Self-pay | Admitting: Allergy & Immunology

## 2019-09-19 NOTE — Telephone Encounter (Signed)
Called LMTCB. 

## 2019-09-20 ENCOUNTER — Telehealth: Payer: Self-pay | Admitting: *Deleted

## 2019-09-20 NOTE — Telephone Encounter (Signed)
PA has been submitted through CoverMyMeds for Fluticasone and is currently pending approval or denial.  °

## 2019-09-23 ENCOUNTER — Other Ambulatory Visit (HOSPITAL_COMMUNITY)
Admission: RE | Admit: 2019-09-23 | Discharge: 2019-09-23 | Disposition: A | Discharge status: Home / Self Care | Payer: Medicare Other | Source: Ambulatory Visit | Attending: Cardiology | Admitting: Cardiology

## 2019-09-23 ENCOUNTER — Other Ambulatory Visit: Payer: Self-pay

## 2019-09-23 DIAGNOSIS — Z20822 Contact with and (suspected) exposure to covid-19: Secondary | ICD-10-CM | POA: Diagnosis not present

## 2019-09-23 DIAGNOSIS — Z01812 Encounter for preprocedural laboratory examination: Secondary | ICD-10-CM | POA: Diagnosis present

## 2019-09-23 LAB — SARS CORONAVIRUS 2 (TAT 6-24 HRS): SARS Coronavirus 2: NEGATIVE

## 2019-09-23 NOTE — Telephone Encounter (Signed)
PA has been denied for Flonase. Currently looking further into this to see if an appeal can be made.

## 2019-09-25 ENCOUNTER — Other Ambulatory Visit: Payer: Self-pay

## 2019-09-25 ENCOUNTER — Ambulatory Visit (HOSPITAL_COMMUNITY)
Admission: RE | Admit: 2019-09-25 | Discharge: 2019-09-25 | Disposition: A | Discharge status: Home / Self Care | Payer: Medicare Other | Source: Ambulatory Visit | Attending: Cardiology | Admitting: Cardiology

## 2019-09-25 ENCOUNTER — Telehealth: Payer: Self-pay | Admitting: *Deleted

## 2019-09-25 DIAGNOSIS — R9431 Abnormal electrocardiogram [ECG] [EKG]: Secondary | ICD-10-CM

## 2019-09-25 DIAGNOSIS — Z8249 Family history of ischemic heart disease and other diseases of the circulatory system: Secondary | ICD-10-CM | POA: Insufficient documentation

## 2019-09-25 LAB — EXERCISE TOLERANCE TEST
Estimated workload: 10.1 METS
Exercise duration (min): 7 min
Exercise duration (sec): 10 s
MPHR: 164 {beats}/min
Peak HR: 148 {beats}/min
Percent HR: 90 %
RPE: 13
Rest HR: 56 {beats}/min

## 2019-09-25 NOTE — Telephone Encounter (Signed)
-----   Message from Jonelle Sidle, MD sent at 09/25/2019  3:01 PM EDT ----- Results reviewed.  Please let her know that the treadmill test was low risk, reassuring in terms of her family history.  Would continue to focus on risk factor modification strategies with her PCP.

## 2019-09-26 NOTE — Telephone Encounter (Signed)
Patient informed. Copy sent to PCP °

## 2019-10-04 ENCOUNTER — Ambulatory Visit (INDEPENDENT_AMBULATORY_CARE_PROVIDER_SITE_OTHER): Payer: Medicare Other

## 2019-10-04 DIAGNOSIS — Z0000 Encounter for general adult medical examination without abnormal findings: Secondary | ICD-10-CM

## 2019-10-04 NOTE — Progress Notes (Signed)
MEDICARE ANNUAL WELLNESS VISIT  10/04/2019  Telephone Visit Disclaimer This Medicare AWV was conducted by telephone due to national recommendations for restrictions regarding the COVID-19 Pandemic (e.g. social distancing).  I verified, using two identifiers, that I am speaking with [name] or their authorized healthcare agent. I discussed the limitations, risks, security, and privacy concerns of performing an evaluation and management service by telephone and the potential availability of an in-person appointment in the future. The patient expressed understanding and agreed to proceed.   Subjective:  [name] is a 59 y.o. female patient of Janora Norlander, DO who had a Medicare Annual Wellness Visit today via telephone. [name] is Disabled and lives with her boyfriend of 20+ years and her grandson. she has two children. she reports that she is socially active and does interact with friends/family regularly. she is minimally physically active and enjoys gardening.  Patient Care Team: Janora Norlander, DO as PCP - General (Family Medicine) Satira Sark, MD as PCP - Cardiology (Cardiology)  Advanced Directives 03/13/2017 10/15/2014 08/06/2014  Does Patient Have a Medical Advance Directive? No No No  Would patient like information on creating a medical advance directive? No - Patient declined - No - patient declined information    Hospital Utilization Over the Past 12 Months: # of hospitalizations or ER visits: 0 # of surgeries: 0  Review of Systems    Patient reports that her overall health is better compared to last year.  Pain Assessment   C/O back pain today at a level 6. It is a dull pain that is constant.    Current Medications & Allergies (verified) Allergies as of 10/04/2019   No Known Allergies     Medication List       Accurate as of October 04, 2019 11:12 AM. If you have any questions, ask your nurse or doctor.        albuterol 108 (90 Base) MCG/ACT  inhaler Commonly known as: Ventolin HFA INHALE TWO PUFFS INTO LUNGS EVERY 6 HOURS AS NEEDED FOR WHEEZING OR SHORTNESS OF BREATH   Arnuity Ellipta 100 MCG/ACT Aepb Generic drug: Fluticasone Furoate Inhale 1 puff into the lungs daily. For 1-2 weeks during signs and symptoms of upper respiratory infection.   azelastine 0.1 % nasal spray Commonly known as: ASTELIN INSTILL 2 SPRAYS IN EACH NOSTRIL TWICE DAILY AS NEEDED   diclofenac 75 MG EC tablet Commonly known as: VOLTAREN Take 75 mg by mouth 2 (two) times daily with a meal.   fluticasone 50 MCG/ACT nasal spray Commonly known as: FLONASE PLACE TWO SPRAYS INTO EACH NOSTRIL TWICE DAILY   ipratropium-albuterol 0.5-2.5 (3) MG/3ML Soln Commonly known as: DUONEB Take 3 mLs by nebulization every 4 (four) hours as needed.   levocetirizine 5 MG tablet Commonly known as: XYZAL Take 1 tablet (5 mg total) by mouth every morning.   montelukast 10 MG tablet Commonly known as: SINGULAIR TAKE 1 TABLET BY MOUTH AT BEDTIME   morphine 15 MG tablet Commonly known as: MSIR Take 15 mg by mouth every 8 (eight) hours as needed. Back Pain   pantoprazole 40 MG tablet Commonly known as: PROTONIX Take 1 tablet (40 mg total) by mouth daily.   Symbicort 160-4.5 MCG/ACT inhaler Generic drug: budesonide-formoterol INHALE TWO PUFFS INTO THE LUNGS TWICE DAILY   tiZANidine 4 MG tablet Commonly known as: ZANAFLEX Take 4 mg by mouth 2 (two) times daily as needed.       History (reviewed): Past  Medical History:  Diagnosis Date  . Arthritis    Spine, R knee  . Asthma   . Back pain   . Cough 09/03/2014   Followed in Pulmonary clinic/ [facility]/ [place] - sinus CT 09/08/2014> Mild mucosal thickening involves the paranasal sinuses  - Eos 1.2  08/06/14 - Allergy profile 11/27/14 >  Eos 1.0/ IgE  41 with neg RAST    . GERD (gastroesophageal reflux disease)   . History of blood transfusion    Back surgery   Past Surgical History:  Procedure  Laterality Date  . ABDOMINAL HYSTERECTOMY     "cysts", pelvic pain  . BACK SURGERY    . KNEE ARTHROSCOPY Right 2017  . SPINE SURGERY  2010   fusion  . SPINE SURGERY  2014   remove hardware   Family History  Problem Relation Age of Onset  . Emphysema Maternal Grandmother        never smoker  . COPD Maternal Grandmother   . Emphysema Maternal Uncle        smoked  . COPD Maternal Uncle   . Arthritis Mother   . Diabetes Mother   . Hyperlipidemia Mother   . Hypertension Mother   . Early death Father 23       MVA  . Asthma Sister   . Asthma Brother   . Heart disease Maternal Grandfather    Social History   Socioeconomic History  . Marital status: Significant Other    Spouse name: Clide Cliff  . Number of children: 2  . Years of education: 85  . Highest education level: Not on file  Occupational History  . Occupation: disabled    Comment: back  Tobacco Use  . Smoking status: Never Smoker  . Smokeless tobacco: Never Used  Substance and Sexual Activity  . Alcohol use: No  . Drug use: No  . Sexual activity: Yes    Birth control/protection: Surgical  Other Topics Concern  . Not on file  Social History Narrative   Lives with Clide Cliff - 27 years   Clide Cliff has end stage colon cancer   Two sons/ grandchildren   Disabled from back pain/had scoliosis. Has had back reconstruction.    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    Social Determinants of Health   Financial Resource Strain:   . Difficulty of Paying Living Expenses:   Food Insecurity:   . Worried About Programme researcher, broadcasting/film/video in the Last Year:   . Barista in the Last Year:   Transportation Needs:   . Freight forwarder (Medical):   Marland Kitchen Lack of Transportation (Non-Medical):   Physical Activity:   . Days of Exercise per Week:   . Minutes of Exercise per Session:   Stress:   . Feeling of Stress :   Social Connections:   . Frequency of Communication with Friends and Family:   . Frequency of Social Gatherings with Friends and  Family:   . Attends Religious Services:   . Active Member of Clubs or Organizations:   . Attends Banker Meetings:   Marland Kitchen Marital Status:     Activities of Daily Living No flowsheet data found.  Patient Education/ Literacy    Exercise    Diet Patient reports consuming 3 meals a day and 2 snack(s) a day Patient reports that her primary diet is: Regular. She does avoid sodas, greasy and watches her salt intake. Patient reports that she does have regular access to food.   Depression Screen  PHQ 2/9 Scores 08/21/2019 04/16/2018 01/31/2018 11/29/2017 05/31/2017 11/29/2016  PHQ - 2 Score 0 0 0 0 0 0  PHQ- 9 Score 0 - - - - -     Fall Risk Fall Risk  04/16/2018 01/31/2018 11/29/2017 08/10/2017 05/31/2017  Falls in the past year? No No Yes Yes No  Number falls in past yr: - - 1 1 -  Injury with Fall? - - Yes Yes -     Objective:  [name] seemed alert and oriented and she participated appropriately during our telephone visit.  Blood Pressure Weight BMI  BP Readings from Last 3 Encounters:  09/12/19 112/68  08/21/19 138/79  07/24/19 128/74   Wt Readings from Last 3 Encounters:  09/12/19 184 lb (83.5 kg)  08/21/19 183 lb (83 kg)  03/13/19 176 lb 6.4 oz (80 kg)   BMI Readings from Last 1 Encounters:  09/12/19 28.82 kg/m    *Unable to obtain current vital signs, weight, and BMI due to telephone visit type  Hearing/Vision  . [name] did not seem to have difficulty with hearing/understanding during the telephone conversation . Reports that she has not had a formal eye exam by an eye care professional within the past year . Reports that she has not had a formal hearing evaluation within the past year *Unable to fully assess hearing and vision during telephone visit type  Cognitive Function: No flowsheet data found. (Normal:0-7, Significant for Dysfunction: >8)  Normal Cognitive Function Screening: Yes   Immunization & Health Maintenance Record Immunization History    Administered Date(s) Administered  . Influenza, Seasonal, Injecte, Preservative Fre 05/28/2013  . Influenza,inj,Quad PF,6+ Mos 06/07/2017, 04/16/2018  . Pneumococcal Conjugate-13 11/29/2017  . Td 06/28/2011  . Tdap 06/29/2011    Health Maintenance  Topic Date Due  . HIV Screening  Never done  . MAMMOGRAM  12/15/2019  . Fecal DNA (Cologuard)  01/03/2020  . INFLUENZA VACCINE  01/26/2020  . TETANUS/TDAP  06/28/2021  . Hepatitis C Screening  Completed  . PAP SMEAR-Modifier  Discontinued       Assessment  This is a routine wellness examination for [name].  Health Maintenance: Due or Overdue Health Maintenance Due  Topic Date Due  . HIV Screening  Never done    [name] does not need a referral for Community Assistance: Care Management:   no Social Work:    no Prescription Assistance:  no Nutrition/Diabetes Education:  no   Plan:  Education administrator Health Maintenance & Screening Recommendations   Lung Cancer Screening Recommended: no (Low Dose CT Chest recommended if Age 72-80 years, 30 pack-year currently smoking OR have quit w/in past 15 years) Hepatitis C Screening recommended: no HIV Screening recommended: no  Advanced Directives: Written information was not prepared per patient's request.  Referrals & Orders No orders of the defined types were placed in this encounter.   Follow-up Plan . Follow-up with Raliegh Ip, DO as planned . Schedule 08/21/2020   I have personally reviewed and noted the following in the patient's chart:   . Medical and social history . Use of alcohol, tobacco or illicit drugs  . Current medications and supplements . Functional ability and status . Nutritional status . Physical activity . Advanced directives . List of other physicians . Hospitalizations, surgeries, and ER visits in previous 12 months . Vitals . Screenings to include cognitive, depression, and falls . Referrals  and appointments  In addition, I have reviewed  and discussed with [name] certain preventive protocols, quality metrics, and best practice recommendations. A written personalized care plan for preventive services as well as general preventive health recommendations is available and can be mailed to the patient at her request.      Suzan Slick [facility]  08/04/3149

## 2019-11-01 ENCOUNTER — Telehealth: Payer: Self-pay

## 2019-11-01 NOTE — Telephone Encounter (Signed)
Fax from [employer].   PA for Fluticasone has been approved:  08/03/19 until 06/26/2020. Fax to pharmacy to notify of approval.

## 2020-01-02 ENCOUNTER — Ambulatory Visit (INDEPENDENT_AMBULATORY_CARE_PROVIDER_SITE_OTHER): Payer: Medicare Other | Admitting: Nurse Practitioner

## 2020-01-02 ENCOUNTER — Other Ambulatory Visit: Payer: Self-pay

## 2020-01-02 ENCOUNTER — Encounter: Payer: Self-pay | Admitting: Nurse Practitioner

## 2020-01-02 VITALS — BP 130/76 | HR 55 | Temp 98.0°F | Ht 67.0 in | Wt 180.0 lb

## 2020-01-02 DIAGNOSIS — I83813 Varicose veins of bilateral lower extremities with pain: Secondary | ICD-10-CM

## 2020-01-02 MED ORDER — IBUPROFEN 600 MG PO TABS: 600.0000 mg | ORAL_TABLET | Freq: Three times a day (TID) | ORAL | 0 refills | Status: DC | PRN

## 2020-01-02 NOTE — Patient Instructions (Addendum)
Varicose veins of bilateral lower extremities with pain She is a 59 year old female who presents to clinic with varicose veins.  This is not new for patient, the last 2 weeks varicose veins have become inflamed, painful, itching, throbbing with skin discoloration.  Patient has never been evaluated for varicose veins.  Patient reports pain of 3 out of 6 on the pain scale of 0-10.  Patient reports a swollen nodule on the left calf.  She denies fever, muscle cramping, shortness of breath and heaviness.  On assessment a painful nodule is  palpated on left calf. Discoloration to upper front shin all the way to  thighs.  Patient has a family history of DVTs. Lateral lower extremity Dopplers ordered. Advise to wear compression socks.  Elevate feet. Referral to vein specialist completed. Provided education to patient with printed handouts given. Patient is to follow-up with unresolved or worsening symptoms.    Varicose Veins Varicose veins are veins that have become enlarged, bulged, and twisted. They most often appear in the legs. What are the causes? This condition is caused by damage to the valves in the vein. These valves help blood return to your heart. When they are damaged and they stop working properly, blood may flow backward and back up in the veins near the skin, causing the veins to get larger and appear twisted. The condition can result from any issue that causes blood to back up, like pregnancy, prolonged standing, or obesity. What increases the risk? This condition is more likely to develop in people who are:  On their feet a lot.  Pregnant.  Overweight. What are the signs or symptoms? Symptoms of this condition include:  Bulging, twisted, and bluish veins.  A feeling of heaviness. This may be worse at the end of the day.  Leg pain. This may be worse at the end of the day.  Swelling in the leg.  Changes in skin color over the veins. How is this diagnosed? This condition may  be diagnosed based on your symptoms, a physical exam, and an ultrasound test. How is this treated? Treatment for this condition may involve:  Avoiding sitting or standing in one position for long periods of time.  Wearing compression stockings. These stockings help to prevent blood clots and reduce swelling in the legs.  Raising (elevating) the legs when resting.  Losing weight.  Exercising regularly. If you have persistent symptoms or want to improve the way your varicose veins look, you may choose to have a procedure to close the varicose veins off or to remove them. Treatments to close off the veins include:  Sclerotherapy. In this treatment, a solution is injected into a vein to close it off.  Laser treatment. In this treatment, the vein is heated with a laser to close it off.  Radiofrequency vein ablation. In this treatment, an electrical current produced by radio waves is used to close off the vein. Treatments to remove the veins include:  Phlebectomy. In this treatment, the veins are removed through small incisions made over the veins.  Vein ligation and stripping. In this treatment, incisions are made over the veins. The veins are then removed after being tied (ligated) with stitches (sutures). Follow these instructions at home: Activity  Walk as much as possible. Walking increases blood flow. This helps blood return to the heart and takes pressure off your veins. It also increases your cardiovascular strength.  Follow your health care provider's instructions about exercising.  Do not stand or sit in one  position for a long period of time.  Do not sit with your legs crossed.  Rest with your legs raised during the day. General instructions   Follow any diet instructions given to you by your health care provider.  Wear compression stockings as directed by your health care provider. Do not wear other kinds of tight clothing around your legs, pelvis, or  waist.  Elevate your legs at night to above the level of your heart.  If you get a cut in the skin over the varicose vein and the vein bleeds: ? Lie down with your leg raised. ? Apply firm pressure to the cut with a clean cloth until the bleeding stops. ? Place a bandage (dressing) on the cut. Contact a health care provider if:  The skin around your varicose veins starts to break down.  You have pain, redness, tenderness, or hard swelling over a vein.  You are uncomfortable because of pain.  You get a cut in the skin over a varicose vein and it will not stop bleeding. Summary  Varicose veins are veins that have become enlarged, bulged, and twisted. They most often appear in the legs.  This condition is caused by damage to the valves in the vein. These valves help blood return to your heart.  Treatment for this condition includes frequent movements, wearing compression stockings, losing weight, and exercising regularly. In some cases, procedures are done to close off or remove the veins.  Treatment for this condition may include wearing compression stockings, elevating the legs, losing weight, and engaging in regular activity. In some cases, procedures are done to close off or remove the veins. This information is not intended to replace advice given to you by your health care provider. Make sure you discuss any questions you have with your health care provider. Document Revised: 08/09/2018 Document Reviewed: 07/06/2016 Elsevier Patient Education  2020 [employer].

## 2020-01-02 NOTE — Assessment & Plan Note (Signed)
She is a 59 year old female who presents to clinic with varicose veins.  This is not new for patient, the last 2 weeks varicose veins have become inflamed, painful, itching, throbbing with skin discoloration.  Patient has never been evaluated for varicose veins.  Patient reports pain of 3 out of 6 on the pain scale of 0-10.  Patient reports a swollen nodule on the left calf.  She denies fever, muscle cramping, shortness of breath and heaviness.  On assessment a painful nodule is  palpated on left calf. Discoloration to upper front shin all the way to  thighs.  Patient has a family history of DVTs. Lateral lower extremity Dopplers ordered. Advise to wear compression socks.  Elevate feet. Referral to vein specialist completed. Provided education to patient with printed handouts given. Patient is to follow-up with unresolved or worsening symptoms.

## 2020-01-02 NOTE — Progress Notes (Signed)
Acute Office Visit  Subjective:    Patient ID: DELAYNI STREED, female    DOB: [birth year], 59 y.o.   MRN: [id number]  Chief Complaint  Patient presents with  . Leg Problem    left lower leg swelling and bruise ( no injury )    HPI Patient is in today for varicose veins.  This is not new for patient, in the last 2 weeks varicose veins have become inflamed, painful, itching, throbbing, with skin discoloration.  Patient has never been evaluated in the past.  Patient reports pain of 3 out of 6 on the pain scale of 0-10.  Patient reports a swollen nodule on the left calf.  Patient denies fever muscle cramping, shortness of breath and heaviness.  Past Medical History:  Diagnosis Date  . Arthritis    Spine, R knee  . Asthma   . Back pain   . Cough 09/03/2014   Followed in Pulmonary clinic/ [facility]/ [place] - sinus CT 09/08/2014> Mild mucosal thickening involves the paranasal sinuses  - Eos 1.2  08/06/14 - Allergy profile 11/27/14 >  Eos 1.0/ IgE  41 with neg RAST    . GERD (gastroesophageal reflux disease)   . History of blood transfusion    Back surgery    Past Surgical History:  Procedure Laterality Date  . ABDOMINAL HYSTERECTOMY     "cysts", pelvic pain  . BACK SURGERY    . KNEE ARTHROSCOPY Right 2017  . SPINE SURGERY  2010   fusion  . SPINE SURGERY  2014   remove hardware    Family History  Problem Relation Age of Onset  . Emphysema Maternal Grandmother        never smoker  . COPD Maternal Grandmother   . Emphysema Maternal Uncle        smoked  . COPD Maternal Uncle   . Arthritis Mother   . Diabetes Mother   . Hyperlipidemia Mother   . Hypertension Mother   . Early death Father 15       MVA  . Asthma Sister   . Asthma Brother   . Heart disease Maternal Grandfather     Social History   Socioeconomic History  . Marital status: Significant Other    Spouse name: Clide Cliff  . Number of children: 2  . Years of education: 30  . Highest education level: Not on file    Occupational History  . Occupation: disabled    Comment: back  Tobacco Use  . Smoking status: Never Smoker  . Smokeless tobacco: Never Used  Vaping Use  . Vaping Use: Never used  Substance and Sexual Activity  . Alcohol use: No  . Drug use: No  . Sexual activity: Yes    Birth control/protection: Surgical  Other Topics Concern  . Not on file  Social History Narrative   Lives with Clide Cliff - 27 years   Clide Cliff has end stage colon cancer   Two sons/ grandchildren   Disabled from back pain/had scoliosis. Has had back reconstruction.    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    Social Determinants of Health   Financial Resource Strain:   . Difficulty of Paying Living Expenses:   Food Insecurity:   . Worried About Programme researcher, broadcasting/film/video in the Last Year:   . Barista in the Last Year:   Transportation Needs:   . Freight forwarder (Medical):   Marland Kitchen Lack of Transportation (Non-Medical):   Physical Activity:   . Days  of Exercise per Week:   . Minutes of Exercise per Session:   Stress:   . Feeling of Stress :   Social Connections:   . Frequency of Communication with Friends and Family:   . Frequency of Social Gatherings with Friends and Family:   . Attends Religious Services:   . Active Member of Clubs or Organizations:   . Attends BankerClub or Organization Meetings:   Marland Kitchen. Marital Status:   Intimate Partner Violence:   . Fear of Current or Ex-Partner:   . Emotionally Abused:   Marland Kitchen. Physically Abused:   . Sexually Abused:     Outpatient Medications Prior to Visit  Medication Sig Dispense Refill  . albuterol (VENTOLIN HFA) 108 (90 Base) MCG/ACT inhaler INHALE TWO PUFFS INTO LUNGS EVERY 6 HOURS AS NEEDED FOR WHEEZING OR SHORTNESS OF BREATH 18 g 6  . ARNUITY ELLIPTA 100 MCG/ACT AEPB Inhale 1 puff into the lungs daily. For 1-2 weeks during signs and symptoms of upper respiratory infection. 30 each 2  . azelastine (ASTELIN) 0.1 % nasal spray INSTILL 2 SPRAYS IN EACH NOSTRIL TWICE DAILY AS NEEDED 30  mL 6  . diclofenac (VOLTAREN) 75 MG EC tablet Take 75 mg by mouth 2 (two) times daily with a meal.  2  . fluticasone (FLONASE) 50 MCG/ACT nasal spray PLACE TWO SPRAYS INTO EACH NOSTRIL TWICE DAILY 16 g 3  . levocetirizine (XYZAL) 5 MG tablet Take 1 tablet (5 mg total) by mouth every morning. 90 tablet 0  . montelukast (SINGULAIR) 10 MG tablet TAKE 1 TABLET BY MOUTH AT BEDTIME 30 tablet 5  . morphine (MSIR) 15 MG tablet Take 15 mg by mouth every 8 (eight) hours as needed. Back Pain      . pantoprazole (PROTONIX) 40 MG tablet Take 1 tablet (40 mg total) by mouth daily. 90 tablet 1  . SYMBICORT 160-4.5 MCG/ACT inhaler INHALE TWO PUFFS INTO THE LUNGS TWICE DAILY 10.2 g 5  . tiZANidine (ZANAFLEX) 4 MG tablet Take 4 mg by mouth 2 (two) times daily as needed.    Marland Kitchen. ipratropium-albuterol (DUONEB) 0.5-2.5 (3) MG/3ML SOLN Take 3 mLs by nebulization every 4 (four) hours as needed. 360 mL 0   No facility-administered medications prior to visit.    No Known Allergies  Review of Systems  Constitutional: Negative for fatigue and fever.  HENT: Negative.   Eyes: Negative.   Respiratory: Negative.   Gastrointestinal: Negative.   Genitourinary: Negative.   Musculoskeletal: Positive for back pain.       Painful swelling in left leg  Skin: Positive for color change. Negative for rash.  Neurological: Negative for light-headedness and numbness.  Psychiatric/Behavioral: The patient is not nervous/anxious.        Objective:    Physical Exam Constitutional:      Appearance: Normal appearance.  HENT:     Head: Normocephalic.  Eyes:     Conjunctiva/sclera: Conjunctivae normal.  Cardiovascular:     Rate and Rhythm: Normal rate and regular rhythm.     Pulses: Normal pulses.     Heart sounds: Normal heart sounds.  Pulmonary:     Effort: Pulmonary effort is normal.     Breath sounds: Normal breath sounds.  Abdominal:     General: Bowel sounds are normal.  Musculoskeletal:        General: Swelling  and tenderness present.     Cervical back: Neck supple.  Skin:    General: Skin is warm.     Findings: Bruising present.  Neurological:     Mental Status: She is alert and oriented to person, place, and time.  Psychiatric:        Mood and Affect: Mood normal.        Behavior: Behavior normal.     BP 130/76   Pulse (!) 55   Temp 98 F (36.7 C)   Ht 5\' 7"  (1.702 m)   Wt 180 lb (81.6 kg)   LMP 08/30/2002 (Approximate)   SpO2 97%   BMI 28.19 kg/m  Wt Readings from Last 3 Encounters:  01/02/20 180 lb (81.6 kg)  09/12/19 184 lb (83.5 kg)  08/21/19 183 lb (83 kg)    Health Maintenance Due  Topic Date Due  . HIV Screening  Never done  . MAMMOGRAM  12/15/2019     Lab Results  Component Value Date   TSH 2.12 12/14/2017   Lab Results  Component Value Date   WBC 6.3 08/28/2019   HGB 14.9 08/28/2019   HCT 43.7 08/28/2019   MCV 91 08/28/2019   PLT 240 08/28/2019   Lab Results  Component Value Date   NA 142 09/12/2019   K 4.0 09/12/2019   CO2 27 09/12/2019   GLUCOSE 82 09/12/2019   BUN 15 09/12/2019   CREATININE 0.67 09/12/2019   BILITOT 0.2 09/12/2019   ALKPHOS 105 09/12/2019   AST 18 09/12/2019   ALT 25 09/12/2019   PROT 6.0 09/12/2019   ALBUMIN 3.8 09/12/2019   CALCIUM 8.7 09/12/2019   ANIONGAP 6 08/06/2014   Lab Results  Component Value Date   CHOL 169 08/28/2019   Lab Results  Component Value Date   HDL 47 08/28/2019   Lab Results  Component Value Date   LDLCALC 98 08/28/2019   Lab Results  Component Value Date   TRIG 135 08/28/2019   Lab Results  Component Value Date   CHOLHDL 3.6 08/28/2019   Lab Results  Component Value Date   HGBA1C 5.7 08/28/2019       Assessment & Plan:   Problem List Items Addressed This Visit      Cardiovascular and Mediastinum   Varicose veins of bilateral lower extremities with pain - Primary    She is a 59 year old female who presents to clinic with varicose veins.  This is not new for patient, the last  2 weeks varicose veins have become inflamed, painful, itching, throbbing with skin discoloration.  Patient has never been evaluated for varicose veins.  Patient reports pain of 3 out of 6 on the pain scale of 0-10.  Patient reports a swollen nodule on the left calf.  She denies fever, muscle cramping, shortness of breath and heaviness.  On assessment a painful nodule is  palpated on left calf. Discoloration to upper front shin all the way to  thighs.  Patient has a family history of DVTs. Lateral lower extremity Dopplers ordered. Advise to wear compression socks.  Elevate feet. Referral to vein specialist completed. Provided education to patient with printed handouts given. Patient is to follow-up with unresolved or worsening symptoms.      Relevant Orders   Ambulatory referral to Vascular Surgery   59 Venous Img Lower Bilateral       Meds ordered this encounter  Medications  . ibuprofen (ADVIL) 600 MG tablet    Sig: Take 1 tablet (600 mg total) by mouth every 8 (eight) hours as needed.    Dispense:  30 tablet    Refill:  0    Order Specific  Question:   Supervising Provider    Answer:   Arville Care A [[id number]]     Daryll Drown, NP

## 2020-01-08 ENCOUNTER — Other Ambulatory Visit: Payer: Self-pay | Admitting: Allergy & Immunology

## 2020-01-20 ENCOUNTER — Other Ambulatory Visit: Payer: Self-pay | Admitting: Allergy & Immunology

## 2020-01-22 ENCOUNTER — Ambulatory Visit (INDEPENDENT_AMBULATORY_CARE_PROVIDER_SITE_OTHER): Payer: Medicare Other | Admitting: Allergy & Immunology

## 2020-01-22 ENCOUNTER — Other Ambulatory Visit: Payer: Self-pay

## 2020-01-22 ENCOUNTER — Encounter: Payer: Self-pay | Admitting: Allergy & Immunology

## 2020-01-22 VITALS — BP 124/70 | HR 66 | Temp 98.0°F | Resp 16 | Wt 177.6 lb

## 2020-01-22 DIAGNOSIS — J3089 Other allergic rhinitis: Secondary | ICD-10-CM

## 2020-01-22 DIAGNOSIS — J4540 Moderate persistent asthma, uncomplicated: Secondary | ICD-10-CM

## 2020-01-22 DIAGNOSIS — J302 Other seasonal allergic rhinitis: Secondary | ICD-10-CM | POA: Diagnosis not present

## 2020-01-22 NOTE — Progress Notes (Signed)
[address] [place] [address] Dept: [phone]  FOLLOW UP NOTE  Patient ID: [name], female    DOB: 1961-05-26  Age: 59 y.o. MRN: [id number] Date of Office Visit: 01/22/2020  Assessment  Chief Complaint: Follow-up and Asthma  HPI [name] is a 59 year old female who presents for follow-up of moderate persistent asthma and seasonal perennial allergic rhinitis.  She was last seen on August 28, 2019 by Dr. Dellis Anes.  Moderate persistent asthma is reported as moderately controlled with the use of Symbicort 160-2 puffs twice a day and albuterol as needed.  She reports occasional dry to productive cough.  The sputum is clear to white in color. She denies any wheezing, tightness in chest, shortness of breath and nocturnal awakenings.  Also, she denies any trips to the emergency room or urgent care due to breathing problems and has not required any systemic steroids since she was last seen.  Allergic rhinitis is reported as moderately controlled with fluticasone nasal spray, Astelin nasal spray, and cetirizine 10 mg once a day.  She reports occasional clear rhinorrhea and nasal congestion for which her nasal sprays help.  She denies any postnasal drip and itchy watery eyes.  Current medications are as listed in the chart.   Drug Allergies:  No Known Allergies  Review of Systems: Review of Systems  Constitutional: Negative for chills and fever.  HENT: Positive for congestion. Negative for nosebleeds and sore throat.   Eyes: Negative for blurred vision.  Respiratory: Positive for cough. Negative for shortness of breath and wheezing.   Cardiovascular: Negative for chest pain and palpitations.  Gastrointestinal: Negative for abdominal pain and heartburn.  Genitourinary: Negative for dysuria.  Skin: Negative for itching and rash.  Neurological: Negative for headaches.  Endo/Heme/Allergies: Positive for environmental allergies.      Physical Exam: BP 124/70    Pulse 66   Temp 98 F (36.7 C) (Temporal)   Resp 16   Wt 177 lb 9.6 oz (80.6 kg)   LMP 08/30/2002 (Approximate)   SpO2 96%   BMI 27.82 kg/m    Physical Exam Vitals reviewed.  Constitutional:      Appearance: Normal appearance.  HENT:     Head: Normocephalic and atraumatic.     Comments: Pharynx normal. Ears normal. Eyes normal. Nose slightly erythematous and moderately edematous with no drainage noted.    Right Ear: Tympanic membrane, ear canal and external ear normal.     Left Ear: Tympanic membrane, ear canal and external ear normal.     Mouth/Throat:     Mouth: Mucous membranes are moist.     Pharynx: Oropharynx is clear.  Eyes:     Conjunctiva/sclera: Conjunctivae normal.  Cardiovascular:     Rate and Rhythm: Regular rhythm.     Heart sounds: Normal heart sounds.  Pulmonary:     Effort: Pulmonary effort is normal.     Breath sounds: Normal breath sounds.     Comments: Lungs clear to auscultation Skin:    General: Skin is warm.  Neurological:     Mental Status: She is alert and oriented to person, place, and time.  Psychiatric:        Mood and Affect: Mood normal.        Behavior: Behavior normal.        Thought Content: Thought content normal.        Judgment: Judgment normal.     Diagnostics: FVC 2.76 L, FEV1 2.16 L.  Predicted FVC  3.77 L, FEV1 2.95 L.  Spirometry indicates mild restriction.  Spirometry is consistent with previous spirometry.  Assessment and Plan: 1. Moderate persistent asthma, uncomplicated   2. Seasonal and perennial allergic rhinitis     Moderate persistent asthma Continue Symbicort 162 puffs twice a day with spacer to help prevent cough and wheeze Continue albuterol 2 puffs every 4 hours as needed for cough, wheeze, tightness in chest, or shortness of breath. For asthma flares use Arnuity 1 puff twice a day for 1 to 2 weeks. Continue to consider Harrington Challenger and turn in paperwork when ready to start Asthma control goals:   Full  participation in all desired activities (may need albuterol before activity)  Albuterol use two time or less a week on average (not counting use with activity)  Cough interfering with sleep two time or less a month  Oral steroids no more than once a year  No hospitalizations  Seasonal and perennial allergic rhinitis Continue avoidance measures Continue fluticasone nasal spray using 2 sprays each nostril once a day as needed for stuffy nose. Continue Astelin nasal spray using 1 to 2 sprays each nostril twice a day as needed for nasal drainage Continue cetirizine 10 mg once a day as needed for runny nose or itching  Please let us know if this treatment plan is not working well for you. Schedule follow-up appointment in 4 months.    Return in about 4 months (around 05/24/2020), or if symptoms worsen or fail to improve.    Thank you for the opportunity to care for this patient.  Please do not hesitate to contact me with questions.  Nehemiah Settle, FNP [facility]   I performed a history and physical examination of the patient and discussed her management with the Nurse Practitioner. I reviewed the Nurse Practitioner's note and agree with the documented findings and plan of care. The note in its entirety was edited by myself, including the physical exam, assessment, and plan.   Malachi Bonds, MD [facility]

## 2020-01-22 NOTE — Patient Instructions (Signed)
Moderate persistent asthma Continue Symbicort 162 puffs twice a day with spacer to help prevent cough and wheeze Continue albuterol 2 puffs every 4 hours as needed for cough, wheeze, tightness in chest, or shortness of breath. For asthma flares use Arnuity 1 puff twice a day for 1 to 2 weeks. Continue to consider Harrington Challenger and turn in paperwork when ready to start Asthma control goals:   Full participation in all desired activities (may need albuterol before activity)  Albuterol use two time or less a week on average (not counting use with activity)  Cough interfering with sleep two time or less a month  Oral steroids no more than once a year  No hospitalizations  Seasonal and perennial allergic rhinitis Continue avoidance measures Continue fluticasone nasal spray using 2 sprays each nostril once a day as needed for stuffy nose. Continue Astelin nasal spray using 1 to 2 sprays each nostril twice a day as needed for nasal drainage Continue cetirizine 10 mg once a day as needed for runny nose or itching  Please let us know if this treatment plan is not working well for you. Schedule follow-up appointment in 4 months.

## 2020-02-05 ENCOUNTER — Other Ambulatory Visit: Payer: Self-pay

## 2020-02-05 DIAGNOSIS — I83813 Varicose veins of bilateral lower extremities with pain: Secondary | ICD-10-CM

## 2020-02-21 ENCOUNTER — Ambulatory Visit (HOSPITAL_COMMUNITY)
Admission: RE | Admit: 2020-02-21 | Discharge: 2020-02-21 | Disposition: A | Discharge status: Home / Self Care | Payer: Medicare Other | Source: Ambulatory Visit | Attending: Vascular Surgery | Admitting: Vascular Surgery

## 2020-02-21 ENCOUNTER — Ambulatory Visit (INDEPENDENT_AMBULATORY_CARE_PROVIDER_SITE_OTHER): Payer: Medicare Other | Admitting: Physician Assistant

## 2020-02-21 ENCOUNTER — Other Ambulatory Visit: Payer: Self-pay

## 2020-02-21 VITALS — BP 123/74 | HR 69 | Temp 98.0°F | Resp 16 | Ht 67.0 in | Wt 176.0 lb

## 2020-02-21 DIAGNOSIS — I83813 Varicose veins of bilateral lower extremities with pain: Secondary | ICD-10-CM | POA: Diagnosis present

## 2020-02-21 DIAGNOSIS — I872 Venous insufficiency (chronic) (peripheral): Secondary | ICD-10-CM

## 2020-02-21 NOTE — Progress Notes (Signed)
Office Note     CC:  follow up Requesting Provider:  Raliegh Ip, DO  HPI: [name] is a 59 y.o. (08-Mar-1961) female who presents for evaluation of varicose veins and spider veins especially of left lower extremity.  Patient states she has noticed these veins for years however over the past several months believes these veins are more prominent.  Patient states her mother has large and symptomatic varicose veins.  She believes these veins are worse after being on her feet for most of the day.  She has not tried compression or elevation.  She denies any history of DVT, venous ulcerations, trauma, or vascular intervention of bilateral lower extremities.  She also denies any claudication, rest pain, or nonhealing wounds of bilateral lower extremities.  She denies tobacco use.  Patient also notes a specific cluster of spider veins of her left proximal shin that causes her the most discomfort.   Past Medical History:  Diagnosis Date  . Arthritis    Spine, R knee  . Asthma   . Back pain   . Cough 09/03/2014   Followed in Pulmonary clinic/ [facility]/ [place] - sinus CT 09/08/2014> Mild mucosal thickening involves the paranasal sinuses  - Eos 1.2  08/06/14 - Allergy profile 11/27/14 >  Eos 1.0/ IgE  41 with neg RAST    . GERD (gastroesophageal reflux disease)   . History of blood transfusion    Back surgery    Past Surgical History:  Procedure Laterality Date  . ABDOMINAL HYSTERECTOMY     "cysts", pelvic pain  . BACK SURGERY    . KNEE ARTHROSCOPY Right 2017  . SPINE SURGERY  2010   fusion  . SPINE SURGERY  2014   remove hardware    Social History   Socioeconomic History  . Marital status: Significant Other    Spouse name: Clide Cliff  . Number of children: 2  . Years of education: 61  . Highest education level: Not on file  Occupational History  . Occupation: disabled    Comment: back  Tobacco Use  . Smoking status: Never Smoker  . Smokeless tobacco: Never Used  Vaping  Use  . Vaping Use: Never used  Substance and Sexual Activity  . Alcohol use: No  . Drug use: No  . Sexual activity: Yes    Birth control/protection: Surgical  Other Topics Concern  . Not on file  Social History Narrative   Lives with Clide Cliff - 27 years   Clide Cliff has end stage colon cancer   Two sons/ grandchildren   Disabled from back pain/had scoliosis. Has had back reconstruction.    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    Social Determinants of Health   Financial Resource Strain:   . Difficulty of Paying Living Expenses: Not on file  Food Insecurity:   . Worried About Programme researcher, broadcasting/film/video in the Last Year: Not on file  . Ran Out of Food in the Last Year: Not on file  Transportation Needs:   . Lack of Transportation (Medical): Not on file  . Lack of Transportation (Non-Medical): Not on file  Physical Activity:   . Days of Exercise per Week: Not on file  . Minutes of Exercise per Session: Not on file  Stress:   . Feeling of Stress : Not on file  Social Connections:   . Frequency of Communication with Friends and Family: Not on file  . Frequency of Social Gatherings with Friends and Family: Not on file  .  Attends Religious Services: Not on file  . Active Member of Clubs or Organizations: Not on file  . Attends Banker Meetings: Not on file  . Marital Status: Not on file  Intimate Partner Violence:   . Fear of Current or Ex-Partner: Not on file  . Emotionally Abused: Not on file  . Physically Abused: Not on file  . Sexually Abused: Not on file    Family History  Problem Relation Age of Onset  . Emphysema Maternal Grandmother        never smoker  . COPD Maternal Grandmother   . Emphysema Maternal Uncle        smoked  . COPD Maternal Uncle   . Arthritis Mother   . Diabetes Mother   . Hyperlipidemia Mother   . Hypertension Mother   . Early death Father 35       MVA  . Asthma Sister   . Asthma Brother   . Heart disease Maternal Grandfather     Current Outpatient  Medications  Medication Sig Dispense Refill  . albuterol (VENTOLIN HFA) 108 (90 Base) MCG/ACT inhaler INHALE TWO PUFFS INTO LUNGS EVERY 6 HOURS AS NEEDED FOR WHEEZING OR SHORTNESS OF BREATH 18 g 6  . ARNUITY ELLIPTA 100 MCG/ACT AEPB Inhale 1 puff into the lungs daily. For 1-2 weeks during signs and symptoms of upper respiratory infection. 30 each 2  . azelastine (ASTELIN) 0.1 % nasal spray INSTILL 2 SPRAYS IN EACH NOSTRIL TWICE DAILY AS NEEDED 30 mL 6  . diclofenac (VOLTAREN) 75 MG EC tablet Take 75 mg by mouth 2 (two) times daily with a meal.  2  . fluticasone (FLONASE) 50 MCG/ACT nasal spray PLACE TWO SPRAYS INTO EACH NOSTRIL TWICE DAILY 16 g 3  . ibuprofen (ADVIL) 600 MG tablet Take 1 tablet (600 mg total) by mouth every 8 (eight) hours as needed. 30 tablet 0  . levocetirizine (XYZAL) 5 MG tablet Take 1 tablet (5 mg total) by mouth every morning. 90 tablet 0  . montelukast (SINGULAIR) 10 MG tablet TAKE 1 TABLET BY MOUTH AT BEDTIME 30 tablet 5  . morphine (MSIR) 15 MG tablet Take 15 mg by mouth every 8 (eight) hours as needed. Back Pain      . pantoprazole (PROTONIX) 40 MG tablet TAKE 1 TABLET BY MOUTH DAILY 90 tablet 1  . SYMBICORT 160-4.5 MCG/ACT inhaler INHALE TWO PUFFS INTO THE LUNGS TWICE DAILY 10.2 g 5  . tiZANidine (ZANAFLEX) 4 MG tablet Take 4 mg by mouth 2 (two) times daily as needed.    Marland Kitchen ipratropium-albuterol (DUONEB) 0.5-2.5 (3) MG/3ML SOLN Take 3 mLs by nebulization every 4 (four) hours as needed. 360 mL 0   No current facility-administered medications for this visit.    No Known Allergies   REVIEW OF SYSTEMS:   [X]  denotes positive finding, [ ]  denotes negative finding Cardiac  Comments:  Chest pain or chest pressure:    Shortness of breath upon exertion:    Short of breath when lying flat:    Irregular heart rhythm:        Vascular    Pain in calf, thigh, or hip brought on by ambulation:    Pain in feet at night that wakes you up from your sleep:     Blood clot in  your veins:    Leg swelling:         Pulmonary    Oxygen at home:    Productive cough:     Wheezing:  Neurologic    Sudden weakness in arms or legs:     Sudden numbness in arms or legs:     Sudden onset of difficulty speaking or slurred speech:    Temporary loss of vision in one eye:     Problems with dizziness:         Gastrointestinal    Blood in stool:     Vomited blood:         Genitourinary    Burning when urinating:     Blood in urine:        Psychiatric    Major depression:         Hematologic    Bleeding problems:    Problems with blood clotting too easily:        Skin    Rashes or ulcers:        Constitutional    Fever or chills:      PHYSICAL EXAMINATION:  Vitals:   02/21/20 1432  BP: 123/74  Pulse: 69  Resp: 16  Temp: 98 F (36.7 C)  TempSrc: Temporal  SpO2: 93%  Weight: 176 lb (79.8 kg)  Height: 5\' 7"  (1.702 m)    General:  WDWN in NAD; vital signs documented above Gait: Not observed HENT: WNL, normocephalic Pulmonary: normal non-labored breathing Cardiac: regular HR Abdomen: soft, NT, no masses Skin: without rashes Vascular Exam/Pulses:  Right Left  Radial 2+ (normal) 2+ (normal)  DP 2+ (normal) 1+ (weak)  PT 1+ (weak) 2+ (normal)   Extremities: No significant edema of bilateral lower extremities; several areas of varicosities of left lower extremity mostly in line with the path of the greater saphenous vein; large cluster of spider veins at the proximal left shin with tenderness to touch; this cluster of veins is pictured below; no venous ulcerations noted Musculoskeletal: no muscle wasting or atrophy  Neurologic: A&O X 3;  No focal weakness or paresthesias are detected Psychiatric:  The pt has Normal affect.        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

## 2020-03-10 ENCOUNTER — Other Ambulatory Visit: Payer: Self-pay | Admitting: Allergy & Immunology

## 2020-03-11 ENCOUNTER — Other Ambulatory Visit: Payer: Self-pay | Admitting: Allergy & Immunology

## 2020-03-24 ENCOUNTER — Telehealth: Payer: Self-pay | Admitting: Allergy & Immunology

## 2020-03-24 ENCOUNTER — Other Ambulatory Visit: Payer: Self-pay

## 2020-03-24 MED ORDER — FLUTICASONE PROPIONATE 50 MCG/ACT NA SUSP: NASAL | 2 refills | Status: DC

## 2020-03-24 NOTE — Telephone Encounter (Signed)
Refill sent and patient has been notified 

## 2020-03-24 NOTE — Telephone Encounter (Signed)
Patient states that her Aleda Grana is being denied and does not know why. Patient has been out of Flonase for over a month and needs to get it refilled.  Please advise.

## 2020-04-07 ENCOUNTER — Other Ambulatory Visit: Payer: Self-pay | Admitting: Allergy & Immunology

## 2020-05-18 NOTE — Patient Instructions (Addendum)
Moderate persistent asthma Start prednisone 10 mg taking 2 tablets twice a day for 3 days, then on the fourth day take 2 tablets in the morning and on the fifth day take 1 tablet and stop. If your symptoms do not get better please go the emergency room. We also have an on call provider that you can call should you need to Continue Symbicort 160/4.5 mcg- 2 puffs twice a day with spacer to help prevent cough and wheeze Start Arnuity 1 inhalation once a day for 1-2 weeks. Continue albuterol 2 puffs every 4 hours as needed for cough, wheeze, tightness in chest, or shortness of breath. Also, may use albuterol 2 puffs 5-15 minutes prior to exercise For asthma flares add on Arnuity 1 inhalation once a day for 1-2 weeks Continue to consider Har[employer] Challenger and turn in the paper work when ready to start  Seasonal and perennial allergic rhinitis Continue fluticasone nasal spray 2 sprays each nostril once a day as needed for stuffy nose. Continue Astelin nasal spray 1-2 sprays each nostril twice a day as needed for nasal drainage.  Continue cetirizine 10 mg once a day as needed for runny nose or itching  Please let us know if this treatment plan is not working well for you. Schedule a follow up appointment in 2 months

## 2020-05-20 ENCOUNTER — Other Ambulatory Visit: Payer: Self-pay

## 2020-05-20 ENCOUNTER — Ambulatory Visit (INDEPENDENT_AMBULATORY_CARE_PROVIDER_SITE_OTHER): Payer: Medicare Other | Admitting: Family

## 2020-05-20 ENCOUNTER — Ambulatory Visit: Payer: Medicare Other | Admitting: Family

## 2020-05-20 ENCOUNTER — Encounter: Payer: Self-pay | Admitting: Family

## 2020-05-20 VITALS — BP 138/82 | HR 58 | Resp 20 | Ht 67.0 in

## 2020-05-20 DIAGNOSIS — J302 Other seasonal allergic rhinitis: Secondary | ICD-10-CM

## 2020-05-20 DIAGNOSIS — J4541 Moderate persistent asthma with (acute) exacerbation: Secondary | ICD-10-CM | POA: Diagnosis not present

## 2020-05-20 DIAGNOSIS — J3089 Other allergic rhinitis: Secondary | ICD-10-CM | POA: Diagnosis not present

## 2020-05-20 MED ORDER — ALBUTEROL SULFATE HFA 108 (90 BASE) MCG/ACT IN AERS
INHALATION_SPRAY | RESPIRATORY_TRACT | 1 refills | Status: DC
Start: 2020-05-20 — End: 2021-05-23

## 2020-05-20 MED ORDER — ARNUITY ELLIPTA 100 MCG/ACT IN AEPB: 1.0000 | INHALATION_SPRAY | Freq: Every day | RESPIRATORY_TRACT | 2 refills | Status: DC

## 2020-05-20 NOTE — Progress Notes (Signed)
[address] [place] [address] Dept: [phone]  FOLLOW UP NOTE  Patient ID: [name], female    DOB: [birth year]  Age: 59 y.o. MRN: [id number] Date of Office Visit: 05/20/2020  Assessment  Chief Complaint: Asthma  HPI [name] is a 59 year old female who presents today for follow-up of moderate persistent asthma and seasonal and perennial allergic rhinitis.  She was last seen by myself on January 22, 2020.  Moderate persistent asthma is reported as not well controlled with Symbicort 160/4.5 mcg 2 puffs twice a day with spacer and albuterol as needed.  She reports for the past 2 days she has had tightness in her chest occasional dry cough that is productive with thick clear sputum and a little shortness of breath with exertion.  She also reports that her boyfriend and has heard her wheezing.  She started on Mucinex 2 days ago.  She lost her Arnuity inhaler that she is supposed to start for asthma flares.  Since her last office visit she has not required any systemic steroids or made any trips to the emergency room or urgent care due to breathing problems.  Her asthma does not wake her up at night.  She continues to think about [place].  She is not sure where the paperwork is located at this time.  She reports that she does not like to fill out paperwork.  She denies any fever or chills.  Seasonal and perennial allergic rhinitis is reported as moderately controlled with fluticasone nasal spray as needed, Astelin nasal spray as needed, and cetirizine 10 mg once a day as needed.  She reports nasal congestion at night and a small amount of postnasal drip.  She denies any rhinorrhea and sinus tenderness.   Drug Allergies:  No Known Allergies  Review of Systems: Review of Systems  Constitutional: Negative for chills and fever.  HENT:       Reports occasional nasal congestion at night and post nasal drip and denies rhinorrhea and sinus tenderness  Eyes:       Denies  itchy watery eyes  Respiratory: Positive for cough, shortness of breath and wheezing.        Reports dry and at times productive cough. Sputum is thick and clear. Also reports wheezing, tightness in chest, and shortness of breath with exertion. Denies any fever or chills  Cardiovascular: Negative for chest pain and palpitations.  Gastrointestinal: Negative for abdominal pain and heartburn.  Genitourinary: Negative for dysuria.  Skin: Negative for itching and rash.  Neurological: Negative for headaches.  Endo/Heme/Allergies: Positive for environmental allergies.    Physical Exam: BP 138/82    Pulse (!) 58    Resp 20    Ht 5\' 7"  (1.702 m)    LMP 08/30/2002 (Approximate)    SpO2 97%    BMI 27.57 kg/m    Physical Exam Constitutional:      Appearance: Normal appearance.  HENT:     Head: Normocephalic and atraumatic.     Right Ear: Tympanic membrane, ear canal and external ear normal.     Left Ear: Tympanic membrane, ear canal and external ear normal.     Nose: Nose normal.     Mouth/Throat:     Mouth: Mucous membranes are moist.     Pharynx: Oropharynx is clear.  Eyes:     Conjunctiva/sclera: Conjunctivae normal.  Cardiovascular:     Rate and Rhythm: Regular rhythm.     Heart sounds: Normal heart sounds.  Pulmonary:     Effort: Pulmonary effort is normal.     Comments: Wheezing heard in left upper lobe and right lower lobe. Lungs clear in other lobes. After 4 puffs of Xopenex wheezing still present in left upper lobe. She reports that her breathing is better. Approximately 20 minutes later given an additional 4 puffs of Xopenex. Minimal wheezing heard still in left upper lobe. Reports that she feels fine. Able to speak in full sentences. Musculoskeletal:     Cervical back: Neck supple.  Skin:    General: Skin is warm.  Neurological:     Mental Status: She is alert and oriented to person, place, and time.  Psychiatric:        Mood and Affect: Mood normal.        Behavior: Behavior  normal.        Thought Content: Thought content normal.        Judgment: Judgment normal.     Diagnostics: 2.18 L, FEV1 1.63 L.  Predicted FVC 3.75 L, FEV1 2.92 L.  Spirometry indicates moderate restriction.  Status post bronchodilator response shows FVC 2.44 L, FEV1 1.82 L.  Spirometry indicates moderate restriction with a 12% change in FEV1.  20 minutes later she was given an additional 4 puffs of Xopenex and her FVC was 2.55 L, FEV1 1.91 L.  Spirometry indicates mild restriction with a 17% change in FEV1 from the original spirometry.  Assessment and Plan: 1. Moderate persistent asthma with acute exacerbation   2. Seasonal and perennial allergic rhinitis     Meds ordered this encounter  Medications   albuterol (VENTOLIN HFA) 108 (90 Base) MCG/ACT inhaler    Sig: INHALE TWO PUFFS INTO LUNGS EVERY 4 HOURS AS NEEDED FOR WHEEZING OR SHORTNESS OF BREATH    Dispense:  18 g    Refill:  1   ARNUITY ELLIPTA 100 MCG/ACT AEPB    Sig: Inhale 1 puff into the lungs daily. For 1-2 weeks during signs and symptoms of upper respiratory infection.    Dispense:  30 each    Refill:  2    Patient Instructions  Moderate persistent asthma Start prednisone 10 mg taking 2 tablets twice a day for 3 days, then on the fourth day take 2 tablets in the morning and on the fifth day take 1 tablet and stop. If your symptoms do not get better please go the emergency room. We also have an on call provider that you can call should you need to Continue Symbicort 160/4.5 mcg- 2 puffs twice a day with spacer to help prevent cough and wheeze Start Arnuity 1 inhalation once a day for 1-2 weeks. Continue albuterol 2 puffs every 4 hours as needed for cough, wheeze, tightness in chest, or shortness of breath. Also, may use albuterol 2 puffs 5-15 minutes prior to exercise For asthma flares add on Arnuity 1 inhalation once a day for 1-2 weeks Continue to consider Har[employer] Challenger and turn in the paper work when ready to  start  Seasonal and perennial allergic rhinitis Continue fluticasone nasal spray 2 sprays each nostril once a day as needed for stuffy nose. Continue Astelin nasal spray 1-2 sprays each nostril twice a day as needed for nasal drainage.  Continue cetirizine 10 mg once a day as needed for runny nose or itching  Please let us know if this treatment plan is not working well for you. Schedule a follow up appointment in 2 months   Return in about 2 months (around 07/20/2020),  or if symptoms worsen or fail to improve.    Thank you for the opportunity to care for this patient.  Please do not hesitate to contact me with questions.  Althea Charon, FNP [facility]

## 2020-06-07 ENCOUNTER — Other Ambulatory Visit: Payer: Self-pay | Admitting: Allergy & Immunology

## 2020-06-27 HISTORY — PX: KNEE ARTHROSCOPY: SHX127

## 2020-07-20 ENCOUNTER — Other Ambulatory Visit: Payer: Self-pay | Admitting: Allergy & Immunology

## 2020-07-20 MED ORDER — MONTELUKAST SODIUM 10 MG PO TABS
10.0000 mg | ORAL_TABLET | Freq: Every day | ORAL | 3 refills | Status: DC
Start: 2020-07-20 — End: 2020-07-29

## 2020-07-20 NOTE — Addendum Note (Signed)
Addended by: Devoria Glassing on: 07/20/2020 09:22 AM   Modules accepted: Orders

## 2020-07-22 ENCOUNTER — Encounter: Payer: Self-pay | Admitting: Allergy & Immunology

## 2020-07-22 ENCOUNTER — Other Ambulatory Visit: Payer: Self-pay

## 2020-07-22 ENCOUNTER — Ambulatory Visit (INDEPENDENT_AMBULATORY_CARE_PROVIDER_SITE_OTHER): Payer: Medicare Other | Admitting: Allergy & Immunology

## 2020-07-22 VITALS — BP 124/80 | HR 66 | Resp 16

## 2020-07-22 DIAGNOSIS — J3089 Other allergic rhinitis: Secondary | ICD-10-CM

## 2020-07-22 DIAGNOSIS — J302 Other seasonal allergic rhinitis: Secondary | ICD-10-CM

## 2020-07-22 DIAGNOSIS — J4540 Moderate persistent asthma, uncomplicated: Secondary | ICD-10-CM | POA: Diagnosis not present

## 2020-07-22 DIAGNOSIS — J309 Allergic rhinitis, unspecified: Secondary | ICD-10-CM

## 2020-07-22 NOTE — Progress Notes (Signed)
FOLLOW UP  Date of Service/Encounter:  07/22/20   Assessment:   Moderate persistent asthma, uncomplicated - with acute exacerbation (4th round of prednisone in 12 months)   Seasonal and perennial allergic rhinitis(indoor and outdoor molds, cat, dog, cockroach)  Chronic back pain - with a history of scoliosis (rods placement in 2010) and now on disability  Plan/Recommendations:   1. Moderate persistent asthma, uncomplicated - Lung testing looked bad again. - I am going to get some labs today to look for some serious difficult to control asthma. - I am also getting a chest X-ray to follow up on the chest CT from 2016.  - Daily controller medication(s): Symbicort 160/4.105mcg two puffs twice daily with spacer - Prior to physical activity: ProAir 2 puffs 10-15 minutes before physical activity. - Rescue medications: ProAir 4 puffs every 4-6 hours as needed or albuterol nebulizer one vial every 4-6 hours as needed - Changes during respiratory infections or worsening symptoms: Add on Asmanex 2 puffs twice daily for ONE TO TWO WEEKS. - Asthma control goals:  * Full participation in all desired activities (may need albuterol before activity) * Albuterol use two time or less a week on average (not counting use with activity) * Cough interfering with sleep two time or less a month * Oral steroids no more than once a year * No hospitalizations  2. Chronic rhinitis (weeds, indoor molds, outdoor molds, cat, dog, and cockroach) - Continue with fluticasone nasal spray 1-2 sprays per nostril daily. - Continue with Astelin 2 sprays per nostril up to twice daily as needed.  - Continue with cetirizine 10mg  daily.  3. Return in about 6 weeks (around 09/02/2020).   Subjective:   [name] is a 60 y.o. female presenting today for follow up of No chief complaint on file.   [name] has a history of the following: Patient Active Problem List   Diagnosis Date Noted  . Varicose  veins of bilateral lower extremities with pain 01/02/2020  . Seasonal and perennial allergic rhinitis 04/15/2018  . 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 Chronic insomnia 11/29/2016  . Eosinophilia 03/29/2016  . Moderate persistent asthma, uncomplicated 03/29/2016  . 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 Other social stressor 03/29/2016  . 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 Arthritis of knee, degenerative 07/09/2015  . Chronic low back pain 04/15/2015  . Cough variant asthma 08/09/2014  . Pulmonary infiltrates with eosinophilia (HCC) 08/09/2014    History obtained from: chart review and patient.  [name] is a 60 y.o. female presenting for a follow up visit.  She was last seen by 60 in November 2021.  At that time, she was breathing, unsurprisingly, and was given a round of prednisone.  She was given Symbicort 2 puffs twice daily and started on Arnuity 1 puff once daily for 1 to 2 weeks.  For her rhinitis, she was continued on fluticasone as well as azelastine and cetirizine. We have been trying to get her onto [facility] for quite some time. However, she never brings back her income forms that she needs for the copay card.   Since that time, she reports that her breathing has been going well.   Asthma/Respiratory Symptom History: She remains on her daily controller medication. she has not brought back income forms for her [facility] approval. She tells me that she "hates paperwork". She does report a cough that really never goes away. If it does go away,  it is only for a couple of days and then it is back again. Sometimes it is "heavy" and she makes herself cough.  She has not had a fever during this entire time. She does not feel that she is having any issues with wheezing, although she is definitely having problems with it today on exam.   I did do some review of her imaging and she had a chest CT done in February 2016 that demonstrated:  Nonspecific scattered patchy mosaic attenuation/ground-glass attenuation airspace disease throughout all lobes of both lungs with slight upper lung predominance to the overall pattern. Differential considerations are broad and include post infectious bronchiolitis, toxin inhalation, hypersensitivity pneumonitis, and rheumatic interstitial lung disease as can be seen in the setting of rheumatoid arthritis and Sjogren's syndrome.   She never really had that fully worked up per the patient. She had this done when she was in the ED For evaluation of bronchitis. She was treated with prednisone at the time. It seems that she then saw Dr. Sherene Sires on a number of occasions, but her last visit with him was June 2016.  At that visit, she put him on prednisone.  She has not been back since. Nucala was discussed at that time, but just like the current day, this was never started.   Of note, she did work in the [employer] for years.  She then worked at [employer], leaving in 2014.  She is instability from her back injury.  Otherwise, there have been no changes to her past medical history, surgical history, family history, or social history.    Review of Systems  Constitutional: Negative.  Negative for chills, fever, malaise/fatigue and weight loss.  HENT: Negative for congestion, ear discharge, ear pain and sinus pain.   Eyes: Negative for pain, discharge and redness.  Respiratory: Positive for cough. Negative for sputum production, shortness of breath and wheezing.   Cardiovascular: Negative.  Negative for chest pain and palpitations.  Gastrointestinal: Negative for abdominal pain, constipation, diarrhea, heartburn, nausea and vomiting.  Skin: Negative.  Negative for itching and rash.  Neurological: Negative for dizziness and headaches.  Endo/Heme/Allergies: Positive for environmental allergies. Does not bruise/bleed easily.       Objective:   Blood pressure 124/80, pulse 66, resp. rate 16, last  menstrual period 08/30/2002, SpO2 97 %. There is no height or weight on file to calculate BMI.   Physical Exam:  Physical Exam Constitutional:      Appearance: She is well-developed.  HENT:     Head: Normocephalic and atraumatic.     Right Ear: Tympanic membrane, ear canal and external ear normal.     Left Ear: Tympanic membrane and ear canal normal.     Nose: No nasal deformity, septal deviation, mucosal edema, rhinorrhea or epistaxis.     Right Sinus: No maxillary sinus tenderness or frontal sinus tenderness.     Left Sinus: No maxillary sinus tenderness or frontal sinus tenderness.     Mouth/Throat:     Mouth: Oropharynx is clear and moist. Mucous membranes are not pale and not dry.     Pharynx: Uvula midline.  Eyes:     General:        Right eye: No discharge.        Left eye: No discharge.     Extraocular Movements: EOM normal.     Conjunctiva/sclera: Conjunctivae normal.     Right eye: Right conjunctiva is not injected. No chemosis.    Left eye: Left conjunctiva  is not injected. No chemosis.    Pupils: Pupils are equal, round, and reactive to light.  Cardiovascular:     Rate and Rhythm: Normal rate and regular rhythm.     Heart sounds: Normal heart sounds.  Pulmonary:     Effort: Pulmonary effort is normal. No tachypnea, accessory muscle usage or respiratory distress.     Breath sounds: Normal breath sounds. No wheezing, rhonchi or rales.     Comments: Inspiratory and expiratory wheezing throughout all lung fields.  Chest:     Chest wall: No tenderness.  Lymphadenopathy:     Cervical: No cervical adenopathy.  Skin:    Coloration: Skin is not pale.     Findings: No abrasion, erythema, petechiae or rash. Rash is not papular, urticarial or vesicular.  Neurological:     Mental Status: She is alert.  Psychiatric:        Mood and Affect: Mood and affect normal.      Diagnostic studies:    Spirometry: results abnormal (FEV1: 1.40/48%, FVC: 1.99/53%, FEV1/FVC: 70%).     Spirometry consistent with possible restrictive disease.   Allergy Studies: none       Malachi Bonds, MD  [facility]

## 2020-07-22 NOTE — Patient Instructions (Addendum)
1. Moderate persistent asthma, uncomplicated - Lung testing looked bad again. - I am going to get some labs today to look for some serious difficult to control asthma. - I am also getting a chest X-ray to follow up on the chest CT from 2016.  - Daily controller medication(s): Symbicort 160/4.74mcg two puffs twice daily with spacer - Prior to physical activity: ProAir 2 puffs 10-15 minutes before physical activity. - Rescue medications: ProAir 4 puffs every 4-6 hours as needed or albuterol nebulizer one vial every 4-6 hours as needed - Changes during respiratory infections or worsening symptoms: Add on Asmanex 2 puffs twice daily for ONE TO TWO WEEKS. - Asthma control goals:  * Full participation in all desired activities (may need albuterol before activity) * Albuterol use two time or less a week on average (not counting use with activity) * Cough interfering with sleep two time or less a month * Oral steroids no more than once a year * No hospitalizations  2. Chronic rhinitis (weeds, indoor molds, outdoor molds, cat, dog, and cockroach) - Continue with fluticasone nasal spray 1-2 sprays per nostril daily. - Continue with Astelin 2 sprays per nostril up to twice daily as needed.  - Continue with cetirizine 10mg  daily.  3. Return in about 6 weeks (around 09/02/2020).    Please inform 11/02/2020 of any Emergency Department visits, hospitalizations, or changes in symptoms. Call us before going to the ED for breathing or allergy symptoms since we might be able to fit you in for a sick visit. Feel free to contact us anytime with any questions, problems, or concerns.  It was a pleasure to see you again today!  Websites that have reliable patient information: 1. American Academy of Asthma, Allergy, and Immunology: www.aaaai.org 2. Food Allergy Research and Education (FARE): foodallergy.org 3. Mothers of Asthmatics: http://www.asthmacommunitynetwork.org 4. American College of Allergy, Asthma, and  Immunology: www.acaai.org   COVID-19 Vaccine Information can be found at: [place] For questions related to vaccine distribution or appointments, please email [email or web address] or call [phone].     "Like" [phone] on Facebook and Instagram for our latest updates!       Make sure you are registered to vote! If you have moved or changed any of your contact information, you will need to get this updated before voting!  In some cases, you MAY be able to register to vote online: [place]

## 2020-07-27 ENCOUNTER — Other Ambulatory Visit: Payer: Self-pay

## 2020-07-27 ENCOUNTER — Ambulatory Visit (HOSPITAL_COMMUNITY)
Admission: RE | Admit: 2020-07-27 | Discharge: 2020-07-27 | Disposition: A | Discharge status: Home / Self Care | Payer: Medicare Other | Source: Ambulatory Visit | Attending: Allergy & Immunology | Admitting: Allergy & Immunology

## 2020-07-27 DIAGNOSIS — J4540 Moderate persistent asthma, uncomplicated: Secondary | ICD-10-CM | POA: Diagnosis not present

## 2020-07-29 ENCOUNTER — Other Ambulatory Visit: Payer: Self-pay | Admitting: Allergy & Immunology

## 2020-07-31 LAB — CBC WITH DIFFERENTIAL/PLATELET
Basophils Absolute: 0.1 10*3/uL (ref 0.0–0.2)
Basos: 1 %
EOS (ABSOLUTE): 0.7 10*3/uL — ABNORMAL HIGH (ref 0.0–0.4)
Eos: 11 %
Hematocrit: 44.7 % (ref 34.0–46.6)
Hemoglobin: 14.7 g/dL (ref 11.1–15.9)
Immature Grans (Abs): 0 10*3/uL (ref 0.0–0.1)
Immature Granulocytes: 0 %
Lymphocytes Absolute: 2.2 10*3/uL (ref 0.7–3.1)
Lymphs: 32 %
MCH: 30.1 pg (ref 26.6–33.0)
MCHC: 32.9 g/dL (ref 31.5–35.7)
MCV: 92 fL (ref 79–97)
Monocytes Absolute: 0.4 10*3/uL (ref 0.1–0.9)
Monocytes: 6 %
Neutrophils Absolute: 3.2 10*3/uL (ref 1.4–7.0)
Neutrophils: 50 %
Platelets: 274 10*3/uL (ref 150–450)
RBC: 4.88 x10E6/uL (ref 3.77–5.28)
RDW: 12.7 % (ref 11.7–15.4)
WBC: 6.6 10*3/uL (ref 3.4–10.8)

## 2020-07-31 LAB — ASPERGILLUS PRECIPITINS
A.Fumigatus #1 Abs: NEGATIVE
Aspergillus Flavus Antibodies: NEGATIVE
Aspergillus Niger Antibodies: NEGATIVE
Aspergillus glaucus IgG: NEGATIVE
Aspergillus nidulans IgG: NEGATIVE
Aspergillus terreus IgG: NEGATIVE

## 2020-07-31 LAB — ANCA TITERS
Atypical pANCA: <1:20 {titer}
C-ANCA: <1:20 {titer}
P-ANCA: <1:20 {titer}

## 2020-07-31 LAB — ANA: ANA Titer 1: NEGATIVE

## 2020-07-31 LAB — SEDIMENTATION RATE: Sed Rate: 7 mm/hr (ref 0–40)

## 2020-07-31 LAB — ALPHA-1-ANTITRYPSIN: A-1 Antitrypsin: 152 mg/dL (ref 101–187)

## 2020-07-31 LAB — IGE: IgE (Immunoglobulin E), Serum: 34 IU/mL (ref 6–495)

## 2020-07-31 LAB — ANGIOTENSIN CONVERTING ENZYME: Angio Convert Enzyme: 54 U/L (ref 14–82)

## 2020-07-31 LAB — C-REACTIVE PROTEIN: CRP: 13 mg/L — ABNORMAL HIGH (ref 0–10)

## 2020-08-05 ENCOUNTER — Telehealth: Payer: Self-pay | Admitting: Allergy & Immunology

## 2020-08-05 ENCOUNTER — Other Ambulatory Visit: Payer: Self-pay | Admitting: Allergy & Immunology

## 2020-08-05 DIAGNOSIS — J4540 Moderate persistent asthma, uncomplicated: Secondary | ICD-10-CM

## 2020-08-05 MED ORDER — ALBUTEROL SULFATE (2.5 MG/3ML) 0.083% IN NEBU: INHALATION_SOLUTION | RESPIRATORY_TRACT | 1 refills | Status: DC

## 2020-08-05 NOTE — Telephone Encounter (Signed)
Patient called requesting a refill for her nebulizer solution, [facility]. Last seen 07/22/20. She also had labs done about a week ago and she would like to know if the results are in for those.

## 2020-08-05 NOTE — Telephone Encounter (Signed)
Patient returned call. I reset her mychart for her so that she was able to access it. I also let her know that [name] would be in touch regarding which biologic would be best so that she could look up the info. She was agreeable with that.

## 2020-08-05 NOTE — Telephone Encounter (Signed)
Refill sent. I was not able to inform her as she did not answer the phone. Dr. Dellis Anes did send her a mychart message regarding her labs.

## 2020-08-10 ENCOUNTER — Other Ambulatory Visit: Payer: Self-pay | Admitting: Allergy & Immunology

## 2020-08-11 ENCOUNTER — Encounter: Payer: Self-pay | Admitting: Allergy & Immunology

## 2020-08-14 ENCOUNTER — Other Ambulatory Visit: Payer: Self-pay

## 2020-08-14 ENCOUNTER — Ambulatory Visit (INDEPENDENT_AMBULATORY_CARE_PROVIDER_SITE_OTHER): Payer: Medicare Other | Admitting: Allergy & Immunology

## 2020-08-14 DIAGNOSIS — J302 Other seasonal allergic rhinitis: Secondary | ICD-10-CM | POA: Diagnosis not present

## 2020-08-14 DIAGNOSIS — J4541 Moderate persistent asthma with (acute) exacerbation: Secondary | ICD-10-CM

## 2020-08-14 DIAGNOSIS — J3089 Other allergic rhinitis: Secondary | ICD-10-CM

## 2020-08-16 NOTE — Progress Notes (Signed)
RE: [name] MRN: [id number] DOB: 02-18-1961 Date of Telemedicine Visit: 08/14/2020  Referring provider: Raliegh Ip, DO Primary care provider: Raliegh Ip, DO  Chief Complaint: No chief complaint on file.   Telemedicine Follow Up Visit via Telephone: I connected with [name] for a follow up on 08/16/20 by telephone and verified that I am speaking with the correct person using two identifiers.   I discussed the limitations, risks, security and privacy concerns of performing an evaluation and management service by telephone and the availability of in person appointments. I also discussed with the patient that there may be a patient responsible charge related to this service. The patient expressed understanding and agreed to proceed.  Patient is at home.  Provider is at the office.  Visit start time: 8:45 AM Visit end time: 9:05 AM Insurance consent/check in by: [facility] consent and medical assistant/nurse: Dr. Reece Agar  History of Present Illness:  She is a 60 y.o. female, who is being followed for moderate persistent asthma as well as seasonal perennial allergic rhinitis. Her previous allergy office visit was in January 2022 with myself.  At that visit, her lung testing looked terrible once again.  We did get some labs to rule out serious causes of difficult to control asthma.  We obtained a repeat chest x-ray to follow-up on an abnormal chest CT from 2016.  We continue with Symbicort 160/4.5 mcg 2 puffs twice daily as well as albuterol as needed.  For her rhinitis, would continue with Flonase as well as Astelin and cetirizine.  Her previous chest CT from 2016 showed nonspecific scattered patchy mosaic attenuation and groundglass attenuation throughout the lobes of both lung fields.  This was apparently done in the ER for evaluation of bronchitis.  She saw Dr. Sherene Sires on a number of occasions, but his last visit with her was in June 2016.  We did get a chest x-ray at the  last visit which was normal.  In the interim, she has mostly done well.  However, over the last week, she has developed shortness of breath as well as mucus production.  She denies any fevers.  He has been using her rescue inhaler quite a bit more.  She remains on Symbicort 2 puffs twice daily.  She denies sick contacts.  She remains on her nose sprays as well as her cetirizine.  Otherwise, there have been no changes to her past medical history, surgical history, family history, or social history.  Assessment and Plan:  Jaime is a 60 y.o. female with:  Moderate persistent asthma -with acute exacerbation (5th round of prednisone in 12 months)  Seasonal and perennial allergic rhinitis(indoor and outdoor molds, cat, dog, cockroach)  Chronic back pain - with a history of scoliosis (rods placement in 2010) and now on disability  2016 chest CT consistent with bilateral groundglass opacities   We are going to give another round of prednisone today.  She sounds fairly terrible on the phone.  She definitely needs to start a biologic.  We obtained paperwork from [name] which she will need to fill out if this approved on a free basis.  She is going to go ahead and do that.   Diagnostics: None.  Medication List:  Current Outpatient Medications  Medication Sig Dispense Refill  . albuterol (PROVENTIL) (2.5 MG/3ML) 0.083% nebulizer solution NEBULIZE ONE AMPULE EVERY 4 HOURS AS NEEDED FOR COUGH OR WHEEZE - 90 mL 1  . albuterol (VENTOLIN HFA) 108 (90 Base) MCG/ACT  inhaler INHALE TWO PUFFS INTO LUNGS EVERY 4 HOURS AS NEEDED FOR WHEEZING OR SHORTNESS OF BREATH 18 g 1  . ARNUITY ELLIPTA 100 MCG/ACT AEPB Inhale 1 puff into the lungs daily. For 1-2 weeks during signs and symptoms of upper respiratory infection. 30 each 2  . azelastine (ASTELIN) 0.1 % nasal spray INSTILL 2 SPRAYS IN EACH NOSTRIL TWICE DAILY AS NEEDED 30 mL 6  . diclofenac (VOLTAREN) 75 MG EC tablet Take 75 mg by mouth 2 (two) times  daily with a meal.  2  . fluticasone (FLONASE) 50 MCG/ACT nasal spray INSTILL 2 SPRAYS INTO EACH NOSTRIL TWICE DAILY 16 g 4  . ibuprofen (ADVIL) 600 MG tablet Take 1 tablet (600 mg total) by mouth every 8 (eight) hours as needed. 30 tablet 0  . ipratropium-albuterol (DUONEB) 0.5-2.5 (3) MG/3ML SOLN Take 3 mLs by nebulization every 4 (four) hours as needed. 360 mL 0  . levocetirizine (XYZAL) 5 MG tablet TAKE 1 TABLET BY MOUTH EVERY MORNING 90 tablet 0  . montelukast (SINGULAIR) 10 MG tablet TAKE 1 TABLET BY MOUTH AT BEDTIME 30 tablet 3  . morphine (MSIR) 15 MG tablet Take 15 mg by mouth every 8 (eight) hours as needed. Back Pain    . pantoprazole (PROTONIX) 40 MG tablet TAKE 1 TABLET BY MOUTH DAILY 90 tablet 1  . SYMBICORT 160-4.5 MCG/ACT inhaler INHALE TWO PUFFS INTO THE LUNGS TWICE DAILY 10.2 g 5  . tiZANidine (ZANAFLEX) 4 MG tablet Take 4 mg by mouth 2 (two) times daily as needed.     No current facility-administered medications for this visit.   Allergies: No Known Allergies I reviewed her past medical history, social history, family history, and environmental history and no significant changes have been reported from previous visits.  Review of Systems  Constitutional: Negative for activity change and appetite change.  HENT: Negative for congestion, postnasal drip, rhinorrhea, sinus pressure and sore throat.   Eyes: Negative for pain, discharge, redness and itching.  Respiratory: Positive for chest tightness, shortness of breath and wheezing. Negative for stridor.   Gastrointestinal: Negative for diarrhea, nausea and vomiting.  Musculoskeletal: Negative for arthralgias, joint swelling and myalgias.  Skin: Negative for rash.  Allergic/Immunologic: Negative for environmental allergies and food allergies.    Objective:  Physical exam not obtained as encounter was done via telephone.   Previous notes and tests were reviewed.  I discussed the assessment and treatment plan with the  patient. The patient was provided an opportunity to ask questions and all were answered. The patient agreed with the plan and demonstrated an understanding of the instructions.   The patient was advised to call back or seek an in-person evaluation if the symptoms worsen or if the condition fails to improve as anticipated.  I provided 20 minutes of non-face-to-face time during this encounter.  It was my pleasure to participate in [name]'s care today. Please feel free to contact me with any questions or concerns.   Sincerely,  Alfonse Spruce, MD

## 2020-08-17 ENCOUNTER — Encounter: Payer: Self-pay | Admitting: Allergy & Immunology

## 2020-08-18 ENCOUNTER — Other Ambulatory Visit: Payer: Self-pay | Admitting: Allergy & Immunology

## 2020-08-21 ENCOUNTER — Other Ambulatory Visit: Payer: Self-pay

## 2020-08-21 ENCOUNTER — Encounter: Payer: Self-pay | Admitting: Family Medicine

## 2020-08-21 ENCOUNTER — Ambulatory Visit (INDEPENDENT_AMBULATORY_CARE_PROVIDER_SITE_OTHER): Payer: Medicare Other | Admitting: Family Medicine

## 2020-08-21 VITALS — BP 132/79 | HR 60 | Temp 96.9°F | Ht 67.0 in | Wt 174.0 lb

## 2020-08-21 DIAGNOSIS — E782 Mixed hyperlipidemia: Secondary | ICD-10-CM

## 2020-08-21 DIAGNOSIS — Z1231 Encounter for screening mammogram for malignant neoplasm of breast: Secondary | ICD-10-CM

## 2020-08-21 DIAGNOSIS — Z0000 Encounter for general adult medical examination without abnormal findings: Secondary | ICD-10-CM

## 2020-08-21 DIAGNOSIS — Z1211 Encounter for screening for malignant neoplasm of colon: Secondary | ICD-10-CM

## 2020-08-21 DIAGNOSIS — Z8249 Family history of ischemic heart disease and other diseases of the circulatory system: Secondary | ICD-10-CM | POA: Diagnosis not present

## 2020-08-21 DIAGNOSIS — F410 Panic disorder [episodic paroxysmal anxiety] without agoraphobia: Secondary | ICD-10-CM | POA: Diagnosis not present

## 2020-08-21 DIAGNOSIS — J8281 Chronic eosinophilic pneumonia: Secondary | ICD-10-CM

## 2020-08-21 MED ORDER — HYDROXYZINE HCL 50 MG PO TABS: 25.0000 mg | ORAL_TABLET | Freq: Three times a day (TID) | ORAL | 0 refills | Status: DC | PRN

## 2020-08-21 NOTE — Patient Instructions (Signed)
You had labs performed today.  You will be contacted with the results of the labs once they are available, usually in the next 3 business days for routine lab work.  If you have an active my chart account, they will be released to your MyChart.  If you prefer to have these labs released to you via telephone, please let us know.  If you had a pap smear or biopsy performed, expect to be contacted in about 7-10 days.  Preventive Care 40-58 Years Old, Female Preventive care refers to lifestyle choices and visits with your health care provider that can promote health and wellness. This includes:  A yearly physical exam. This is also called an annual wellness visit.  Regular dental and eye exams.  Immunizations.  Screening for certain conditions.  Healthy lifestyle choices, such as: ? Eating a healthy diet. ? Getting regular exercise. ? Not using drugs or products that contain nicotine and tobacco. ? Limiting alcohol use. What can I expect for my preventive care visit? Physical exam Your health care provider will check your:  Height and weight. These may be used to calculate your BMI (body mass index). BMI is a measurement that tells if you are at a healthy weight.  Heart rate and blood pressure.  Body temperature.  Skin for abnormal spots. Counseling Your health care provider may ask you questions about your:  Past medical problems.  Family's medical history.  Alcohol, tobacco, and drug use.  Emotional well-being.  Home life and relationship well-being.  Sexual activity.  Diet, exercise, and sleep habits.  Work and work environment.  Access to firearms.  Method of birth control.  Menstrual cycle.  Pregnancy history. What immunizations do I need? Vaccines are usually given at various ages, according to a schedule. Your health care provider will recommend vaccines for you based on your age, medical history, and lifestyle or other factors, such as travel or where you  work.   What tests do I need? Blood tests  Lipid and cholesterol levels. These may be checked every 5 years, or more often if you are over 50 years old.  Hepatitis C test.  Hepatitis B test. Screening  Lung cancer screening. You may have this screening every year starting at age 55 if you have a 30-pack-year history of smoking and currently smoke or have quit within the past 15 years.  Colorectal cancer screening. ? All adults should have this screening starting at age 50 and continuing until age 75. ? Your health care provider may recommend screening at age 45 if you are at increased risk. ? You will have tests every 1-10 years, depending on your results and the type of screening test.  Diabetes screening. ? This is done by checking your blood sugar (glucose) after you have not eaten for a while (fasting). ? You may have this done every 1-3 years.  Mammogram. ? This may be done every 1-2 years. ? Talk with your health care provider about when you should start having regular mammograms. This may depend on whether you have a family history of breast cancer.  BRCA-related cancer screening. This may be done if you have a family history of breast, ovarian, tubal, or peritoneal cancers.  Pelvic exam and Pap test. ? This may be done every 3 years starting at age 21. ? Starting at age 30, this may be done every 5 years if you have a Pap test in combination with an HPV test. Other tests  STD (sexually transmitted   disease) testing, if you are at risk.  Bone density scan. This is done to screen for osteoporosis. You may have this scan if you are at high risk for osteoporosis. Talk with your health care provider about your test results, treatment options, and if necessary, the need for more tests. Follow these instructions at home: Eating and drinking  Eat a diet that includes fresh fruits and vegetables, whole grains, lean protein, and low-fat dairy products.  Take vitamin and mineral  supplements as recommended by your health care provider.  Do not drink alcohol if: ? Your health care provider tells you not to drink. ? You are pregnant, may be pregnant, or are planning to become pregnant.  If you drink alcohol: ? Limit how much you have to 0-1 drink a day. ? Be aware of how much alcohol is in your drink. In the [place] one drink equals one 12 oz bottle of beer (355 mL), one 5 oz glass of wine (148 mL), or one 1 oz glass of hard liquor (44 mL).   Lifestyle  Take daily care of your teeth and gums. Brush your teeth every morning and night with fluoride toothpaste. Floss one time each day.  Stay active. Exercise for at least 30 minutes 5 or more days each week.  Do not use any products that contain nicotine or tobacco, such as cigarettes, e-cigarettes, and chewing tobacco. If you need help quitting, ask your health care provider.  Do not use drugs.  If you are sexually active, practice safe sex. Use a condom or other form of protection to prevent STIs (sexually transmitted infections).  If you do not wish to become pregnant, use a form of birth control. If you plan to become pregnant, see your health care provider for a prepregnancy visit.  If told by your health care provider, take low-dose aspirin daily starting at age 50.  Find healthy ways to cope with stress, such as: ? Meditation, yoga, or listening to music. ? Journaling. ? Talking to a trusted person. ? Spending time with friends and family. Safety  Always wear your seat belt while driving or riding in a vehicle.  Do not drive: ? If you have been drinking alcohol. Do not ride with someone who has been drinking. ? When you are tired or distracted. ? While texting.  Wear a helmet and other protective equipment during sports activities.  If you have firearms in your house, make sure you follow all gun safety procedures. What's next?  Visit your health care provider once a year for an annual wellness  visit.  Ask your health care provider how often you should have your eyes and teeth checked.  Stay up to date on all vaccines. This information is not intended to replace advice given to you by your health care provider. Make sure you discuss any questions you have with your health care provider. Document Revised: 03/17/2020 Document Reviewed: 02/22/2018 Elsevier Patient Education  2021 Elsevier Inc.  

## 2020-08-21 NOTE — Progress Notes (Signed)
Subjective: CC: Annual physical PCP: Janora Norlander, DO EQA:STMHD W Kimm is a 60 y.o. female presenting to clinic today for:  1.  Eosinophilia Patient reports that she recently saw her asthma and allergy MD and that there is plan for injectable biologic.  She is not yet started the medication and is not sure how long it will take to work but they are pretty convinced that this is what is causing her symptoms as her lung work-up was otherwise unremarkable.  2.  Panic attack Patient reports that she recently had what she thinks is a panic attack where she felt like her throat closed up, she was flushing and found it hard to breathe.  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  When she sat down in her car for a little while it resolved.  She did feel that it was severe enough that she considered ER eval.   ROS: Per HPI  No Known Allergies Past Medical History:  Diagnosis Date  . Arthritis    Spine, R knee  . Asthma   . Back pain   . Cough 09/03/2014   Followed in Pulmonary clinic/ [facility]/ [place] - sinus CT 09/08/2014> Mild mucosal thickening involves the paranasal sinuses  - Eos 1.2  08/06/14 - Allergy profile 11/27/14 >  Eos 1.0/ IgE  41 with neg RAST    . GERD (gastroesophageal reflux disease)   . History of blood transfusion    Back surgery    Current Outpatient Medications:  .  albuterol (PROVENTIL) (2.5 MG/3ML) 0.083% nebulizer solution, NEBULIZE ONE AMPULE EVERY 4 HOURS AS NEEDED FOR COUGH OR WHEEZE -, Disp: 90 mL, Rfl: 1 .  albuterol (VENTOLIN HFA) 108 (90 Base) MCG/ACT inhaler, INHALE TWO PUFFS INTO LUNGS EVERY 4 HOURS AS NEEDED FOR WHEEZING OR SHORTNESS OF BREATH, Disp: 18 g, Rfl: 1 .  ARNUITY ELLIPTA 100 MCG/ACT AEPB, Inhale 1 puff into the lungs daily. For 1-2 weeks during signs and symptoms of upper respiratory infection., Disp: 30 each, Rfl: 2 .  azelastine (ASTELIN) 0.1 % nasal spray, INSTILL 2 SPRAYS IN EACH NOSTRIL TWICE DAILY AS NEEDED, Disp: 30  mL, Rfl: 6 .  diclofenac (VOLTAREN) 75 MG EC tablet, Take 75 mg by mouth 2 (two) times daily with a meal., Disp: , Rfl: 2 .  fluticasone (FLONASE) 50 MCG/ACT nasal spray, INSTILL 2 SPRAYS INTO EACH NOSTRIL TWICE DAILY, Disp: 16 g, Rfl: 4 .  ibuprofen (ADVIL) 600 MG tablet, Take 1 tablet (600 mg total) by mouth every 8 (eight) hours as needed., Disp: 30 tablet, Rfl: 0 .  ipratropium-albuterol (DUONEB) 0.5-2.5 (3) MG/3ML SOLN, Take 3 mLs by nebulization every 4 (four) hours as needed., Disp: 360 mL, Rfl: 0 .  levocetirizine (XYZAL) 5 MG tablet, TAKE 1 TABLET BY MOUTH EVERY MORNING, Disp: 90 tablet, Rfl: 0 .  montelukast (SINGULAIR) 10 MG tablet, TAKE 1 TABLET BY MOUTH AT BEDTIME, Disp: 30 tablet, Rfl: 3 .  morphine (MSIR) 15 MG tablet, Take 15 mg by mouth every 8 (eight) hours as needed. Back Pain, Disp: , Rfl:  .  pantoprazole (PROTONIX) 40 MG tablet, TAKE 1 TABLET BY MOUTH DAILY, Disp: 30 tablet, Rfl: 1 .  SYMBICORT 160-4.5 MCG/ACT inhaler, INHALE TWO PUFFS INTO THE LUNGS TWICE DAILY, Disp: 10.2 g, Rfl: 5 .  tiZANidine (ZANAFLEX) 4 MG tablet, Take 4 mg by mouth 2 (two) times daily as needed., Disp: , Rfl:  Social History   Socioeconomic History  . Marital status: Significant  Other    Spouse name: [name]  . Number of children: 2  . Years of education: 14  . Highest education level: Not on file  Occupational History  . Occupation: disabled    Comment: back  Tobacco Use  . Smoking status: Never Smoker  . Smokeless tobacco: Never Used  Vaping Use  . Vaping Use: Never used  Substance and Sexual Activity  . Alcohol use: No  . Drug use: No  . Sexual activity: Yes    Birth control/protection: Surgical  Other Topics Concern  . Not on file  Social History Narrative   Lives with [name] - 78 years   [name] has end stage colon cancer   Two sons/ grandchildren   Disabled from back pain/had scoliosis. Has had back reconstruction.    Dr. Hulda Marin doctor.    Social Determinants of Health    Financial Resource Strain: Not on file  Food Insecurity: Not on file  Transportation Needs: Not on file  Physical Activity: Not on file  Stress: Not on file  Social Connections: Not on file  Intimate Partner Violence: Not on file   Family History  Problem Relation Age of Onset  . Emphysema Maternal Grandmother        never smoker  . COPD Maternal Grandmother   . Emphysema Maternal Uncle        smoked  . COPD Maternal Uncle   . Arthritis Mother   . Diabetes Mother   . Hyperlipidemia Mother   . Hypertension Mother   . Early death Father 70       MVA  . Asthma Sister   . Asthma Brother   . Heart disease Maternal Grandfather     Objective: Office vital signs reviewed. BP 132/79   Pulse 60   Temp (!) 96.9 F (36.1 C) (Temporal)   Ht '5\' 7"'  (1.702 m)   Wt 174 lb (78.9 kg)   LMP 08/30/2002 (Approximate)   SpO2 98%   BMI 27.25 kg/m   Physical Examination:  General: Awake, alert, well nourished, No acute distress HEENT: Normal    Neck: No masses palpated. No lymphadenopathy    Ears: Tympanic membranes intact, normal light reflex, no erythema, no bulging    Eyes: PERRLA, extraocular membranes intact, sclera white    Nose: nasal turbinates moist, clear nasal discharge; small hemostatic bleed noted along the right nare    Throat: moist mucus membranes, no erythema, mp tonsillar exudate.  Airway is patent Cardio: regular rate and rhythm, S1S2 heard, no murmurs appreciated Pulm: clear to auscultation bilaterally, no wheezes, rhonchi or rales; normal work of breathing on room air GI: soft, non-tender, non-distended, bowel sounds present x4, no hepatomegaly, no splenomegaly, no masses Extremities: warm, well perfused, No edema, cyanosis or clubbing; +2 pulses bilaterally MSK: normal gait and station Skin: dry; intact; no rashes or lesions Neuro:  patellar DTRs 2/4 Psych: Mood stable, speech normal, affect appropriate  Assessment/ Plan: 60 y.o. female   Annual physical  exam  Mixed hyperlipidemia - Plan: CMP14+EGFR, Lipid Panel, TSH  Family history of early CAD - Plan: CMP14+EGFR, Lipid Panel, TSH  Panic attack - Plan: hydrOXYzine (ATARAX/VISTARIL) 50 MG tablet  Pulmonary infiltrates with eosinophilia (HCC)  Encounter for screening mammogram for malignant neoplasm of breast - Plan: MM Digital Screening  Screening for colon cancer - Plan: Cologuard  Check fasting lipid, CMP  Trial of hydroxyzine for panic attack.  Caution sedation  Continue to follow-up with asthma/allergy for eosinophilia.  Plan for injectable  biologic soon  She will get her mammogram and Cologuard completed  Orders Placed This Encounter  Procedures  . CMP14+EGFR  . Lipid Panel  . TSH   No orders of the defined types were placed in this encounter.    Janora Norlander, DO [place] [phone]

## 2020-08-22 LAB — CMP14+EGFR
ALT: 24 IU/L (ref 0–32)
AST: 21 IU/L (ref 0–40)
Albumin/Globulin Ratio: 1.9 (ref 1.2–2.2)
Albumin: 4.4 g/dL (ref 3.8–4.9)
Alkaline Phosphatase: 117 IU/L (ref 44–121)
BUN/Creatinine Ratio: 22 (ref 9–23)
BUN: 17 mg/dL (ref 6–24)
Bilirubin Total: 0.3 mg/dL (ref 0.0–1.2)
CO2: 24 mmol/L (ref 20–29)
Calcium: 9.8 mg/dL (ref 8.7–10.2)
Chloride: 100 mmol/L (ref 96–106)
Creatinine, Ser: 0.79 mg/dL (ref 0.57–1.00)
GFR calc Af Amer: 96 mL/min/{1.73_m2} (ref >59)
GFR calc non Af Amer: 83 mL/min/{1.73_m2} (ref >59)
Globulin, Total: 2.3 g/dL (ref 1.5–4.5)
Glucose: 93 mg/dL (ref 65–99)
Potassium: 4.4 mmol/L (ref 3.5–5.2)
Sodium: 141 mmol/L (ref 134–144)
Total Protein: 6.7 g/dL (ref 6.0–8.5)

## 2020-08-22 LAB — LIPID PANEL
Chol/HDL Ratio: 4.9 ratio — ABNORMAL HIGH (ref 0.0–4.4)
Cholesterol, Total: 202 mg/dL — ABNORMAL HIGH (ref 100–199)
HDL: 41 mg/dL (ref >39)
LDL Chol Calc (NIH): 102 mg/dL — ABNORMAL HIGH (ref 0–99)
Triglycerides: 346 mg/dL — ABNORMAL HIGH (ref 0–149)
VLDL Cholesterol Cal: 59 mg/dL — ABNORMAL HIGH (ref 5–40)

## 2020-08-22 LAB — TSH: TSH: 1.8 u[IU]/mL (ref 0.450–4.500)

## 2020-08-24 ENCOUNTER — Encounter: Payer: Self-pay | Admitting: Family Medicine

## 2020-08-25 ENCOUNTER — Other Ambulatory Visit: Payer: Self-pay | Admitting: Family Medicine

## 2020-08-25 DIAGNOSIS — E782 Mixed hyperlipidemia: Secondary | ICD-10-CM

## 2020-08-25 DIAGNOSIS — Z8249 Family history of ischemic heart disease and other diseases of the circulatory system: Secondary | ICD-10-CM

## 2020-08-29 ENCOUNTER — Other Ambulatory Visit: Payer: Self-pay | Admitting: Allergy & Immunology

## 2020-08-31 NOTE — Telephone Encounter (Signed)
Please advise to refill as this medication was not listed on last avs

## 2020-09-02 ENCOUNTER — Other Ambulatory Visit: Payer: Self-pay

## 2020-09-02 ENCOUNTER — Ambulatory Visit (INDEPENDENT_AMBULATORY_CARE_PROVIDER_SITE_OTHER): Payer: Medicare Other | Admitting: Allergy & Immunology

## 2020-09-02 ENCOUNTER — Encounter: Payer: Self-pay | Admitting: Allergy & Immunology

## 2020-09-02 VITALS — BP 122/72 | HR 60 | Temp 97.3°F | Resp 16

## 2020-09-02 DIAGNOSIS — J3089 Other allergic rhinitis: Secondary | ICD-10-CM

## 2020-09-02 DIAGNOSIS — J302 Other seasonal allergic rhinitis: Secondary | ICD-10-CM

## 2020-09-02 DIAGNOSIS — R918 Other nonspecific abnormal finding of lung field: Secondary | ICD-10-CM

## 2020-09-02 DIAGNOSIS — J4540 Moderate persistent asthma, uncomplicated: Secondary | ICD-10-CM

## 2020-09-02 DIAGNOSIS — R0602 Shortness of breath: Secondary | ICD-10-CM | POA: Diagnosis not present

## 2020-09-02 DIAGNOSIS — J4550 Severe persistent asthma, uncomplicated: Secondary | ICD-10-CM | POA: Diagnosis not present

## 2020-09-02 MED ORDER — BENRALIZUMAB 30 MG/ML ~~LOC~~ SOSY
30.0000 mg | PREFILLED_SYRINGE | Freq: Once | SUBCUTANEOUS | Status: AC
Start: 2020-09-02 — End: 2020-09-02
  Administered 2020-09-02: 30 mg via SUBCUTANEOUS

## 2020-09-02 MED ORDER — EPINEPHRINE 0.3 MG/0.3ML IJ SOAJ: 0.3000 mg | Freq: Once | INTRAMUSCULAR | 1 refills | Status: AC

## 2020-09-02 NOTE — Progress Notes (Signed)
FOLLOW UP  Date of Service/Encounter:  09/02/20   Assessment:   Moderate persistent asthma - starting Faserna today  Seasonal and perennial allergic rhinitis(indoor and outdoor molds, cat, dog, cockroach)  Chronic back pain - with a history of scoliosis (rods placement in 2010) and now on disability  History of ground glass opacities in 2016 - ordering repeat chest CT here  Plan/Recommendations:   1. Moderate persistent asthma, uncomplicated - Lung testing looked bad again. Harrington Challenger sample given today. - [name] says she has all of the paperwork for your approval.  - We are going to order another chest CT to follow up on those ground glass opacities.  - Daily controller medication(s): Symbicort 160/4.15mcg two puffs twice daily with spacer - Prior to physical activity: ProAir 2 puffs 10-15 minutes before physical activity. - Rescue medications: ProAir 4 puffs every 4-6 hours as needed or albuterol nebulizer one vial every 4-6 hours as needed - Changes during respiratory infections or worsening symptoms: Add on Asmanex 2 puffs twice daily for ONE TO TWO WEEKS. - Asthma control goals:  * Full participation in all desired activities (may need albuterol before activity) * Albuterol use two time or less a week on average (not counting use with activity) * Cough interfering with sleep two time or less a month * Oral steroids no more than once a year * No hospitalizations  2. Chronic rhinitis (weeds, indoor molds, outdoor molds, cat, dog, and cockroach) - Continue with fluticasone nasal spray 1-2 sprays per nostril daily. - Continue with Astelin 2 sprays per nostril up to twice daily as needed.  - Continue with cetirizine 10mg  daily.  3. Return in about 4 weeks (around 09/30/2020).   Subjective:   [name] is a 60 y.o. female presenting today for follow up of  Chief Complaint  Patient presents with  . Follow-up    FIZZA SCALES has a history of the  following: Patient Active Problem List   Diagnosis Date Noted  . Varicose veins of bilateral lower extremities with pain 01/02/2020  . Seasonal and perennial allergic rhinitis 04/15/2018  . 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 Chronic insomnia 11/29/2016  . Eosinophilia 03/29/2016  . Moderate persistent asthma, uncomplicated 03/29/2016  . 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 Other social stressor 03/29/2016  . 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 Arthritis of knee, degenerative 07/09/2015  . Chronic low back pain 04/15/2015  . Cough variant asthma 08/09/2014  . Pulmonary infiltrates with eosinophilia (HCC) 08/09/2014    History obtained from: chart review and patient.  [name] is a 60 y.o. female presenting for a follow up visit.  She was last seen in February 2022.  At that time, we talked her over the phone and she was experiencing another asthma exacerbation.  We started her on her fifth round of prednisone in 12 months.  We again discussed starting a biologic.  This is been an ongoing conversation with her for the last 3-1/2 years.  For her allergic rhinitis, we continue with all of her medications.  Since last visit, she has done well. She is actually feeling good today.  She does report that she is starting to feel bad, by which she means chest tightness.  She has had no fevers.  She is not sure what is triggering all of the episodes.  She has not been using her rescue inhaler, but remains compliant with her Symbicort.  She is very  much up to starting at the entrance today and filled out the paperwork.  I talked to [name] and she did need her Part D of her Medicare card, but we founded it in her chart.  Chest CT (2016)   IMPRESSION: Nonspecific scattered patchy mosaic attenuation/ground-glass attenuation airspace disease throughout all lobes of both lungs with slight upper lung predominance to the overall pattern. Differential  considerations are broad and include post infectious bronchiolitis, toxin inhalation, hypersensitivity pneumonitis, and rheumatic interstitial lung disease as can be seen in the setting of rheumatoid arthritis and Sjogren's syndrome.  Otherwise, there have been no changes to her past medical history, surgical history, family history, or social history.    Review of Systems  Constitutional: Negative.  Negative for fever, malaise/fatigue and weight loss.  HENT: Negative.  Negative for congestion, ear discharge and ear pain.   Eyes: Negative for pain, discharge and redness.  Respiratory: Positive for wheezing. Negative for cough, sputum production and shortness of breath.   Cardiovascular: Negative.  Negative for chest pain and palpitations.  Gastrointestinal: Negative for abdominal pain and heartburn.  Skin: Negative.  Negative for itching and rash.  Neurological: Negative for dizziness and headaches.  Endo/Heme/Allergies: Negative for environmental allergies. Does not bruise/bleed easily.       Objective:   Blood pressure 122/72, pulse 60, temperature (!) 97.3 F (36.3 C), temperature source Temporal, resp. rate 16, last menstrual period 08/30/2002, SpO2 94 %. There is no height or weight on file to calculate BMI.   Physical Exam:  Physical Exam Vitals reviewed.  Constitutional:      Appearance: She is well-developed.     Comments: Smiling.  HENT:     Head: Normocephalic and atraumatic.     Right Ear: Tympanic membrane, ear canal and external ear normal.     Left Ear: Tympanic membrane, ear canal and external ear normal.     Nose: No nasal deformity, septal deviation, mucosal edema, rhinorrhea or epistaxis.     Right Turbinates: Enlarged and swollen.     Left Turbinates: Enlarged and swollen.     Right Sinus: No maxillary sinus tenderness or frontal sinus tenderness.     Left Sinus: No maxillary sinus tenderness or frontal sinus tenderness.     Mouth/Throat:     Lips: Pink.      Mouth: Oropharynx is clear and moist. Mucous membranes are moist. Mucous membranes are not pale and not dry.     Pharynx: Uvula midline.     Comments: Some cobblestoning in the posterior oropharynx.  Eyes:     General:        Right eye: No discharge.        Left eye: No discharge.     Extraocular Movements: EOM normal.     Conjunctiva/sclera: Conjunctivae normal.     Right eye: Right conjunctiva is not injected. No chemosis.    Left eye: Left conjunctiva is not injected. No chemosis.    Pupils: Pupils are equal, round, and reactive to light.  Cardiovascular:     Rate and Rhythm: Normal rate and regular rhythm.     Heart sounds: Normal heart sounds.  Pulmonary:     Effort: Pulmonary effort is normal. No tachypnea, accessory muscle usage or respiratory distress.     Breath sounds: Normal breath sounds. No wheezing, rhonchi or rales.     Comments: End expiratory wheezing throughout.  Chest:     Chest wall: No tenderness.  Lymphadenopathy:     Cervical: No cervical  adenopathy.  Skin:    General: Skin is warm.     Capillary Refill: Capillary refill takes less than 2 seconds.     Coloration: Skin is not pale.     Findings: No abrasion, erythema, petechiae or rash. Rash is not papular, urticarial or vesicular.  Neurological:     Mental Status: She is alert.  Psychiatric:        Mood and Affect: Mood and affect normal.        Behavior: Behavior is cooperative.      Diagnostic studies: none   She did receive her Fasenra sample in there office. She tolerated this without a problem. She was monitored for 30 minutes after the injection.      Malachi Bonds, MD  [facility]

## 2020-09-02 NOTE — Patient Instructions (Addendum)
1. Moderate persistent asthma, uncomplicated - Lung testing looked bad again. Harrington Challenger sample given today. - [name] says she has all of the paperwork for your approval.  - We are going to order another chest CT to follow up on those ground glass opacities.  - Daily controller medication(s): Symbicort 160/4.40mcg two puffs twice daily with spacer - Prior to physical activity: ProAir 2 puffs 10-15 minutes before physical activity. - Rescue medications: ProAir 4 puffs every 4-6 hours as needed or albuterol nebulizer one vial every 4-6 hours as needed - Changes during respiratory infections or worsening symptoms: Add on Asmanex 2 puffs twice daily for ONE TO TWO WEEKS. - Asthma control goals:  * Full participation in all desired activities (may need albuterol before activity) * Albuterol use two time or less a week on average (not counting use with activity) * Cough interfering with sleep two time or less a month * Oral steroids no more than once a year * No hospitalizations  2. Chronic rhinitis (weeds, indoor molds, outdoor molds, cat, dog, and cockroach) - Continue with fluticasone nasal spray 1-2 sprays per nostril daily. - Continue with Astelin 2 sprays per nostril up to twice daily as needed.  - Continue with cetirizine 10mg  daily.  3. Return in about 4 weeks (around 09/30/2020).    Please inform 11/30/2020 of any Emergency Department visits, hospitalizations, or changes in symptoms. Call us before going to the ED for breathing or allergy symptoms since we might be able to fit you in for a sick visit. Feel free to contact us anytime with any questions, problems, or concerns.  It was a pleasure to see you again today!  Websites that have reliable patient information: 1. American Academy of Asthma, Allergy, and Immunology: www.aaaai.org 2. Food Allergy Research and Education (FARE): foodallergy.org 3. Mothers of Asthmatics: http://www.asthmacommunitynetwork.org 4. American College of  Allergy, Asthma, and Immunology: www.acaai.org   COVID-19 Vaccine Information can be found at: [place] For questions related to vaccine distribution or appointments, please email [email or web address] or call [phone].   We realize that you might be concerned about having an allergic reaction to the COVID19 vaccines. To help with that concern, WE ARE OFFERING THE COVID19 VACCINES IN OUR OFFICE! Ask the front desk for dates!     "Like" [phone] on Facebook and Instagram for our latest updates!      A healthy democracy works best when [place] participate! Make sure you are registered to vote! If you have moved or changed any of your contact information, you will need to get this updated before voting!  In some cases, you MAY be able to register to vote online: [employer]

## 2020-09-02 NOTE — Progress Notes (Signed)
Immunotherapy   Patient Details  Name: CONSETTA COSNER MRN: [id number] Date of Birth: 1961-04-26  09/02/2020  [name] started injections for  Fasenra  Frequency: Every 4 weeks x 3 injections, then every 8 weeks.   Epi-Pen:Epi-Pen Available  Consent signed and patient instructions given.  Patient started Harrington Challenger today and received a sample dose in the LUA. Patinet waited 30 minutes and did not experience any issues.    Mackson Botz Fernandez-Vernon 09/02/2020, 12:07 PM

## 2020-09-03 ENCOUNTER — Telehealth: Payer: Self-pay | Admitting: *Deleted

## 2020-09-03 NOTE — Telephone Encounter (Signed)
Called patient to advise approval for Fasenra from [facility] for free drug and shipment coming form next injection and appt made

## 2020-09-04 NOTE — Telephone Encounter (Signed)
Excellent.  Thanks for the update.  Malachi Bonds, MD [facility]

## 2020-09-10 ENCOUNTER — Encounter: Payer: Medicare Other | Attending: Family Medicine | Admitting: Nutrition

## 2020-09-10 ENCOUNTER — Other Ambulatory Visit: Payer: Self-pay

## 2020-09-10 ENCOUNTER — Encounter: Payer: Self-pay | Admitting: Nutrition

## 2020-09-10 VITALS — Ht 67.0 in | Wt 177.0 lb

## 2020-09-10 DIAGNOSIS — E782 Mixed hyperlipidemia: Secondary | ICD-10-CM | POA: Insufficient documentation

## 2020-09-10 DIAGNOSIS — E781 Pure hyperglyceridemia: Secondary | ICD-10-CM | POA: Insufficient documentation

## 2020-09-10 NOTE — Progress Notes (Signed)
Medical Nutrition Therapy  Appointment Start time:  0800  Appointment End time:  0900  Primary concerns today: Hyperlipidemia Referral diagnosis: E78.0 Preferred learning style:  no preference indicated Learning readiness: change in progress   NUTRITION ASSESSMENT  Has been working on cutting out sweets, desserts. Admits to craving and eating a lot of salty foods and sweets. Wants to get her cholesterol down.  Family history of heart disease.  Anthropometrics  Wt Readings from Last 3 Encounters:  09/10/20 177 lb (80.3 kg)  08/21/20 174 lb (78.9 kg)  02/21/20 176 lb (79.8 kg)   Ht Readings from Last 3 Encounters:  09/10/20 5\' 7"  (1.702 m)  08/21/20 5\' 7"  (1.702 m)  05/20/20 5\' 7"  (1.702 m)   Body mass index is 27.72 kg/m. @BMIFA @ Facility age limit for growth percentiles is 20 years. Facility age limit for growth percentiles is 20 years.  Clinical Medical Hx: Hyperlipidemia, has rods down her back, back pains Medications: see list Labs:  Lipid Panel     Component Value Date/Time   CHOL 202 (H) 08/21/2020 0948   TRIG 346 (H) 08/21/2020 0948   HDL 41 08/21/2020 0948   CHOLHDL 4.9 (H) 08/21/2020 0948   CHOLHDL 4.2 12/14/2017 0905   VLDL 65 (H) 11/29/2016 1112   LDLCALC 102 (H) 08/21/2020 0948   LDLCALC 98 12/14/2017 0905   LABVLDL 59 (H) 08/21/2020 0948    Notable Signs/Symptoms: Cravings for salty and sweet foods  Lifestyle & Dietary Hx Admits to not eating many planned meals, but just eats what she feels like when she is hungry. Doesn't cook a lot at home. Eats out often. Snacks often and doesn't eat balanced meals much. Skips meals.    Has COPD and breathing issues. Gets SOB at times and didn't realize the effect of concentrated sweets on her breathing issues.  Estimated daily fluid intake: 24 oz Supplements:  Sleep: 7/8 hrs Stress / self-care: COPD and cravings  Current average weekly physical activity: not much  24-Hr Dietary Recall Eats randomly. Eats a  lot of sweets, sodas and salty snack food and fast foods.  Estimated Energy Needs Calories: 1800  Carbohydrate: 200g Protein: 150g Fat: 56g   NUTRITION DIAGNOSIS  NI-5.6.2 Excessive fat intake As related to Hyperlipidemia.  As evidenced by TCHOL 202 and LABVLDL 59 and TG 246.   NUTRITION INTERVENTION  Nutrition education (E-1) on the following topics:  . Low Fat Low Salt High Fiber diet . Meal PLanning, portion control Cardiac health. Handouts Provided Include   Low Cholesterol and low TG diet  My Plate  Meal Plan Card   Learning Style & Readiness for Change Teaching method utilized: Visual & Auditory  Demonstrated degree of understanding via: Teach Back  Barriers to learning/adherence to lifestyle change: none  Goals Established by Pt Goals  Cut out sweets, desserts, candy Cut down on salt Use Mrs 01/29/2017 and other herbs and spices Choose high fiber foods.-25 grams of fiber per day. Drink 100 oz of water   MONITORING & EVALUATION Dietary intake, weekly physical activity, in 1 month.  Next Steps  Patient is to keep food journal.

## 2020-09-10 NOTE — Patient Instructions (Signed)
Goals  Cut out sweets, desserts, candy Cut down on salt Use Mrs Sharilyn Sites and other herbs and spices Choose high fiber foods.-25 grams of fiber per day. Drink 100 oz of water

## 2020-09-11 ENCOUNTER — Ambulatory Visit: Payer: Medicare Other | Admitting: Dietician

## 2020-09-15 ENCOUNTER — Encounter: Payer: Self-pay | Admitting: Nutrition

## 2020-09-21 ENCOUNTER — Other Ambulatory Visit: Payer: Self-pay | Admitting: *Deleted

## 2020-09-21 DIAGNOSIS — F410 Panic disorder [episodic paroxysmal anxiety] without agoraphobia: Secondary | ICD-10-CM

## 2020-09-21 MED ORDER — HYDROXYZINE HCL 50 MG PO TABS: 25.0000 mg | ORAL_TABLET | Freq: Three times a day (TID) | ORAL | 1 refills | Status: DC | PRN

## 2020-09-25 ENCOUNTER — Encounter: Payer: Self-pay | Admitting: Family Medicine

## 2020-09-28 ENCOUNTER — Ambulatory Visit (INDEPENDENT_AMBULATORY_CARE_PROVIDER_SITE_OTHER): Payer: Self-pay

## 2020-09-28 ENCOUNTER — Other Ambulatory Visit: Payer: Self-pay

## 2020-09-28 DIAGNOSIS — I83813 Varicose veins of bilateral lower extremities with pain: Secondary | ICD-10-CM

## 2020-09-28 NOTE — Progress Notes (Signed)
Treated pt's spider and small reticular veins with Asclera 1% administered with a 27g butterfly.  Patient received a total of 3 mL. Tolerated well. Anticipate good results. She will need further treatments and we discussed this and pt verbalized understanding. Post procedure care instructions were given both verbally and on handout. Will follow PRN.  Photos: Yes.    Compression stockings applied: Yes.

## 2020-09-30 ENCOUNTER — Ambulatory Visit: Payer: Medicare Other

## 2020-09-30 ENCOUNTER — Encounter: Payer: Self-pay | Admitting: Allergy & Immunology

## 2020-09-30 ENCOUNTER — Other Ambulatory Visit: Payer: Self-pay

## 2020-09-30 ENCOUNTER — Ambulatory Visit (INDEPENDENT_AMBULATORY_CARE_PROVIDER_SITE_OTHER): Payer: Medicare Other | Admitting: Allergy & Immunology

## 2020-09-30 ENCOUNTER — Telehealth: Payer: Self-pay

## 2020-09-30 VITALS — BP 122/80 | HR 60 | Temp 98.1°F | Resp 18 | Ht 67.0 in | Wt 177.8 lb

## 2020-09-30 DIAGNOSIS — J3089 Other allergic rhinitis: Secondary | ICD-10-CM

## 2020-09-30 DIAGNOSIS — J302 Other seasonal allergic rhinitis: Secondary | ICD-10-CM

## 2020-09-30 DIAGNOSIS — J4550 Severe persistent asthma, uncomplicated: Secondary | ICD-10-CM | POA: Diagnosis not present

## 2020-09-30 MED ORDER — BENRALIZUMAB 30 MG/ML ~~LOC~~ SOSY
30.0000 mg | PREFILLED_SYRINGE | SUBCUTANEOUS | Status: AC
  Administered 2020-10-06 – 2020-10-28 (×2): 30 mg via SUBCUTANEOUS

## 2020-09-30 NOTE — Telephone Encounter (Signed)
Returned pt's call regarding c/o compression stockings not staying up enough and sliding down her lower leg. She is going to call us back and let us know when she will be back in the area for an appt she has in a few days and will let us know when she is able to stop in to have [place] look at the stockings. No further questions/concerns at this time.

## 2020-09-30 NOTE — Patient Instructions (Addendum)
1. Moderate persistent asthma, uncomplicated - Lung testing looked better again. - I think we are on a good course.  - You can decrease your Symbicort to two puffs in the morning and see how you do with that.  - Daily controller medication(s): Symbicort 160/4.2mcg two puffs twice daily with spacer - Prior to physical activity: ProAir 2 puffs 10-15 minutes before physical activity. - Rescue medications: ProAir 4 puffs every 4-6 hours as needed or albuterol nebulizer one vial every 4-6 hours as needed - Changes during respiratory infections or worsening symptoms: Add on Asmanex 2 puffs twice daily for ONE TO TWO WEEKS. - Asthma control goals:  * Full participation in all desired activities (may need albuterol before activity) * Albuterol use two time or less a week on average (not counting use with activity) * Cough interfering with sleep two time or less a month * Oral steroids no more than once a year * No hospitalizations  2. Chronic rhinitis (weeds, indoor molds, outdoor molds, cat, dog, and cockroach) - Continue with fluticasone nasal spray 1-2 sprays per nostril daily. - Continue with Astelin 2 sprays per nostril up to twice daily as needed.  - Continue with cetirizine 10mg  daily.  3. Return in about 4 months (around 01/30/2021).    Please inform 04/01/2021 of any Emergency Department visits, hospitalizations, or changes in symptoms. Call us before going to the ED for breathing or allergy symptoms since we might be able to fit you in for a sick visit. Feel free to contact us anytime with any questions, problems, or concerns.  It was a pleasure to see you again today!  Websites that have reliable patient information: 1. American Academy of Asthma, Allergy, and Immunology: www.aaaai.org 2. Food Allergy Research and Education (FARE): foodallergy.org 3. Mothers of Asthmatics: http://www.asthmacommunitynetwork.org 4. American College of Allergy, Asthma, and Immunology:  www.acaai.org   COVID-19 Vaccine Information can be found at: [place] For questions related to vaccine distribution or appointments, please email [email or web address] or call [phone].   We realize that you might be concerned about having an allergic reaction to the COVID19 vaccines. To help with that concern, WE ARE OFFERING THE COVID19 VACCINES IN OUR OFFICE! Ask the front desk for dates!     "Like" [phone] on Facebook and Instagram for our latest updates!      A healthy democracy works best when [place] participate! Make sure you are registered to vote! If you have moved or changed any of your contact information, you will need to get this updated before voting!  In some cases, you MAY be able to register to vote online: [employer]

## 2020-09-30 NOTE — Progress Notes (Signed)
3  FOLLOW UP  Date of Service/Encounter:  09/30/20   Assessment:   Moderate persistent asthma - doing well on Faserna today  Seasonal and perennial allergic rhinitis(indoor and outdoor molds, cat, dog, cockroach)  Chronic back pain - with a history of scoliosis (rods placement in 2010) and now on disability  History of ground glass opacities in 2016 - repeat chest CT scheduled for May   Plan/Recommendations:   1. Moderate persistent asthma, uncomplicated - Lung testing looked better again. - I think we are on a good course.  - You can decrease your Symbicort to two puffs in the morning and see how you do with that.  - Daily controller medication(s): Symbicort 160/4.1mcg two puffs twice daily with spacer - Prior to physical activity: ProAir 2 puffs 10-15 minutes before physical activity. - Rescue medications: ProAir 4 puffs every 4-6 hours as needed or albuterol nebulizer one vial every 4-6 hours as needed - Changes during respiratory infections or worsening symptoms: Add on Asmanex 2 puffs twice daily for ONE TO TWO WEEKS. - Asthma control goals:  * Full participation in all desired activities (may need albuterol before activity) * Albuterol use two time or less a week on average (not counting use with activity) * Cough interfering with sleep two time or less a month * Oral steroids no more than once a year * No hospitalizations  2. Chronic rhinitis (weeds, indoor molds, outdoor molds, cat, dog, and cockroach) - Continue with fluticasone nasal spray 1-2 sprays per nostril daily. - Continue with Astelin 2 sprays per nostril up to twice daily as needed.  - Continue with cetirizine 10mg  daily.  3. Return in about 4 months (around 01/30/2021).    Subjective:   [name] is a 60 y.o. female presenting today for follow up of  Chief Complaint  Patient presents with  . Asthma    ACT - 23     [name] has a history of the following: Patient Active  Problem List   Diagnosis Date Noted  . Varicose veins of bilateral lower extremities with pain 01/02/2020  . Seasonal and perennial allergic rhinitis 04/15/2018  . 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 Chronic insomnia 11/29/2016  . Eosinophilia 03/29/2016  . Moderate persistent asthma, uncomplicated 03/29/2016  . 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 Other social stressor 03/29/2016  . 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 Arthritis of knee, degenerative 07/09/2015  . Chronic low back pain 04/15/2015  . Cough variant asthma 08/09/2014  . Pulmonary infiltrates with eosinophilia (HCC) 08/09/2014    History obtained from: chart review and patient.  [name] is a 60 y.o. female presenting for a follow up visit.  She has a history of severe persistent asthma.  She was last seen in March 2022.  After the last year, her frequency has required steroids increase.  I have been trying to get her on a biologic agent, but she continued to push back. We finally convinced her to start Fasenra at the last visit and we gave her a sample. We did have out Biologics Coordinator get the paperwork together for free drug; she was approved for this. We continued her on Symbicort two puffs BID as well as albuterol as needed. For her rhinitis, we continued with Flonase as well as Astelin and cetirizine.   Since the last visit, she has done very well. She reports that her April 2022 caused her to have immediate relief of her  symptoms. She is very happy that she made the decision to start.   Asthma/Respiratory Symptom History: She remains on her Symbicort two puffs BID.  [name]'s asthma has been well controlled. She has not required rescue medication, experienced nocturnal awakenings due to lower respiratory symptoms, nor have activities of daily living been limited. She has required no Emergency Department or Urgent Care visits for her asthma. She has required zero courses of  systemic steroids for asthma exacerbations since the last visit. ACT score today is 23, indicating excellent asthma symptom control.   Allergic Rhinitis Symptom History: She remains on her allergy medications. THese are fairly well controlled. She has not needed any antibiotics at all for her symptoms.   Her boyfriend has colorectal cancer. This has been a source of stress for them. He is getting treatments every few weeks and has been stable. He has a colostomy bag which might be his new normal.   Otherwise, there have been no changes to her past medical history, surgical history, family history, or social history.    Review of Systems  Constitutional: Negative.  Negative for chills, fever, malaise/fatigue and weight loss.  HENT: Negative.  Negative for congestion, ear discharge and ear pain.   Eyes: Negative for pain, discharge and redness.  Respiratory: Negative for cough, sputum production, shortness of breath and wheezing.   Cardiovascular: Negative.  Negative for chest pain and palpitations.  Gastrointestinal: Negative for abdominal pain, constipation, diarrhea, heartburn, nausea and vomiting.  Skin: Negative.  Negative for itching and rash.  Neurological: Negative for dizziness and headaches.  Endo/Heme/Allergies: Negative for environmental allergies. Does not bruise/bleed easily.       Objective:   Blood pressure 122/80, pulse 60, temperature 98.1 F (36.7 C), resp. rate 18, height 5\' 7"  (1.702 m), weight 177 lb 12.8 oz (80.6 kg), last menstrual period 08/30/2002, SpO2 98 %. Body mass index is 27.85 kg/m.   Physical Exam:  Physical Exam Vitals reviewed.  Constitutional:      Appearance: She is well-developed.  HENT:     Head: Normocephalic and atraumatic.     Right Ear: Tympanic membrane, ear canal and external ear normal.     Left Ear: Tympanic membrane, ear canal and external ear normal.     Nose: No nasal deformity, septal deviation, mucosal edema or rhinorrhea.      Right Turbinates: Enlarged and swollen.     Left Turbinates: Enlarged and swollen.     Right Sinus: No maxillary sinus tenderness or frontal sinus tenderness.     Left Sinus: No maxillary sinus tenderness or frontal sinus tenderness.     Mouth/Throat:     Mouth: Mucous membranes are not pale and not dry.     Pharynx: Uvula midline.  Eyes:     General:        Right eye: No discharge.        Left eye: No discharge.     Conjunctiva/sclera: Conjunctivae normal.     Right eye: Right conjunctiva is not injected. No chemosis.    Left eye: Left conjunctiva is not injected. No chemosis.    Pupils: Pupils are equal, round, and reactive to light.  Cardiovascular:     Rate and Rhythm: Normal rate and regular rhythm.     Heart sounds: Normal heart sounds.  Pulmonary:     Effort: Pulmonary effort is normal. No tachypnea, accessory muscle usage or respiratory distress.     Breath sounds: Normal breath sounds. No wheezing, rhonchi or rales.  Comments: Moving air well in all lung fields. Chest:     Chest wall: No tenderness.  Lymphadenopathy:     Cervical: No cervical adenopathy.  Skin:    General: Skin is warm.     Capillary Refill: Capillary refill takes less than 2 seconds.     Coloration: Skin is not pale.     Findings: No abrasion, erythema, petechiae or rash. Rash is not papular, urticarial or vesicular.  Neurological:     Mental Status: She is alert.  Psychiatric:        Behavior: Behavior is cooperative.      Diagnostic studies:    Spirometry: results normal (FEV1: 2.40/83%, FVC: 3.18/85%, FEV1/FVC: 75%).    Spirometry consistent with normal pattern.   Allergy Studies: none        Malachi Bonds, MD  [facility]

## 2020-10-01 ENCOUNTER — Telehealth: Payer: Self-pay

## 2020-10-01 NOTE — Telephone Encounter (Signed)
Called patient in regards to mammogram -  Patient states that cologuard was suppose to be ordered for her but she has not received - can you check on this for the patient and call her back

## 2020-10-01 NOTE — Telephone Encounter (Signed)
Lmtcb. Cologuard ordered 4/7

## 2020-10-02 ENCOUNTER — Ambulatory Visit (HOSPITAL_COMMUNITY): Payer: Medicare Other

## 2020-10-06 ENCOUNTER — Ambulatory Visit (INDEPENDENT_AMBULATORY_CARE_PROVIDER_SITE_OTHER): Payer: Medicare Other

## 2020-10-06 VITALS — Ht 67.0 in | Wt 177.0 lb

## 2020-10-06 DIAGNOSIS — Z0000 Encounter for general adult medical examination without abnormal findings: Secondary | ICD-10-CM

## 2020-10-06 DIAGNOSIS — Z1212 Encounter for screening for malignant neoplasm of rectum: Secondary | ICD-10-CM | POA: Diagnosis not present

## 2020-10-06 DIAGNOSIS — Z1211 Encounter for screening for malignant neoplasm of colon: Secondary | ICD-10-CM | POA: Diagnosis not present

## 2020-10-06 NOTE — Progress Notes (Signed)
Subjective:   [name] is a 60 y.o. female who presents for Medicare Annual (Subsequent) preventive examination.  Virtual Visit via Telephone Note  I connected with  [name] on 10/06/20 at  1:15 PM EDT by telephone and verified that I am speaking with the correct person using two identifiers.  Location: Patient: Home Provider: [facility] Persons participating in the virtual visit: patient/Nurse Health Advisor   I discussed the limitations, risks, security and privacy concerns of performing an evaluation and management service by telephone and the availability of in person appointments. The patient expressed understanding and agreed to proceed.  Interactive audio and video telecommunications were attempted between this nurse and patient, however failed, due to patient having technical difficulties OR patient did not have access to video capability.  We continued and completed visit with audio only.  Some vital signs may be absent or patient reported.   [name] Michelle Jahnasia Tatum, LPN   Review of Systems     Cardiac Risk Factors include: advanced age (>45men, >59 women);dyslipidemia;Other (see comment), Risk factor comments: asthma     Objective:    Today's Vitals   10/06/20 1234  Weight: 177 lb (80.3 kg)  Height: 5\' 7"  (1.702 m)   Body mass index is 27.72 kg/m.  Advanced Directives 10/06/2020 10/04/2019 03/13/2017 10/15/2014 08/06/2014  Does Patient Have a Medical Advance Directive? No No No No No  Would patient like information on creating a medical advance directive? No - Patient declined No - Patient declined No - Patient declined - No - patient declined information    Current Medications (verified) Outpatient Encounter Medications as of 10/06/2020  Medication Sig  . albuterol (PROVENTIL) (2.5 MG/3ML) 0.083% nebulizer solution NEBULIZE ONE AMPULE EVERY 4 HOURS AS NEEDED FOR COUGH OR WHEEZE -  . albuterol (VENTOLIN HFA) 108 (90 Base) MCG/ACT inhaler INHALE TWO PUFFS INTO LUNGS EVERY  4 HOURS AS NEEDED FOR WHEEZING OR SHORTNESS OF BREATH  . ARNUITY ELLIPTA 100 MCG/ACT AEPB Inhale 1 puff into the lungs daily. For 1-2 weeks during signs and symptoms of upper respiratory infection.  12/06/2020 azelastine (ASTELIN) 0.1 % nasal spray INSTILL 2 SPRAYS IN EACH NOSTRIL TWICE DAILY AS NEEDED  . diclofenac (VOLTAREN) 75 MG EC tablet Take 75 mg by mouth 2 (two) times daily with a meal.  . EPINEPHrine 0.3 mg/0.3 mL IJ SOAJ injection Inject into the muscle.  . fluticasone (FLONASE) 50 MCG/ACT nasal spray INSTILL 2 SPRAYS INTO EACH NOSTRIL TWICE DAILY  . hydrOXYzine (ATARAX/VISTARIL) 50 MG tablet Take 0.5-1 tablets (25-50 mg total) by mouth every 8 (eight) hours as needed for anxiety.  Marland Kitchen ibuprofen (ADVIL) 800 MG tablet Take 800 mg by mouth 3 (three) times daily as needed.  Marland Kitchen levocetirizine (XYZAL) 5 MG tablet TAKE 1 TABLET BY MOUTH EVERY MORNING  . montelukast (SINGULAIR) 10 MG tablet TAKE 1 TABLET BY MOUTH AT BEDTIME  . morphine (MSIR) 15 MG tablet Take 15 mg by mouth every 8 (eight) hours as needed. Back Pain  . pantoprazole (PROTONIX) 40 MG tablet TAKE 1 TABLET BY MOUTH DAILY  . SYMBICORT 160-4.5 MCG/ACT inhaler INHALE TWO PUFFS INTO THE LUNGS TWICE DAILY  . tiZANidine (ZANAFLEX) 4 MG tablet Take 4 mg by mouth 2 (two) times daily as needed.  Marland Kitchen ibuprofen (ADVIL) 600 MG tablet Take 1 tablet (600 mg total) by mouth every 8 (eight) hours as needed. (Patient not taking: Reported on 10/06/2020)  . ipratropium-albuterol (DUONEB) 0.5-2.5 (3) MG/3ML SOLN Take 3 mLs by nebulization every  4 (four) hours as needed.   Facility-Administered Encounter Medications as of 10/06/2020  Medication  . Benralizumab SOSY 30 mg    Allergies (verified) Patient has no known allergies.   History: Past Medical History:  Diagnosis Date  . Arthritis    Spine, R knee  . Asthma   . Back pain   . Cough 09/03/2014   Followed in Pulmonary clinic/ [facility]/ [place] - sinus CT 09/08/2014> Mild mucosal thickening  involves the paranasal sinuses  - Eos 1.2  08/06/14 - Allergy profile 11/27/14 >  Eos 1.0/ IgE  41 with neg RAST    . GERD (gastroesophageal reflux disease)   . History of blood transfusion    Back surgery   Past Surgical History:  Procedure Laterality Date  . ABDOMINAL HYSTERECTOMY     "cysts", pelvic pain  . BACK SURGERY    . KNEE ARTHROSCOPY Right 2017  . SPINE SURGERY  2010   fusion  . SPINE SURGERY  2014   remove hardware   Family History  Problem Relation Age of Onset  . Emphysema Maternal Grandmother        never smoker  . COPD Maternal Grandmother   . Emphysema Maternal Uncle        smoked  . COPD Maternal Uncle   . Arthritis Mother   . Diabetes Mother   . Hyperlipidemia Mother   . Hypertension Mother   . Early death Father 18       MVA  . Asthma Sister   . Asthma Brother   . Heart disease Maternal Grandfather    Social History   Socioeconomic History  . Marital status: Significant Other    Spouse name: [name]  . Number of children: 2  . Years of education: 30  . Highest education level: Not on file  Occupational History  . Occupation: disabled    Comment: back  Tobacco Use  . Smoking status: Never Smoker  . Smokeless tobacco: Never Used  Vaping Use  . Vaping Use: Never used  Substance and Sexual Activity  . Alcohol use: No  . Drug use: No  . Sexual activity: Yes    Birth control/protection: Surgical  Other Topics Concern  . Not on file  Social History Narrative   Lives with [name] - 27 years   [name] has end stage colon cancer   Two sons/ grandchildren   Disabled from back pain/had scoliosis. Has had back reconstruction.    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    Social Determinants of Health   Financial Resource Strain: Low Risk   . Difficulty of Paying Living Expenses: Not hard at all  Food Insecurity: No Food Insecurity  . Worried About Programme researcher, broadcasting/film/video in the Last Year: Never true  . Ran Out of Food in the Last Year: Never true  Transportation Needs:  No Transportation Needs  . Lack of Transportation (Medical): No  . Lack of Transportation (Non-Medical): No  Physical Activity: Sufficiently Active  . Days of Exercise per Week: 3 days  . Minutes of Exercise per Session: 60 min  Stress: No Stress Concern Present  . Feeling of Stress : Only a little  Social Connections: Moderately Integrated  . Frequency of Communication with Friends and Family: More than three times a week  . Frequency of Social Gatherings with Friends and Family: Once a week  . Attends Religious Services: More than 4 times per year  . Active Member of Clubs or Organizations: No  . Attends Club  or Organization Meetings: Never  . Marital Status: Living with partner    Tobacco Counseling Counseling given: Not Answered   Clinical Intake:  Pre-visit preparation completed: Yes  Pain : No/denies pain     BMI - recorded: 27.72 Nutritional Status: BMI 25 -29 Overweight Nutritional Risks: None Diabetes: No  How often do you need to have someone help you when you read instructions, pamphlets, or other written materials from your doctor or pharmacy?: 1 - Never  Diabetic? No  Interpreter Needed?: No  Information entered by :: Ruffus Kamaka, LPN   Activities of Daily Living In your present state of health, do you have any difficulty performing the following activities: 10/06/2020  Hearing? N  Vision? N  Difficulty concentrating or making decisions? N  Walking or climbing stairs? Y  Comment sometimes knee hurts, but she can do this  Dressing or bathing? N  Doing errands, shopping? N  Preparing Food and eating ? N  Using the Toilet? N  In the past six months, have you accidently leaked urine? N  Do you have problems with loss of bowel control? N  Managing your Medications? N  Managing your Finances? N  Housekeeping or managing your Housekeeping? N  Some recent data might be hidden    Patient Care Team: Raliegh IpGottschalk, Ashly M, DO as PCP - General (Family  Medicine) Jonelle SidleMcDowell, Samuel G, MD as PCP - Cardiology (Cardiology)  Indicate any recent Medical Services you may have received from other than [facility] providers in the past year (date may be approximate).     Assessment:   This is a routine wellness examination [facility].  Hearing/Vision screen  Hearing Screening   125Hz  250Hz  500Hz  1000Hz  2000Hz  3000Hz  4000Hz  6000Hz  8000Hz   Right ear:           Left ear:           Comments: Denies hearing difficulty  Vision Screening Comments: Wears glasses - [place] 2 years  Dietary issues and exercise activities discussed: Current Exercise Habits: Structured exercise class, Time (Minutes): 60, Frequency (Times/Week): 3, Weekly Exercise (Minutes/Week): 180, Intensity: Moderate, Exercise limited by: orthopedic condition(s)  Goals    . Exercise 3x per week (30 min per time)     Gym 3x per week for an hour each time    . Prevent falls      Depression Screen PHQ 2/9 Scores 10/06/2020 08/21/2020 01/02/2020 10/04/2019 08/21/2019 04/16/2018 01/31/2018  PHQ - 2 Score 0 0 0 0 0 0 0  PHQ- 9 Score 2 0 - - 0 - -    Fall Risk Fall Risk  10/06/2020 09/10/2020 01/02/2020 10/04/2019 04/16/2018  Falls in the past year? 0 0 1 0 No  Number falls in past yr: 0 0 1 - -  Injury with Fall? 0 0 0 - -  Risk for fall due to : Orthopedic patient - History of fall(s) - -  Follow up Falls prevention discussed - Falls evaluation completed - -    FALL RISK PREVENTION PERTAINING TO THE HOME:  Any stairs in or around the home? Yes  If so, are there any without handrails? No  Home free of loose throw rugs in walkways, pet beds, electrical cords, etc? Yes  Adequate lighting in your home to reduce risk of falls? Yes   ASSISTIVE DEVICES UTILIZED TO PREVENT FALLS:  Life alert? No  Use of a cane, walker or w/c? Yes  Grab bars in the bathroom? Yes  Shower  chair or bench in shower? Yes  Elevated toilet seat or a handicapped toilet? No   TIMED UP AND GO:  Was the  test performed? No . Telephonic visit.  Cognitive Function: Normal cognitive status assessed by direct observation by this Nurse Health Advisor. No abnormalities found.      6CIT Screen 10/04/2019  What time? 0 points  Count back from 20 0 points  Months in reverse 0 points  Repeat phrase 0 points    Immunizations Immunization History  Administered Date(s) Administered  . Influenza, Seasonal, Injecte, Preservative Fre 05/28/2013  . Influenza,inj,Quad PF,6+ Mos 06/07/2017, 04/16/2018  . Pneumococcal Conjugate-13 11/29/2017  . Td 06/28/2011  . Tdap 06/29/2011    TDAP status: Up to date  Flu Vaccine status: Due, Education has been provided regarding the importance of this vaccine. Advised may receive this vaccine at local pharmacy or Health Dept. Aware to provide a copy of the vaccination record if obtained from local pharmacy or Health Dept. Verbalized acceptance and understanding.  Pneumococcal vaccine status: Up to date  Covid-19 vaccine status: Declined, Education has been provided regarding the importance of this vaccine but patient still declined. Advised may receive this vaccine at local pharmacy or Health Dept.or vaccine clinic. Aware to provide a copy of the vaccination record if obtained from local pharmacy or Health Dept. Verbalized acceptance and understanding.  Qualifies for Shingles Vaccine? Yes   Zostavax completed No   Shingrix Completed?: No.    Education has been provided regarding the importance of this vaccine. Patient has been advised to call insurance company to determine out of pocket expense if they have not yet received this vaccine. Advised may also receive vaccine at local pharmacy or Health Dept. Verbalized acceptance and understanding.  Screening Tests Health Maintenance  Topic Date Due  . HIV Screening  Never done  . MAMMOGRAM  12/15/2019  . Fecal DNA (Cologuard)  01/03/2020  . INFLUENZA VACCINE  01/25/2021  . TETANUS/TDAP  06/28/2021  . Hepatitis C  Screening  Completed  . HPV VACCINES  Aged Out  . PAP SMEAR-Modifier  Discontinued    Health Maintenance  Health Maintenance Due  Topic Date Due  . HIV Screening  Never done  . MAMMOGRAM  12/15/2019  . Fecal DNA (Cologuard)  01/03/2020    Colorectal cancer screening: Type of screening: Cologuard. Completed 09/08/2016. Repeat every 3 years She is waiting on this to come in the mail - I sent another order today in case it didn't go through last time.  Mammogram status: Ordered 09/2020. Pt provided with contact info and advised to call to schedule appt.   Bone Density scan due at age 29  Lung Cancer Screening: (Low Dose CT Chest recommended if Age 22-80 years, 30 pack-year currently smoking OR have quit w/in 15years.) does not qualify.   Additional Screening:  Hepatitis C Screening: does qualify; Completed 11/29/2016  Vision Screening: Recommended annual ophthalmology exams for early detection of glaucoma and other disorders of the eye. Is the patient up to date with their annual eye exam?  Yes  Who is the provider or what is the name of the office in which the patient attends annual eye exams? Dr Conley Rolls in [place] If pt is not established with a provider, would they like to be referred to a provider to establish care? No .   Dental Screening: Recommended annual dental exams for proper oral hygiene  Community Resource Referral / Chronic Care Management: CRR required this visit?  No  CCM required this visit?  No      Plan:     I have personally reviewed and noted the following in the patient's chart:   . Medical and social history . Use of alcohol, tobacco or illicit drugs  . Current medications and supplements . Functional ability and status . Nutritional status . Physical activity . Advanced directives . List of other physicians . Hospitalizations, surgeries, and ER visits in previous 12 months . Vitals . Screenings to include cognitive, depression, and  falls . Referrals and appointments  In addition, I have reviewed and discussed with patient certain preventive protocols, quality metrics, and best practice recommendations. A written personalized care plan for preventive services as well as general preventive health recommendations were provided to patient.     [place] Constable, LPN   [id number]   Nurse Notes: Cologuard ordered today; patient says she will schedule mammogram soon. Also, after last visit, she was told cholesterol was elevated and she needed this rechecked in 3 months. She only has one year check up scheduled. She doesn't want medication at this time and is trying to lower it on her own.

## 2020-10-06 NOTE — Patient Instructions (Addendum)
[name] , Thank you for taking time to come for your Medicare Wellness Visit. I appreciate your ongoing commitment to your health goals. Please review the following plan we discussed and let me know if I can assist you in the future.   Screening recommendations/referrals: Colonoscopy: Cologuard done 08/2016 - Ordered another today Mammogram: Done 12/14/2017 - patient will schedule another soon Bone Density: Due at age 60 Recommended yearly ophthalmology/optometry visit for glaucoma screening and checkup Recommended yearly dental visit for hygiene and checkup  Vaccinations: Influenza vaccine: Declined last year - due in Fall 2022 Pneumococcal vaccine: Prevnar-13 done 11/29/2017; Pneumovax-23 due at age 89 Tdap vaccine: Done 06/29/2011 - Repeat in 10 years Shingles vaccine: Shingrix discussed. Please contact your pharmacy for coverage information.   Covid-19: Declined  Advanced directives: Advance directive discussed with you today. Even though you declined this today, please call our office should you change your mind, and we can give you the proper paperwork for you to fill out.  Conditions/risks identified: Continue exercising and trying to eat healthy. I have attached tips for lowering your cholesterol.  Next appointment: Follow up in one year for your annual wellness visit.   Preventive Care 40-64 Years, Female Preventive care refers to lifestyle choices and visits with your health care provider that can promote health and wellness. What does preventive care include?  A yearly physical exam. This is also called an annual well check.  Dental exams once or twice a year.  Routine eye exams. Ask your health care provider how often you should have your eyes checked.  Personal lifestyle choices, including:  Daily care of your teeth and gums.  Regular physical activity.  Eating a healthy diet.  Avoiding tobacco and drug use.  Limiting alcohol use.  Practicing safe sex.  Taking  low-dose aspirin daily starting at age 4.  Taking vitamin and mineral supplements as recommended by your health care provider. What happens during an annual well check? The services and screenings done by your health care provider during your annual well check will depend on your age, overall health, lifestyle risk factors, and family history of disease. Counseling  Your health care provider may ask you questions about your:  Alcohol use.  Tobacco use.  Drug use.  Emotional well-being.  Home and relationship well-being.  Sexual activity.  Eating habits.  Work and work Statistician.  Method of birth control.  Menstrual cycle.  Pregnancy history. Screening  You may have the following tests or measurements:  Height, weight, and BMI.  Blood pressure.  Lipid and cholesterol levels. These may be checked every 5 years, or more frequently if you are over 17 years old.  Skin check.  Lung cancer screening. You may have this screening every year starting at age 17 if you have a 30-pack-year history of smoking and currently smoke or have quit within the past 15 years.  Fecal occult blood test (FOBT) of the stool. You may have this test every year starting at age 91.  Flexible sigmoidoscopy or colonoscopy. You may have a sigmoidoscopy every 5 years or a colonoscopy every 10 years starting at age 59.  Hepatitis C blood test.  Hepatitis B blood test.  Sexually transmitted disease (STD) testing.  Diabetes screening. This is done by checking your blood sugar (glucose) after you have not eaten for a while (fasting). You may have this done every 1-3 years.  Mammogram. This may be done every 1-2 years. Talk to your health care provider about when you  should start having regular mammograms. This may depend on whether you have a family history of breast cancer.  BRCA-related cancer screening. This may be done if you have a family history of breast, ovarian, tubal, or peritoneal  cancers.  Pelvic exam and Pap test. This may be done every 3 years starting at age 14. Starting at age 40, this may be done every 5 years if you have a Pap test in combination with an HPV test.  Bone density scan. This is done to screen for osteoporosis. You may have this scan if you are at high risk for osteoporosis. Discuss your test results, treatment options, and if necessary, the need for more tests with your health care provider. Vaccines  Your health care provider may recommend certain vaccines, such as:  Influenza vaccine. This is recommended every year.  Tetanus, diphtheria, and acellular pertussis (Tdap, Td) vaccine. You may need a Td booster every 10 years.  Zoster vaccine. You may need this after age 3.  Pneumococcal 13-valent conjugate (PCV13) vaccine. You may need this if you have certain conditions and were not previously vaccinated.  Pneumococcal polysaccharide (PPSV23) vaccine. You may need one or two doses if you smoke cigarettes or if you have certain conditions. Talk to your health care provider about which screenings and vaccines you need and how often you need them. This information is not intended to replace advice given to you by your health care provider. Make sure you discuss any questions you have with your health care provider. Document Released: 07/10/2015 Document Revised: 03/02/2016 Document Reviewed: 04/14/2015 Elsevier Interactive Patient Education  2017 [employer] Prevention in the Home Falls can cause injuries. They can happen to people of all ages. There are many things you can do to make your home safe and to help prevent falls. What can I do on the outside of my home?  Regularly fix the edges of walkways and driveways and fix any cracks.  Remove anything that might make you trip as you walk through a door, such as a raised step or threshold.  Trim any bushes or trees on the path to your home.  Use bright outdoor lighting.  Clear  any walking paths of anything that might make someone trip, such as rocks or tools.  Regularly check to see if handrails are loose or broken. Make sure that both sides of any steps have handrails.  Any raised decks and porches should have guardrails on the edges.  Have any leaves, snow, or ice cleared regularly.  Use sand or salt on walking paths during winter.  Clean up any spills in your garage right away. This includes oil or grease spills. What can I do in the bathroom?  Use night lights.  Install grab bars by the toilet and in the tub and shower. Do not use towel bars as grab bars.  Use non-skid mats or decals in the tub or shower.  If you need to sit down in the shower, use a plastic, non-slip stool.  Keep the floor dry. Clean up any water that spills on the floor as soon as it happens.  Remove soap buildup in the tub or shower regularly.  Attach bath mats securely with double-sided non-slip rug tape.  Do not have throw rugs and other things on the floor that can make you trip. What can I do in the bedroom?  Use night lights.  Make sure that you have a light by your bed that is  easy to reach.  Do not use any sheets or blankets that are too big for your bed. They should not hang down onto the floor.  Have a firm chair that has side arms. You can use this for support while you get dressed.  Do not have throw rugs and other things on the floor that can make you trip. What can I do in the kitchen?  Clean up any spills right away.  Avoid walking on wet floors.  Keep items that you use a lot in easy-to-reach places.  If you need to reach something above you, use a strong step stool that has a grab bar.  Keep electrical cords out of the way.  Do not use floor polish or wax that makes floors slippery. If you must use wax, use non-skid floor wax.  Do not have throw rugs and other things on the floor that can make you trip. What can I do with my stairs?  Do not  leave any items on the stairs.  Make sure that there are handrails on both sides of the stairs and use them. Fix handrails that are broken or loose. Make sure that handrails are as long as the stairways.  Check any carpeting to make sure that it is firmly attached to the stairs. Fix any carpet that is loose or worn.  Avoid having throw rugs at the top or bottom of the stairs. If you do have throw rugs, attach them to the floor with carpet tape.  Make sure that you have a light switch at the top of the stairs and the bottom of the stairs. If you do not have them, ask someone to add them for you. What else can I do to help prevent falls?  Wear shoes that:  Do not have high heels.  Have rubber bottoms.  Are comfortable and fit you well.  Are closed at the toe. Do not wear sandals.  If you use a stepladder:  Make sure that it is fully opened. Do not climb a closed stepladder.  Make sure that both sides of the stepladder are locked into place.  Ask someone to hold it for you, if possible.  Clearly mark and make sure that you can see:  Any grab bars or handrails.  First and last steps.  Where the edge of each step is.  Use tools that help you move around (mobility aids) if they are needed. These include:  Canes.  Walkers.  Scooters.  Crutches.  Turn on the lights when you go into a dark area. Replace any light bulbs as soon as they burn out.  Set up your furniture so you have a clear path. Avoid moving your furniture around.  If any of your floors are uneven, fix them.  If there are any pets around you, be aware of where they are.  Review your medicines with your doctor. Some medicines can make you feel dizzy. This can increase your chance of falling. Ask your doctor what other things that you can do to help prevent falls. This information is not intended to replace advice given to you by your health care provider. Make sure you discuss any questions you have with  your health care provider. Document Released: 04/09/2009 Document Revised: 11/19/2015 Document Reviewed: 07/18/2014 Elsevier Interactive Patient Education  2017 [place].    High Cholesterol  High cholesterol is a condition in which the blood has high levels of a white, waxy substance similar to fat (cholesterol). The  liver makes all the cholesterol that the body needs. The human body needs small amounts of cholesterol to help build cells. A person gets extra or excess cholesterol from the food that he or she eats. The blood carries cholesterol from the liver to the rest of the body. If you have high cholesterol, deposits (plaques) may build up on the walls of your arteries. Arteries are the blood vessels that carry blood away from your heart. These plaques make the arteries narrow and stiff. Cholesterol plaques increase your risk for heart attack and stroke. Work with your health care provider to keep your cholesterol levels in a healthy range. What increases the risk? The following factors may make you more likely to develop this condition:  Eating foods that are high in animal fat (saturated fat) or cholesterol.  Being overweight.  Not getting enough exercise.  A family history of high cholesterol (familial hypercholesterolemia).  Use of tobacco products.  Having diabetes. What are the signs or symptoms? There are no symptoms of this condition. How is this diagnosed? This condition may be diagnosed based on the results of a blood test.  If you are older than 58 years of age, your health care provider may check your cholesterol levels every 4-6 years.  You may be checked more often if you have high cholesterol or other risk factors for heart disease. The blood test for cholesterol measures:  "Bad" cholesterol, or LDL cholesterol. This is the main type of cholesterol that causes heart disease. The desired level is less than 100 mg/dL.  "Good" cholesterol, or HDL  cholesterol. HDL helps protect against heart disease by cleaning the arteries and carrying the LDL to the liver for processing. The desired level for HDL is 60 mg/dL or higher.  Triglycerides. These are fats that your body can store or burn for energy. The desired level is less than 150 mg/dL.  Total cholesterol. This measures the total amount of cholesterol in your blood and includes LDL, HDL, and triglycerides. The desired level is less than 200 mg/dL. How is this treated? This condition may be treated with:  Diet changes. You may be asked to eat foods that have more fiber and less saturated fats or added sugar.  Lifestyle changes. These may include regular exercise, maintaining a healthy weight, and quitting use of tobacco products.  Medicines. These are given when diet and lifestyle changes have not worked. You may be prescribed a statin medicine to help lower your cholesterol levels. Follow these instructions at home: Eating and drinking  Eat a healthy, balanced diet. This diet includes: ? Daily servings of a variety of fresh, frozen, or canned fruits and vegetables. ? Daily servings of whole grain foods that are rich in fiber. ? Foods that are low in saturated fats and trans fats. These include poultry and fish without skin, lean cuts of meat, and low-fat dairy products. ? A variety of fish, especially oily fish that contain omega-3 fatty acids. Aim to eat fish at least 2 times a week.  Avoid foods and drinks that have added sugar.  Use healthy cooking methods, such as roasting, grilling, broiling, baking, poaching, steaming, and stir-frying. Do not fry your food except for stir-frying.   Lifestyle  Get regular exercise. Aim to exercise for a total of 150 minutes a week. Increase your activity level by doing activities such as gardening, walking, and taking the stairs.  Do not use any products that contain nicotine or tobacco, such as cigarettes, e-cigarettes, and chewing  tobacco.  If you need help quitting, ask your health care provider.   General instructions  Take over-the-counter and prescription medicines only as told by your health care provider.  Keep all follow-up visits as told by your health care provider. This is important. Where to find more information  American Heart Association: www.heart.org  National Heart, Lung, and Blood Institute: https://wilson-eaton.com/ Contact a health care provider if:  You have trouble achieving or maintaining a healthy diet or weight.  You are starting an exercise program.  You are unable to stop smoking. Get help right away if:  You have chest pain.  You have trouble breathing.  You have any symptoms of a stroke. "BE FAST" is an easy way to remember the main warning signs of a stroke: ? B - Balance. Signs are dizziness, sudden trouble walking, or loss of balance. ? E - Eyes. Signs are trouble seeing or a sudden change in vision. ? F - Face. Signs are sudden weakness or numbness of the face, or the face or eyelid drooping on one side. ? A - Arms. Signs are weakness or numbness in an arm. This happens suddenly and usually on one side of the body. ? S - Speech. Signs are sudden trouble speaking, slurred speech, or trouble understanding what people say. ? T - Time. Time to call emergency services. Write down what time symptoms started.  You have other signs of a stroke, such as: ? A sudden, severe headache with no known cause. ? Nausea or vomiting. ? Seizure. These symptoms may represent a serious problem that is an emergency. Do not wait to see if the symptoms will go away. Get medical help right away. Call your local emergency services (911 in the [place] Do not drive yourself to the hospital. Summary  Cholesterol plaques increase your risk for heart attack and stroke. Work with your health care provider to keep your cholesterol levels in a healthy range.  Eat a healthy, balanced diet, get regular exercise, and maintain  a healthy weight.  Do not use any products that contain nicotine or tobacco, such as cigarettes, e-cigarettes, and chewing tobacco.  Get help right away if you have any symptoms of a stroke. This information is not intended to replace advice given to you by your health care provider. Make sure you discuss any questions you have with your health care provider. Document Revised: 05/13/2019 Document Reviewed: 05/13/2019 Elsevier Patient Education  2021 [employer].

## 2020-10-12 ENCOUNTER — Encounter: Payer: Self-pay | Admitting: Nutrition

## 2020-10-12 ENCOUNTER — Ambulatory Visit: Payer: Medicare Other | Admitting: Nutrition

## 2020-10-12 ENCOUNTER — Other Ambulatory Visit: Payer: Self-pay

## 2020-10-12 ENCOUNTER — Encounter: Payer: Medicare Other | Attending: Family Medicine | Admitting: Nutrition

## 2020-10-12 VITALS — Ht 67.0 in | Wt 178.6 lb

## 2020-10-12 DIAGNOSIS — E782 Mixed hyperlipidemia: Secondary | ICD-10-CM | POA: Insufficient documentation

## 2020-10-12 DIAGNOSIS — E781 Pure hyperglyceridemia: Secondary | ICD-10-CM | POA: Insufficient documentation

## 2020-10-12 NOTE — Patient Instructions (Addendum)
  Goals Keep up the great job Add oatmeal or high fiber cereal with eggs for breakfast. Eat protein and low carb veggies for lunch. Aim for 25 grams of fiber per day Focus on lower carb vegetables-try spinach salad with romaine Keep eating fruit. Continue to increase activity. Get TG Less than 150 and TCHOL less than 200 mg/dl.

## 2020-10-12 NOTE — Progress Notes (Signed)
Medical Nutrition Therapy Follow up  Appointment Start time:  0945  Appointment End time: 1015  Primary concerns today: Hyperlipidemia Referral diagnosis: E78.0 Preferred learning style:  no preference indicated Learning readiness: change in progress   NUTRITION ASSESSMENT   Changes made: Cut out buying drinks anymore. Eating carb smart breads and eating more baked and broiled foods.. Drinking almond milk and cut out whole milk. Not eating any fried or processed foods anymore. Feels much better. Her breathing is much better.   Previous goals: Cut out sweets, desserts, candy-done. Cut down on salt-done Use Mrs Sharilyn Sites and other herbs and spices-done Choose high fiber foods.-25 grams of fiber per day. Doing well. Drink 100 oz of water-done  Anthropometrics  Wt Readings from Last 3 Encounters:  10/12/20 178 lb 9.6 oz (81 kg)  10/06/20 177 lb (80.3 kg)  09/30/20 177 lb 12.8 oz (80.6 kg)   Ht Readings from Last 3 Encounters:  10/12/20 5\' 7"  (1.702 m)  10/06/20 5\' 7"  (1.702 m)  09/30/20 5\' 7"  (1.702 m)   Body mass index is 27.97 kg/m. @BMIFA @ Facility age limit for growth percentiles is 20 years. Facility age limit for growth percentiles is 20 years.  Clinical Medical Hx: Hyperlipidemia, has rods down her back, back pains Medications: see list Labs:  Lipid Panel     Component Value Date/Time   CHOL 202 (H) 08/21/2020 0948   TRIG 346 (H) 08/21/2020 0948   HDL 41 08/21/2020 0948   CHOLHDL 4.9 (H) 08/21/2020 0948   CHOLHDL 4.2 12/14/2017 0905   VLDL 65 (H) 11/29/2016 1112   LDLCALC 102 (H) 08/21/2020 0948   LDLCALC 98 12/14/2017 0905   LABVLDL 59 (H) 08/21/2020 0948    Notable Signs/Symptoms: Cravings for salty and sweet foods  Lifestyle & Dietary Hx Doing better with planning meals and eating more fresh fruits and vegetables.  Estimated daily fluid intake: 24 oz Supplements:  Sleep: 7/8 hrs Stress / self-care: COPD and cravings  Current average weekly physical  activity: not much  24-Hr Dietary Recall B) Eggs and fruit, 1 c almond milk L) fruit and 1 cup milk. D)  Grilled chicken,  Wrap, water  Drinks 80 oz. Estimated Energy Needs Calories: 1800  Carbohydrate: 200g Protein: 150g Fat: 56g   NUTRITION DIAGNOSIS  NI-5.6.2 Excessive fat intake As related to Hyperlipidemia.  As evidenced by TCHOL 202 and LABVLDL 59 and TG 246.   NUTRITION INTERVENTION  Nutrition education (E-1) on the following topics:  . Low Fat Low Salt High Fiber diet . Meal PLanning, portion control Cardiac health. Handouts Provided Include   Low Cholesterol and low TG diet  My Plate  Meal Plan Card   Learning Style & Readiness for Change Teaching method utilized: Visual & Auditory  Demonstrated degree of understanding via: Teach Back  Barriers to learning/adherence to lifestyle change: none   Goals Keep up the great job Add oatmeal or high fiber cereal with eggs for breakfast. Eat protein and low carb veggies for lunch. Aim for 25 grams of fiber per day Focus on lower carb vegetables-try spinach salad with romaine Keep eating fruit. Continue to increase activity. Get TG Less than 150 and TCHOL less than 200 mg/dl.  MONITORING & EVALUATION Dietary intake, weekly physical activity, in 1 month.  Next Steps  Patient is to keep food journal.

## 2020-10-18 ENCOUNTER — Other Ambulatory Visit: Payer: Self-pay | Admitting: Allergy & Immunology

## 2020-10-19 NOTE — Telephone Encounter (Signed)
Last seen 09/30/20 and is not due to be seen until august of 2022

## 2020-10-22 ENCOUNTER — Ambulatory Visit (HOSPITAL_COMMUNITY)
Admission: RE | Admit: 2020-10-22 | Discharge: 2020-10-22 | Disposition: A | Discharge status: Home / Self Care | Payer: Medicare Other | Source: Ambulatory Visit | Attending: Family Medicine | Admitting: Family Medicine

## 2020-10-22 ENCOUNTER — Other Ambulatory Visit: Payer: Self-pay

## 2020-10-22 DIAGNOSIS — Z1231 Encounter for screening mammogram for malignant neoplasm of breast: Secondary | ICD-10-CM | POA: Diagnosis present

## 2020-10-26 ENCOUNTER — Other Ambulatory Visit: Payer: Self-pay | Admitting: Family Medicine

## 2020-10-26 ENCOUNTER — Ambulatory Visit (HOSPITAL_COMMUNITY)
Admission: RE | Admit: 2020-10-26 | Discharge: 2020-10-26 | Disposition: A | Discharge status: Home / Self Care | Payer: Medicare Other | Source: Ambulatory Visit | Attending: Allergy & Immunology | Admitting: Allergy & Immunology

## 2020-10-26 DIAGNOSIS — R0602 Shortness of breath: Secondary | ICD-10-CM | POA: Insufficient documentation

## 2020-10-26 DIAGNOSIS — F410 Panic disorder [episodic paroxysmal anxiety] without agoraphobia: Secondary | ICD-10-CM

## 2020-10-28 ENCOUNTER — Other Ambulatory Visit: Payer: Self-pay

## 2020-10-28 ENCOUNTER — Ambulatory Visit (INDEPENDENT_AMBULATORY_CARE_PROVIDER_SITE_OTHER): Payer: Medicare Other

## 2020-10-28 DIAGNOSIS — J4550 Severe persistent asthma, uncomplicated: Secondary | ICD-10-CM

## 2020-11-03 LAB — EXTERNAL GENERIC LAB PROCEDURE: COLOGUARD: POSITIVE — AB

## 2020-11-03 LAB — COLOGUARD: COLOGUARD: POSITIVE — AB

## 2020-11-04 LAB — COLOGUARD: Cologuard: POSITIVE — AB

## 2020-11-09 ENCOUNTER — Other Ambulatory Visit: Payer: Self-pay | Admitting: Orthopedic Surgery

## 2020-11-09 ENCOUNTER — Other Ambulatory Visit (HOSPITAL_COMMUNITY): Payer: Self-pay | Admitting: Orthopedic Surgery

## 2020-11-09 DIAGNOSIS — M25562 Pain in left knee: Secondary | ICD-10-CM

## 2020-11-15 ENCOUNTER — Other Ambulatory Visit: Payer: Self-pay | Admitting: Allergy & Immunology

## 2020-11-17 ENCOUNTER — Other Ambulatory Visit: Payer: Self-pay | Admitting: *Deleted

## 2020-11-17 DIAGNOSIS — E782 Mixed hyperlipidemia: Secondary | ICD-10-CM

## 2020-11-18 ENCOUNTER — Encounter (HOSPITAL_COMMUNITY): Payer: Self-pay

## 2020-11-18 ENCOUNTER — Ambulatory Visit (HOSPITAL_COMMUNITY): Payer: Medicare Other

## 2020-11-19 ENCOUNTER — Other Ambulatory Visit: Payer: Self-pay | Admitting: Allergy & Immunology

## 2020-12-08 ENCOUNTER — Other Ambulatory Visit: Payer: Self-pay | Admitting: Family Medicine

## 2020-12-08 DIAGNOSIS — F410 Panic disorder [episodic paroxysmal anxiety] without agoraphobia: Secondary | ICD-10-CM

## 2020-12-09 ENCOUNTER — Other Ambulatory Visit: Payer: Self-pay | Admitting: Family

## 2020-12-13 ENCOUNTER — Other Ambulatory Visit: Payer: Self-pay | Admitting: Allergy & Immunology

## 2020-12-18 ENCOUNTER — Telehealth: Payer: Self-pay | Admitting: *Deleted

## 2020-12-18 ENCOUNTER — Other Ambulatory Visit: Payer: Self-pay | Admitting: Family Medicine

## 2020-12-18 MED ORDER — LIDOCAINE 5 % EX OINT: 1.0000 "application " | TOPICAL_OINTMENT | CUTANEOUS | 0 refills | Status: DC | PRN

## 2020-12-18 NOTE — Telephone Encounter (Signed)
Fax from [facility] request for Licodaine 5% ointment Originally Rx by Dr. Charm Barges Last OV 08/21/20 Next OV 08/23/21 Please advise

## 2020-12-21 ENCOUNTER — Ambulatory Visit (INDEPENDENT_AMBULATORY_CARE_PROVIDER_SITE_OTHER): Payer: Medicare Other

## 2020-12-21 ENCOUNTER — Other Ambulatory Visit: Payer: Self-pay

## 2020-12-21 ENCOUNTER — Telehealth: Payer: Self-pay | Admitting: *Deleted

## 2020-12-21 DIAGNOSIS — J4550 Severe persistent asthma, uncomplicated: Secondary | ICD-10-CM

## 2020-12-21 MED ORDER — BENRALIZUMAB 30 MG/ML ~~LOC~~ SOSY
30.0000 mg | PREFILLED_SYRINGE | SUBCUTANEOUS | Status: DC
  Administered 2020-12-21 – 2021-08-04 (×5): 30 mg via SUBCUTANEOUS

## 2020-12-21 NOTE — Telephone Encounter (Signed)
Approvedtoday Your request has been approved  [facility] aware

## 2020-12-21 NOTE — Telephone Encounter (Signed)
Lidocaine 5% ointment Key: [id number] Sent to plan

## 2020-12-22 ENCOUNTER — Other Ambulatory Visit: Payer: Medicare Other

## 2020-12-22 DIAGNOSIS — E782 Mixed hyperlipidemia: Secondary | ICD-10-CM

## 2020-12-23 ENCOUNTER — Ambulatory Visit: Payer: Self-pay

## 2020-12-23 LAB — LIPID PANEL
Chol/HDL Ratio: 4 ratio (ref 0.0–4.4)
Cholesterol, Total: 189 mg/dL (ref 100–199)
HDL: 47 mg/dL (ref >39)
LDL Chol Calc (NIH): 100 mg/dL — ABNORMAL HIGH (ref 0–99)
Triglycerides: 248 mg/dL — ABNORMAL HIGH (ref 0–149)
VLDL Cholesterol Cal: 42 mg/dL — ABNORMAL HIGH (ref 5–40)

## 2020-12-24 ENCOUNTER — Telehealth: Payer: Self-pay | Admitting: Family Medicine

## 2020-12-24 NOTE — Telephone Encounter (Signed)
Patient aware and verbalized understanding. °

## 2021-01-05 ENCOUNTER — Telehealth: Payer: Self-pay | Admitting: Family Medicine

## 2021-01-05 ENCOUNTER — Other Ambulatory Visit: Payer: Self-pay | Admitting: Family Medicine

## 2021-01-05 DIAGNOSIS — R195 Other fecal abnormalities: Secondary | ICD-10-CM

## 2021-01-05 NOTE — Telephone Encounter (Signed)
Referral was already placed.  Please make sure lou ann saw it.

## 2021-01-06 ENCOUNTER — Encounter (INDEPENDENT_AMBULATORY_CARE_PROVIDER_SITE_OTHER): Payer: Self-pay | Admitting: *Deleted

## 2021-02-01 ENCOUNTER — Encounter: Payer: Self-pay | Admitting: Family

## 2021-02-01 ENCOUNTER — Ambulatory Visit (INDEPENDENT_AMBULATORY_CARE_PROVIDER_SITE_OTHER): Payer: Medicare Other | Admitting: Family

## 2021-02-01 VITALS — BP 129/87 | HR 72 | Temp 98.2°F | Ht 67.0 in | Wt 178.0 lb

## 2021-02-01 DIAGNOSIS — U071 COVID-19: Secondary | ICD-10-CM

## 2021-02-01 MED ORDER — MOLNUPIRAVIR EUA 200MG CAPSULE: 4.0000 | ORAL_CAPSULE | Freq: Two times a day (BID) | ORAL | 0 refills | Status: AC

## 2021-02-01 NOTE — Progress Notes (Signed)
Subjective:    Patient ID: [name], female    DOB: 1961-04-25, 60 y.o.   MRN: [id number]  Chief Complaint  Patient presents with   Joint Pain    Started Thursday + at home COVID Saturday    Fatigue   Headache   Diarrhea   PT presents to the office today with COVID. She reports her symptoms started Thursday and tested positive on Saturday.  Headache  This is a new problem. The current episode started in the past 7 days. The problem has been waxing and waning. Associated symptoms include a fever. Pertinent negatives include no coughing or vomiting.  Diarrhea  This is a new problem. The problem occurs 5 to 10 times per day. The stool consistency is described as Watery. Associated symptoms include arthralgias, a fever and headaches. Pertinent negatives include no chills, coughing or vomiting.     Review of Systems  Constitutional:  Positive for fever. Negative for chills.  Respiratory:  Negative for cough.   Gastrointestinal:  Positive for diarrhea. Negative for vomiting.  Musculoskeletal:  Positive for arthralgias.  Neurological:  Positive for headaches.  All other systems reviewed and are negative.     Objective:   Physical Exam Vitals reviewed.  Constitutional:      General: She is not in acute distress.    Appearance: She is well-developed.  HENT:     Head: Normocephalic and atraumatic.     Right Ear: External ear normal.  Eyes:     Pupils: Pupils are equal, round, and reactive to light.  Neck:     Thyroid: No thyromegaly.  Cardiovascular:     Rate and Rhythm: Normal rate and regular rhythm.     Heart sounds: Normal heart sounds. No murmur heard. Pulmonary:     Effort: Pulmonary effort is normal. No respiratory distress.     Breath sounds: Normal breath sounds. No wheezing.  Abdominal:     General: Bowel sounds are normal. There is no distension.     Palpations: Abdomen is soft.     Tenderness: There is no abdominal tenderness.  Musculoskeletal:         General: No tenderness. Normal range of motion.     Cervical back: Normal range of motion and neck supple.  Skin:    General: Skin is warm and dry.  Neurological:     Mental Status: She is alert and oriented to person, place, and time.     Cranial Nerves: No cranial nerve deficit.     Deep Tendon Reflexes: Reflexes are normal and symmetric.  Psychiatric:        Behavior: Behavior normal.        Thought Content: Thought content normal.        Judgment: Judgment normal.     BP 129/87   Pulse 72   Temp 98.2 F (36.8 C) (Temporal)   Ht 5\' 7"  (1.702 m)   Wt 178 lb (80.7 kg)   LMP 08/30/2002 (Approximate)   SpO2 93%   BMI 27.88 kg/m       Assessment & Plan:  [name] comes in today with chief complaint of Joint Pain (Started Thursday + at home COVID Saturday ), Fatigue, Headache, and Diarrhea   Diagnosis and orders addressed:  1. COVID-19 COVID positive, rest, force fluids, tylenol as needed, Quarantine for at least 5 days and fever free, report any worsening symptoms such as increased shortness of breath, swelling, or continued high fevers.  Imodium as needed  for diarrhea Possible adverse effects discussed  - molnupiravir EUA 200 mg CAPS; Take 4 capsules (800 mg total) by mouth 2 (two) times daily for 5 days.  Dispense: 40 capsule; Refill: 0   Jannifer Rodney, FNP

## 2021-02-01 NOTE — Patient Instructions (Signed)
10 Things You Can Do to Manage Your COVID-19 Symptoms at Home If you have possible or confirmed COVID-19 Stay home except to get medical care. Monitor your symptoms carefully. If your symptoms get worse, call your healthcare provider immediately. Get rest and stay hydrated. If you have a medical appointment, call the healthcare provider ahead of time and tell them that you have or may have COVID-19. For medical emergencies, call 911 and notify the dispatch personnel that you have or may have COVID-19. Cover your cough and sneezes with a tissue or use the inside of your elbow. Wash your hands often with soap and water for at least 20 seconds or clean your hands with an alcohol-based hand sanitizer that contains at least 60% alcohol. As much as possible, stay in a specific room and away from other people in your home. Also, you should use a separate bathroom, if available. If you need to be around other people in or outside of the home, wear a mask. Avoid sharing personal items with other people in your household, like dishes, towels, and bedding. Clean all surfaces that are touched often, like counters, tabletops, and doorknobs. Use household cleaning sprays or wipes according to the label instructions. cdc.gov/coronavirus 01/10/2020 This information is not intended to replace advice given to you by your health care provider. Make sure you discuss any questions you have with your healthcare provider. Document Revised: 07/31/2020 Document Reviewed: 07/31/2020 Elsevier Patient Education  2022 Elsevier Inc.  

## 2021-02-03 ENCOUNTER — Ambulatory Visit: Payer: Medicare Other | Admitting: Allergy & Immunology

## 2021-02-17 ENCOUNTER — Other Ambulatory Visit: Payer: Self-pay

## 2021-02-17 ENCOUNTER — Ambulatory Visit (INDEPENDENT_AMBULATORY_CARE_PROVIDER_SITE_OTHER): Payer: Medicare Other

## 2021-02-17 DIAGNOSIS — J4550 Severe persistent asthma, uncomplicated: Secondary | ICD-10-CM

## 2021-03-05 ENCOUNTER — Other Ambulatory Visit: Payer: Self-pay

## 2021-03-05 ENCOUNTER — Ambulatory Visit (INDEPENDENT_AMBULATORY_CARE_PROVIDER_SITE_OTHER): Payer: Medicare Other | Admitting: Allergy & Immunology

## 2021-03-05 DIAGNOSIS — J3089 Other allergic rhinitis: Secondary | ICD-10-CM | POA: Diagnosis not present

## 2021-03-05 DIAGNOSIS — J4550 Severe persistent asthma, uncomplicated: Secondary | ICD-10-CM

## 2021-03-05 DIAGNOSIS — J302 Other seasonal allergic rhinitis: Secondary | ICD-10-CM | POA: Diagnosis not present

## 2021-03-05 NOTE — Progress Notes (Signed)
FOLLOW UP  Date of Service/Encounter:  03/05/21   Assessment:   Moderate persistent asthma - doing well on Faserna today   Seasonal and perennial allergic rhinitis (indoor and outdoor molds, cat, dog, cockroach)   Chronic back pain - with a history of scoliosis (rods placement in 2010) and now on disability    History of ground glass opacities in 2016 - repeat chest CT markedly improved  Recent COVID-19 infection in August 2022  Plan/Recommendations:   1. Moderate persistent asthma, uncomplicated - Lung testing not done today since it was fine last time. - I think we are on a good course.  - Daily controller medication(s): Symbicort 160/4.61mcg two puffs once daily with spacer in the morning - Prior to physical activity: ProAir 2 puffs 10-15 minutes before physical activity. - Rescue medications: ProAir 4 puffs every 4-6 hours as needed or albuterol nebulizer one vial every 4-6 hours as needed - Changes during respiratory infections or worsening symptoms: Add on Asmanex 2 puffs twice daily for ONE TO TWO WEEKS. - Asthma control goals:  * Full participation in all desired activities (may need albuterol before activity) * Albuterol use two time or less a week on average (not counting use with activity) * Cough interfering with sleep two time or less a month * Oral steroids no more than once a year * No hospitalizations  2. Chronic rhinitis (weeds, indoor molds, outdoor molds, cat, dog, and cockroach) - Continue with fluticasone nasal spray 1-2 sprays per nostril daily. - Continue with Astelin 2 sprays per nostril up to twice daily as needed.  - Continue with cetirizine 10mg  daily.  3. Return in about 6 months (around 09/02/2021).   Subjective:   [name] is a 60 y.o. female presenting today for follow up of No chief complaint on file.   [name] has a history of the following: Patient Active Problem List   Diagnosis Date Noted   Varicose veins of  bilateral lower extremities with pain 01/02/2020   Seasonal and perennial allergic rhinitis 04/15/2018   Fusion of spine, thoracolumbar region 11/29/2016   Chronic insomnia 11/29/2016   Eosinophilia 03/29/2016   Moderate persistent asthma, uncomplicated 03/29/2016   Mixed rhinitis 03/29/2016   Other social stressor 03/29/2016   Chronic migraine w/o aura w/o status migrainosus, not intractable 03/14/2016   Arthritis of knee, degenerative 07/09/2015   Chronic low back pain 04/15/2015   Cough variant asthma 08/09/2014   Pulmonary infiltrates with eosinophilia (HCC) 08/09/2014    History obtained from: chart review and patient.  [name] is a 60 y.o. female presenting for a follow up visit.  She was last seen in April 2022.  At that time, her lung testing looks much better.  We decreased her Symbicort to 2 puffs in the morning to see how she did with that.  We continued with May 2022.  For her rhinitis, would continue with Flonase and Astelin as well as cetirizine.  She caught COVID19 in early August. She did not have to go to the hospital. She did not get Paxlovid. She went to see her PCP. She did molnupiravir four capsules twice daily for five days. She is unsure if it helped. She is NOT vaccinated. She has not gotten around to it. She had some leg myalgias prior to this.   She had a Cologard that came back positive. She is going to follow up with a doctor about that. It took a while for the test to get picked  up.  Asthma/Respiratory Symptom History: She is on the Symbicort two puffs once daily in the morning. She is paying $8 per copay for her Symbicort. She has been on Wrens every two months. This is going very well.  She has not required use of her rescue medication. [name]'s asthma has been well controlled. She has not required rescue medication, experienced nocturnal awakenings due to lower respiratory symptoms, nor have activities of daily living been limited. She has required no Emergency  Department or Urgent Care visits for her asthma. She has required zero courses of systemic steroids for asthma exacerbations since the last visit. ACT score today is 23, indicating excellent asthma symptom control.   Allergic Rhinitis Symptom History: She remains on her cetirizine daily.  She is using the nose spray as well as once a day.  This seems to be working well to control her symptoms.  She has not needed antibiotics.  She is now working [employer] part time.  Evidently, they have been cutting everybody's hours.  This seems to be more of a benefit saving cost move.  Her back pain is stable.    Otherwise, there have been no changes to her past medical history, surgical history, family history, or social history.    Review of Systems  Constitutional: Negative.  Negative for chills, fever, malaise/fatigue and weight loss.  HENT: Negative.  Negative for congestion, ear discharge and ear pain.   Eyes:  Negative for pain, discharge and redness.  Respiratory:  Negative for cough, sputum production, shortness of breath and wheezing.   Cardiovascular: Negative.  Negative for chest pain and palpitations.  Gastrointestinal:  Negative for abdominal pain, heartburn, nausea and vomiting.  Musculoskeletal:  Positive for back pain.  Skin: Negative.  Negative for itching and rash.  Neurological:  Negative for dizziness and headaches.  Endo/Heme/Allergies:  Negative for environmental allergies. Does not bruise/bleed easily.      Objective:   Vitals reviewed and all within normal limits.   Physical Exam:  Physical Exam Vitals reviewed.  Constitutional:      Appearance: She is well-developed.  HENT:     Head: Normocephalic and atraumatic.     Right Ear: Tympanic membrane, ear canal and external ear normal.     Left Ear: Tympanic membrane, ear canal and external ear normal.     Nose: No nasal deformity, septal deviation, mucosal edema or rhinorrhea.     Right Turbinates: Enlarged and  swollen.     Left Turbinates: Enlarged and swollen.     Right Sinus: No maxillary sinus tenderness or frontal sinus tenderness.     Left Sinus: No maxillary sinus tenderness or frontal sinus tenderness.     Mouth/Throat:     Mouth: Mucous membranes are not pale and not dry.     Pharynx: Uvula midline.  Eyes:     General: Lids are normal. No allergic shiner.       Right eye: No discharge.        Left eye: No discharge.     Conjunctiva/sclera: Conjunctivae normal.     Right eye: Right conjunctiva is not injected. No chemosis.    Left eye: Left conjunctiva is not injected. No chemosis.    Pupils: Pupils are equal, round, and reactive to light.  Cardiovascular:     Rate and Rhythm: Normal rate and regular rhythm.     Heart sounds: Normal heart sounds.  Pulmonary:     Effort: Pulmonary effort is normal. No tachypnea, accessory muscle usage  or respiratory distress.     Breath sounds: Normal breath sounds. No wheezing, rhonchi or rales.     Comments: Moving air well in all lung fields.  No increased work of breathing. Chest:     Chest wall: No tenderness.  Lymphadenopathy:     Cervical: No cervical adenopathy.  Skin:    General: Skin is warm.     Capillary Refill: Capillary refill takes less than 2 seconds.     Coloration: Skin is not pale.     Findings: No abrasion, erythema, petechiae or rash. Rash is not papular, urticarial or vesicular.     Comments: No eczematous or urticarial lesions noted.  Neurological:     Mental Status: She is alert.  Psychiatric:        Behavior: Behavior is cooperative.     Diagnostic studies: none       Malachi Bonds, MD  [facility]

## 2021-03-05 NOTE — Patient Instructions (Addendum)
1. Moderate persistent asthma, uncomplicated - Lung testing not done today since it was fine last time. - I think we are on a good course.  - Daily controller medication(s): Symbicort 160/4.75mcg two puffs once daily with spacer in the morning - Prior to physical activity: ProAir 2 puffs 10-15 minutes before physical activity. - Rescue medications: ProAir 4 puffs every 4-6 hours as needed or albuterol nebulizer one vial every 4-6 hours as needed - Changes during respiratory infections or worsening symptoms: Add on Asmanex 2 puffs twice daily for ONE TO TWO WEEKS. - Asthma control goals:  * Full participation in all desired activities (may need albuterol before activity) * Albuterol use two time or less a week on average (not counting use with activity) * Cough interfering with sleep two time or less a month * Oral steroids no more than once a year * No hospitalizations  2. Chronic rhinitis (weeds, indoor molds, outdoor molds, cat, dog, and cockroach) - Continue with fluticasone nasal spray 1-2 sprays per nostril daily. - Continue with Astelin 2 sprays per nostril up to twice daily as needed.  - Continue with cetirizine 10mg  daily.  3. Return in about 6 months (around 09/02/2021).    Please inform 11/02/2021 of any Emergency Department visits, hospitalizations, or changes in symptoms. Call us before going to the ED for breathing or allergy symptoms since we might be able to fit you in for a sick visit. Feel free to contact us anytime with any questions, problems, or concerns.  It was a pleasure to see you again today!  Websites that have reliable patient information: 1. American Academy of Asthma, Allergy, and Immunology: www.aaaai.org 2. Food Allergy Research and Education (FARE): foodallergy.org 3. Mothers of Asthmatics: http://www.asthmacommunitynetwork.org 4. American College of Allergy, Asthma, and Immunology: www.acaai.org   COVID-19 Vaccine Information can be found at:  [place] For questions related to vaccine distribution or appointments, please email [email or web address] or call [phone].   We realize that you might be concerned about having an allergic reaction to the COVID19 vaccines. To help with that concern, WE ARE OFFERING THE COVID19 VACCINES IN OUR OFFICE! Ask the front desk for dates!     "Like" [phone] on Facebook and Instagram for our latest updates!      A healthy democracy works best when [place] participate! Make sure you are registered to vote! If you have moved or changed any of your contact information, you will need to get this updated before voting!  In some cases, you MAY be able to register to vote online: [employer]

## 2021-03-08 ENCOUNTER — Encounter: Payer: Self-pay | Admitting: Allergy & Immunology

## 2021-04-05 ENCOUNTER — Other Ambulatory Visit: Payer: Self-pay | Admitting: *Deleted

## 2021-04-05 MED ORDER — FASENRA 30 MG/ML ~~LOC~~ SOSY: 30.0000 mg | PREFILLED_SYRINGE | SUBCUTANEOUS | 6 refills | Status: DC

## 2021-04-09 ENCOUNTER — Encounter: Payer: Self-pay | Admitting: Family Medicine

## 2021-04-09 ENCOUNTER — Ambulatory Visit (INDEPENDENT_AMBULATORY_CARE_PROVIDER_SITE_OTHER): Payer: Medicare Other | Admitting: Family Medicine

## 2021-04-09 ENCOUNTER — Other Ambulatory Visit: Payer: Self-pay

## 2021-04-09 VITALS — BP 132/86 | HR 79 | Temp 97.4°F | Ht 67.0 in | Wt 185.4 lb

## 2021-04-09 DIAGNOSIS — M353 Polymyalgia rheumatica: Secondary | ICD-10-CM | POA: Diagnosis not present

## 2021-04-09 DIAGNOSIS — R5382 Chronic fatigue, unspecified: Secondary | ICD-10-CM

## 2021-04-09 DIAGNOSIS — M6281 Muscle weakness (generalized): Secondary | ICD-10-CM

## 2021-04-09 DIAGNOSIS — H9313 Tinnitus, bilateral: Secondary | ICD-10-CM

## 2021-04-09 NOTE — Progress Notes (Signed)
Subjective: CC: Muscle weakness PCP: Janora Norlander, DO [name] is a 60 y.o. female presenting to clinic today for:  1.  Muscle weakness Patient reports that she has had muscle pain and weakness of the lower extremities ever since being infected with COVID-19.  She notes that her upper legs feel heavy and they often hurt.  She has difficulty rising from a seated position.  No preceding injury.  She did have a left knee surgery a couple days prior to being infected with COVID-19.  She has had ongoing swelling of the left knee but this has been addressed by her orthopedist already.  No reports of change in bowel habit but she does report fatigue.  2.  Tinnitus Patient reports that she is been suffering from tinnitus in the right ear worse than the left ear since being infected with COVID-19.  She has a history of severe ear infection in the right ear and this was addressed by Dr. Benjamine Mola previously when he was still working in [place].  She would like to see him again for this   ROS: Per HPI  No Known Allergies Past Medical History:  Diagnosis Date   Arthritis    Spine, R knee   Asthma    Back pain    Cough 09/03/2014   Followed in Pulmonary clinic/ [facility]/ [place] - sinus CT 09/08/2014> Mild mucosal thickening involves the paranasal sinuses  - Eos 1.2  08/06/14 - Allergy profile 11/27/14 >  Eos 1.0/ IgE  41 with neg RAST     GERD (gastroesophageal reflux disease)    History of blood transfusion    Back surgery    Current Outpatient Medications:    albuterol (PROVENTIL) (2.5 MG/3ML) 0.083% nebulizer solution, NEBULIZE ONE AMPULE EVERY 4 HOURS AS NEEDED FOR COUGH OR WHEEZE -, Disp: 90 mL, Rfl: 1   albuterol (VENTOLIN HFA) 108 (90 Base) MCG/ACT inhaler, INHALE TWO PUFFS INTO LUNGS EVERY 4 HOURS AS NEEDED FOR WHEEZING OR SHORTNESS OF BREATH, Disp: 18 g, Rfl: 1   azelastine (ASTELIN) 0.1 % nasal spray, INSTILL 2 SPRAYS IN EACH NOSTRIL TWICE DAILY AS NEEDED, Disp: 30 mL,  Rfl: 2   Benralizumab (FASENRA) 30 MG/ML SOSY, Inject 1 mL (30 mg total) into the skin every 8 (eight) weeks., Disp: 1 mL, Rfl: 6   diclofenac (VOLTAREN) 75 MG EC tablet, Take 75 mg by mouth 2 (two) times daily with a meal., Disp: , Rfl: 2   EPINEPHrine 0.3 mg/0.3 mL IJ SOAJ injection, Inject into the muscle., Disp: , Rfl:    fluticasone (FLONASE) 50 MCG/ACT nasal spray, INSTILL 2 SPRAYS INTO EACH NOSTRIL TWICE DAILY, Disp: 16 g, Rfl: 4   hydrOXYzine (ATARAX/VISTARIL) 50 MG tablet, TAKE 1/2 TO 1 TABLET BY MOUTH EVERY 8 HOURS AS NEEDED FOR ANXIETY, Disp: 30 tablet, Rfl: prn   ibuprofen (ADVIL) 600 MG tablet, Take 1 tablet (600 mg total) by mouth every 8 (eight) hours as needed., Disp: 30 tablet, Rfl: 0   ibuprofen (ADVIL) 800 MG tablet, Take 800 mg by mouth 3 (three) times daily as needed., Disp: , Rfl:    levocetirizine (XYZAL) 5 MG tablet, TAKE 1 TABLET BY MOUTH EVERY MORNING, Disp: 90 tablet, Rfl: 2   lidocaine (XYLOCAINE) 5 % ointment, Apply 1 application topically as needed., Disp: 35.44 g, Rfl: 0   montelukast (SINGULAIR) 10 MG tablet, TAKE 1 TABLET BY MOUTH AT BEDTIME, Disp: 30 tablet, Rfl: 3   morphine (MSIR) 15 MG tablet, Take 15  mg by mouth every 8 (eight) hours as needed. Back Pain, Disp: , Rfl:    pantoprazole (PROTONIX) 40 MG tablet, TAKE 1 TABLET BY MOUTH DAILY, Disp: 30 tablet, Rfl: 2   SYMBICORT 160-4.5 MCG/ACT inhaler, INHALE TWO PUFFS INTO THE LUNGS TWICE DAILY, Disp: 10.2 g, Rfl: 5   tiZANidine (ZANAFLEX) 4 MG tablet, Take 4 mg by mouth 2 (two) times daily as needed., Disp: , Rfl:    ARNUITY ELLIPTA 100 MCG/ACT AEPB, Inhale 1 puff into the lungs daily. For 1-2 weeks during signs and symptoms of upper respiratory infection. (Patient not taking: No sig reported), Disp: 30 each, Rfl: 2   ipratropium-albuterol (DUONEB) 0.5-2.5 (3) MG/3ML SOLN, Take 3 mLs by nebulization every 4 (four) hours as needed., Disp: 360 mL, Rfl: 0  Current Facility-Administered Medications:    Benralizumab  SOSY 30 mg, 30 mg, Subcutaneous, Q8 Weeks, Valentina Shaggy, MD, 30 mg at 02/17/21 0840 Social History   Socioeconomic History   Marital status: Significant Other    Spouse name: Ricky   Number of children: 2   Years of education: 12   Highest education level: Not on file  Occupational History   Occupation: disabled    Comment: back  Tobacco Use   Smoking status: Never   Smokeless tobacco: Never  Vaping Use   Vaping Use: Never used  Substance and Sexual Activity   Alcohol use: No   Drug use: No   Sexual activity: Yes    Birth control/protection: Surgical  Other Topics Concern   Not on file  Social History Narrative   Lives with [name] - 81 years   [name] has end stage colon cancer   Two sons/ grandchildren   Disabled from back pain/had scoliosis. Has had back reconstruction.    Dr. Hulda Marin doctor.    Social Determinants of Health   Financial Resource Strain: Low Risk    Difficulty of Paying Living Expenses: Not hard at all  Food Insecurity: No Food Insecurity   Worried About Charity fundraiser in the Last Year: Never true   [place] in the Last Year: Never true  Transportation Needs: No Transportation Needs   Lack of Transportation (Medical): No   Lack of Transportation (Non-Medical): No  Physical Activity: Sufficiently Active   Days of Exercise per Week: 3 days   Minutes of Exercise per Session: 60 min  Stress: No Stress Concern Present   Feeling of Stress : Only a little  Social Connections: Moderately Integrated   Frequency of Communication with Friends and Family: More than three times a week   Frequency of Social Gatherings with Friends and Family: Once a week   Attends Religious Services: More than 4 times per year   Active Member of [employer] or Organizations: No   Attends Music therapist: Never   Marital Status: Living with partner  Intimate Partner Violence: Not At Risk   Fear of Current or Ex-Partner: No   Emotionally Abused: No    Physically Abused: No   Sexually Abused: No   Family History  Problem Relation Age of Onset   Emphysema Maternal Grandmother        never smoker   COPD Maternal Grandmother    Emphysema Maternal Uncle        smoked   COPD Maternal Uncle    Arthritis Mother    Diabetes Mother    Hyperlipidemia Mother    Hypertension Mother    Early death Father 29  MVA   Asthma Sister    Asthma Brother    Heart disease Maternal Grandfather     Objective: Office vital signs reviewed. BP 132/86   Pulse 79   Temp (!) 97.4 F (36.3 C)   Ht '5\' 7"'  (1.702 m)   Wt 185 lb 6.4 oz (84.1 kg)   LMP 08/30/2002 (Approximate)   SpO2 95%   BMI 29.04 kg/m   Physical Examination:  General: Awake, alert, well nourished, No acute distress HEENT: No exophthalmos.  No goiter Extremities: warm, well perfused, No edema, cyanosis or clubbing; +2 pulses bilaterally MSK: Normal gait and station.  Ambulating independently.  She is able to rise from the seated position without assistance.  No tenderness palpation reproducible on exam Neuro: 4/5 LE Strength and light touch sensation grossly intact   Assessment/ Plan: 60 y.o. female   Polymyalgia ([place]) - Plan: ANA w/Reflex if Positive, C-reactive protein, CMP14+EGFR, CBC, Vitamin B12, Sedimentation Rate, CK  Muscle weakness - Plan: ANA w/Reflex if Positive, C-reactive protein, CMP14+EGFR, CBC, Vitamin B12, Sedimentation Rate, CK  Chronic fatigue - Plan: ANA w/Reflex if Positive, C-reactive protein, CMP14+EGFR, CBC, TSH, T4, Free, Vitamin B12, Sedimentation Rate, CK  Tinnitus of both ears - Plan: Ambulatory referral to ENT  Very concerned for a possible undiagnosed polymyalgia rheumatica given reports of girdle fatigue and weakness.  I am going to look for autoimmune component but have also added thyroid, B12 and CK.  There is a possibility that she simply exhibiting postviral fatigue syndrome as well given COVID-19 history.  Her physical exam was fairly  unremarkable.  Ongoing tinnitus status post COVID-19.  Suspect that she will need to pursue normal treatments for tinnitus including use of white noise etc. but given history of severe right-sided ear infection, I have placed referral to ENT for further evaluation per her request.  She is previously seen Dr. Benjamine Mola  Orders Placed This Encounter  Procedures   ANA w/Reflex if Positive   C-reactive protein   CMP14+EGFR   CBC   TSH   T4, Free   Vitamin B12   Sedimentation Rate   CK   No orders of the defined types were placed in this encounter.    Janora Norlander, DO [facility] [phone]

## 2021-04-13 ENCOUNTER — Ambulatory Visit: Payer: Medicare Other | Admitting: Nutrition

## 2021-04-13 ENCOUNTER — Telehealth: Payer: Self-pay | Admitting: Nutrition

## 2021-04-13 LAB — CMP14+EGFR
ALT: 36 IU/L — ABNORMAL HIGH (ref 0–32)
AST: 25 IU/L (ref 0–40)
Albumin/Globulin Ratio: 2 (ref 1.2–2.2)
Albumin: 4.5 g/dL (ref 3.8–4.9)
Alkaline Phosphatase: 117 IU/L (ref 44–121)
BUN/Creatinine Ratio: 18 (ref 9–23)
BUN: 16 mg/dL (ref 6–24)
Bilirubin Total: 0.4 mg/dL (ref 0.0–1.2)
CO2: 24 mmol/L (ref 20–29)
Calcium: 9.5 mg/dL (ref 8.7–10.2)
Chloride: 103 mmol/L (ref 96–106)
Creatinine, Ser: 0.87 mg/dL (ref 0.57–1.00)
Globulin, Total: 2.2 g/dL (ref 1.5–4.5)
Glucose: 103 mg/dL — ABNORMAL HIGH (ref 70–99)
Potassium: 4.2 mmol/L (ref 3.5–5.2)
Sodium: 141 mmol/L (ref 134–144)
Total Protein: 6.7 g/dL (ref 6.0–8.5)
eGFR: 77 mL/min/{1.73_m2} (ref >59)

## 2021-04-13 LAB — TSH: TSH: 2.37 u[IU]/mL (ref 0.450–4.500)

## 2021-04-13 LAB — CBC
Hematocrit: 40 % (ref 34.0–46.6)
Hemoglobin: 13.7 g/dL (ref 11.1–15.9)
MCH: 30.4 pg (ref 26.6–33.0)
MCHC: 34.3 g/dL (ref 31.5–35.7)
MCV: 89 fL (ref 79–97)
Platelets: 238 10*3/uL (ref 150–450)
RBC: 4.51 x10E6/uL (ref 3.77–5.28)
RDW: 12.3 % (ref 11.7–15.4)
WBC: 5.9 10*3/uL (ref 3.4–10.8)

## 2021-04-13 LAB — SEDIMENTATION RATE: Sed Rate: 7 mm/hr (ref 0–40)

## 2021-04-13 LAB — ANA W/REFLEX IF POSITIVE: Anti Nuclear Antibody (ANA): NEGATIVE

## 2021-04-13 LAB — CK: Total CK: 122 U/L (ref 32–182)

## 2021-04-13 LAB — C-REACTIVE PROTEIN: CRP: 7 mg/L (ref 0–10)

## 2021-04-13 LAB — T4, FREE: Free T4: 1.4 ng/dL (ref 0.82–1.77)

## 2021-04-13 LAB — VITAMIN B12: Vitamin B-12: 735 pg/mL (ref 232–1245)

## 2021-04-13 NOTE — Telephone Encounter (Signed)
VM to call and r/s missed appt.

## 2021-04-14 ENCOUNTER — Ambulatory Visit (INDEPENDENT_AMBULATORY_CARE_PROVIDER_SITE_OTHER): Payer: Medicare Other

## 2021-04-14 ENCOUNTER — Other Ambulatory Visit: Payer: Self-pay | Admitting: Allergy & Immunology

## 2021-04-14 ENCOUNTER — Other Ambulatory Visit: Payer: Self-pay

## 2021-04-14 DIAGNOSIS — J4550 Severe persistent asthma, uncomplicated: Secondary | ICD-10-CM

## 2021-04-16 ENCOUNTER — Other Ambulatory Visit: Payer: Self-pay | Admitting: Allergy & Immunology

## 2021-04-16 ENCOUNTER — Telehealth: Payer: Self-pay | Admitting: Family Medicine

## 2021-04-16 DIAGNOSIS — I8393 Asymptomatic varicose veins of bilateral lower extremities: Secondary | ICD-10-CM

## 2021-04-16 NOTE — Telephone Encounter (Signed)
REFERRAL REQUEST Telephone Note  Have you been seen at our office for this problem? Yes last year--needs new referral (Advise that they may need an appointment with their PCP before a referral can be done)  Reason for Referral: legs hurting and compression hose  Referral discussed with patient:  Best contact number of patient for referral team:   [phone]  Has patient been seen by a specialist for this issue before: yes, last year  Patient provider preference for referral: [facility] in [place]. Pt seen specialist for this issue last year. She says that Nadine Counts is aware. She went out to be fitted for compression hose and was told by a nurse to get a new referral. I asked for nurse name and she did not remember.   Patient location preference for referral:    Patient notified that referrals can take up to a week or longer to process. If they haven't heard anything within a week they should call back and speak with the referral department.

## 2021-04-19 ENCOUNTER — Other Ambulatory Visit: Payer: Self-pay | Admitting: Family Medicine

## 2021-04-19 DIAGNOSIS — I83813 Varicose veins of bilateral lower extremities with pain: Secondary | ICD-10-CM

## 2021-04-19 NOTE — Telephone Encounter (Signed)
done

## 2021-04-22 ENCOUNTER — Encounter: Payer: Self-pay | Admitting: Family Medicine

## 2021-05-05 ENCOUNTER — Other Ambulatory Visit: Payer: Self-pay | Admitting: *Deleted

## 2021-05-05 DIAGNOSIS — I83813 Varicose veins of bilateral lower extremities with pain: Secondary | ICD-10-CM

## 2021-05-11 ENCOUNTER — Encounter (INDEPENDENT_AMBULATORY_CARE_PROVIDER_SITE_OTHER): Payer: Self-pay

## 2021-05-11 ENCOUNTER — Telehealth (INDEPENDENT_AMBULATORY_CARE_PROVIDER_SITE_OTHER): Payer: Self-pay

## 2021-05-11 ENCOUNTER — Other Ambulatory Visit (INDEPENDENT_AMBULATORY_CARE_PROVIDER_SITE_OTHER): Payer: Self-pay

## 2021-05-11 ENCOUNTER — Encounter (INDEPENDENT_AMBULATORY_CARE_PROVIDER_SITE_OTHER): Payer: Self-pay | Admitting: Internal Medicine

## 2021-05-11 ENCOUNTER — Ambulatory Visit (INDEPENDENT_AMBULATORY_CARE_PROVIDER_SITE_OTHER): Payer: Medicare Other | Admitting: Internal Medicine

## 2021-05-11 ENCOUNTER — Other Ambulatory Visit: Payer: Self-pay

## 2021-05-11 DIAGNOSIS — R1319 Other dysphagia: Secondary | ICD-10-CM

## 2021-05-11 DIAGNOSIS — R195 Other fecal abnormalities: Secondary | ICD-10-CM | POA: Diagnosis not present

## 2021-05-11 DIAGNOSIS — K219 Gastro-esophageal reflux disease without esophagitis: Secondary | ICD-10-CM

## 2021-05-11 MED ORDER — PEG 3350-KCL-NA BICARB-NACL 420 G PO SOLR: 4000.0000 mL | ORAL | 0 refills | Status: DC

## 2021-05-11 NOTE — Telephone Encounter (Signed)
[name], CMA  

## 2021-05-11 NOTE — H&P (View-Only) (Signed)
Presenting complaint;  Positive Cologuard test. Swallowing difficulty.  History of present illness.  Patient is 60-year-old Caucasian female who is referred through courtesy of Dr. Erick Blinks for GI evaluation. She had Cologuard back in July 2018 and was negative.  She had another Cologuard in May this year and it came back positive.  She denies abdominal pain change in bowel habits melena or rectal bleeding.  Her appetite is good and her weight has been stable. She says heartburn is well controlled with medication but she has been experiencing dysphagia to solids for the last 1 year.  Is been occurring more frequently lately and she may have an an episode of 2 every week.  She points to suprasternal area sort of bolus obstruction.  Food bolus always passes down.  She has not had any episode of food impaction relieved with regurgitation. She says she has been on therapy for GERD for at least 8 years.  Her upper GI tract has never been evaluated. Patient has pain 24/7 and she feels she cannot function without NSAIDs.  She is also on morphine for pain control. Similarly she has never undergone colonoscopy. Family history is negative for colorectal carcinoma.   urrent Medications: Outpatient Encounter Medications as of 05/11/2021  Medication Sig   albuterol (PROVENTIL) (2.5 MG/3ML) 0.083% nebulizer solution NEBULIZE ONE AMPULE EVERY 4 HOURS AS NEEDED FOR COUGH OR WHEEZE -   albuterol (VENTOLIN HFA) 108 (90 Base) MCG/ACT inhaler INHALE TWO PUFFS INTO LUNGS EVERY 4 HOURS AS NEEDED FOR WHEEZING OR SHORTNESS OF BREATH   azelastine (ASTELIN) 0.1 % nasal spray INSTILL 2 SPRAYS IN EACH NOSTRIL TWICE DAILY AS NEEDED   Benralizumab (FASENRA) 30 MG/ML SOSY Inject 1 mL (30 mg total) into the skin every 8 (eight) weeks.   diclofenac (VOLTAREN) 75 MG EC tablet Take 75 mg by mouth 2 (two) times daily with a meal.   EPINEPHrine 0.3 mg/0.3 mL IJ SOAJ injection Inject into the muscle.   fluticasone  (FLONASE) 50 MCG/ACT nasal spray INSTILL 2 SPRAYS INTO EACH NOSTRIL TWICE DAILY   hydrOXYzine (ATARAX/VISTARIL) 50 MG tablet TAKE 1/2 TO 1 TABLET BY MOUTH EVERY 8 HOURS AS NEEDED FOR ANXIETY   ibuprofen (ADVIL) 800 MG tablet Take 800 mg by mouth 3 (three) times daily as needed.   ipratropium-albuterol (DUONEB) 0.5-2.5 (3) MG/3ML SOLN Take 3 mLs by nebulization every 4 (four) hours as needed.   levocetirizine (XYZAL) 5 MG tablet TAKE 1 TABLET BY MOUTH EVERY MORNING   lidocaine (XYLOCAINE) 5 % ointment Apply 1 application topically as needed.   montelukast (SINGULAIR) 10 MG tablet TAKE 1 TABLET BY MOUTH AT BEDTIME   morphine (MSIR) 15 MG tablet Take 15 mg by mouth every 8 (eight) hours as needed. Back Pain   pantoprazole (PROTONIX) 40 MG tablet TAKE 1 TABLET BY MOUTH DAILY   SYMBICORT 160-4.5 MCG/ACT inhaler INHALE TWO PUFFS INTO THE LUNGS TWICE DAILY   tiZANidine (ZANAFLEX) 4 MG tablet Take 4 mg by mouth 2 (two) times daily as needed.   [DISCONTINUED] ARNUITY ELLIPTA 100 MCG/ACT AEPB Inhale 1 puff into the lungs daily. For 1-2 weeks during signs and symptoms of upper respiratory infection. (Patient not taking: No sig reported)   [DISCONTINUED] ibuprofen (ADVIL) 600 MG tablet Take 1 tablet (600 mg total) by mouth every 8 (eight) hours as needed.   Facility-Administered Encounter Medications as of 05/11/2021  Medication   Benralizumab SOSY 30 mg   Past medical history  Chronic GERD.  She has been on  PPI for at least 8 years. Bronchial asthma since 2016.  She had eosinophilia.  She has done very well since she has been on benralizumab. Hysterectomy with removal of 1 ovary in 2004. History of scoliosis.  She had lumbar spine fusion in 2010.  She had repeat surgery in 2014 when some of the hardware was removed.  She has constant pain. Bilateral knee osteoarthrosis.  She had right knee arthroscopy in 2017 and left 1 in June 2022.   Allergies  No Known Allergies  Family history  Father died  in auto accident at age 95. Mother is 35 years old and is doing fine.  She has had bilateral hip surgeries. She has a brother age 70 in poor health.  He has external heart pump in place. She has 2 sisters ages 86 and 33.  Older sister is asthma and younger sister is in good health.  Social history  First marriage ended in divorce.  She has 2 sons ages 63 and 42 from first marriage.  They are both in good health.  She has been in long-term relationship but not legally married normal.  She has been on disability secondary to back problems and chronic pain.  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  She has never smoked cigarettes and does not drink alcohol.  She goes to gym 2-3 times a week for exercise and able to walk some.  Physical examination  Blood pressure 134/85, pulse 80, temperature 98.3 F (36.8 C), temperature source Oral, height 5' 7" (1.702 m), weight 185 lb (83.9 kg), last menstrual period 08/30/2002. Patient is alert and in no acute distress. Conjunctiva is pink. Sclera is nonicteric Oropharyngeal mucosa is normal. No neck masses or thyromegaly noted. Cardiac exam with regular rhythm normal S1 and S2. No murmur or gallop noted. Lungs are clear to auscultation. Abdomen is symmetrical soft and nontender without organomegaly or masses. No LE edema or clubbing noted.  Labs/studies Results:   CBC Latest Ref Rng & Units 04/09/2021 07/27/2020 08/28/2019  WBC 3.4 - 10.8 x10E3/uL 5.9 6.6 6.3  Hemoglobin 11.1 - 15.9 g/dL 13.7 14.7 14.9  Hematocrit 34.0 - 46.6 % 40.0 44.7 43.7  Platelets 150 - 450 x10E3/uL 238 274 240    CMP Latest Ref Rng & Units 04/09/2021 08/21/2020 09/12/2019  Glucose 70 - 99 mg/dL 103(H) 93 82  BUN 6 - 24 mg/dL _0 Creatinine 0.57 - 1.00 mg/dL 0.87 0.79 0.67  Sodium 134 - 144 mmol/L 141 141 142  Potassium 3.5 - 5.2 mmol/L 4.2 4.4 4.0  Chloride 96 - 106 mmol/L 103 100 102  CO2 20 - 29 mmol/L _1 Calcium 8.7 - 10.2 mg/dL 9.5 9.8 8.7   Total Protein 6.0 - 8.5 g/dL 6.7 6.7 6.0  Total Bilirubin 0.0 - 1.2 mg/dL 0.4 0.3 0.2  Alkaline Phos 44 - 121 IU/L 117 117 105  AST 0 - 40 IU/L _2 ALT 0 - 32 IU/L 36(H) 24 25    Hepatic Function Latest Ref Rng & Units 04/09/2021 08/21/2020 09/12/2019  Total Protein 6.0 - 8.5 g/dL 6.7 6.7 6.0  Albumin 3.8 - 4.9 g/dL 4.5 4.4 3.8  AST 0 - 40 IU/L _3 ALT 0 - 32 IU/L 36(H) 24 25  Alk Phosphatase 44 - 121 IU/L 117 117 105  Total Bilirubin 0.0 - 1.2 mg/dL 0.4 0.3 0.2    Recent lab data reviewed.  Assessment:  #  1.  Positive Cologuard test.  She had a positive test in May 2022.  Test and July 2018 was negative.  Family history is negative for colorectal carcinoma.  She does not have any worrisome symptoms.  Will need to rule out colonic adenomas or neoplasm.  We will proceed with diagnostic colonoscopy.  #2.  Esophageal dysphagia.  She has had dysphagia for at least a year primarily to solids.  In the setting of chronic GERD she could have Schatzki's ring or esophageal stricture.  #3.  Chronic GERD.  She is doing well with therapy.  Need to rule out Barrett's esophagus given chronicity of symptoms.  #4.  Borderline ALT of 36.  No history of liver disease.  Prior transaminases have been normal.  We will plan to repeat LFTs at the time of endoscopy before considering further work-up.   Plan:  Diagnostic esophagogastroduodenoscopy with esophageal dilation. Diagnostic colonoscopy in near future. Both procedures will be performed under monitored anesthesia care. Will check LFTs at the time of endoscopic evaluation. Further recommendations to follow.      

## 2021-05-11 NOTE — Patient Instructions (Signed)
Esophagogastroduodenoscopy with esophageal dilation and colonoscopy to be scheduled in near future. 

## 2021-05-11 NOTE — Progress Notes (Signed)
Presenting complaint;  Positive Cologuard test. Swallowing difficulty.  History of present illness.  Patient is 60-ish-year-old Caucasian female who is referred through courtesy of Dr. Ashley Gottsschalk for GI evaluation. She had Cologuard back in July 2018 and was negative.  She had another Cologuard in May this year and it came back positive.  She denies abdominal pain change in bowel habits melena or rectal bleeding.  Her appetite is good and her weight has been stable. She says heartburn is well controlled with medication but she has been experiencing dysphagia to solids for the last 1 year.  Is been occurring more frequently lately and she may have an an episode of 2 every week.  She points to suprasternal area sort of bolus obstruction.  Food bolus always passes down.  She has not had any episode of food impaction relieved with regurgitation. She says she has been on therapy for GERD for at least 8 years.  Her upper GI tract has never been evaluated. Patient has pain 24/7 and she feels she cannot function without NSAIDs.  She is also on morphine for pain control. Similarly she has never undergone colonoscopy. Family history is negative for colorectal carcinoma.   urrent Medications: Outpatient Encounter Medications as of 05/11/2021  Medication Sig   albuterol (PROVENTIL) (2.5 MG/3ML) 0.083% nebulizer solution NEBULIZE ONE AMPULE EVERY 4 HOURS AS NEEDED FOR COUGH OR WHEEZE -   albuterol (VENTOLIN HFA) 108 (90 Base) MCG/ACT inhaler INHALE TWO PUFFS INTO LUNGS EVERY 4 HOURS AS NEEDED FOR WHEEZING OR SHORTNESS OF BREATH   azelastine (ASTELIN) 0.1 % nasal spray INSTILL 2 SPRAYS IN EACH NOSTRIL TWICE DAILY AS NEEDED   Benralizumab (FASENRA) 30 MG/ML SOSY Inject 1 mL (30 mg total) into the skin every 8 (eight) weeks.   diclofenac (VOLTAREN) 75 MG EC tablet Take 75 mg by mouth 2 (two) times daily with a meal.   EPINEPHrine 0.3 mg/0.3 mL IJ SOAJ injection Inject into the muscle.   fluticasone  (FLONASE) 50 MCG/ACT nasal spray INSTILL 2 SPRAYS INTO EACH NOSTRIL TWICE DAILY   hydrOXYzine (ATARAX/VISTARIL) 50 MG tablet TAKE 1/2 TO 1 TABLET BY MOUTH EVERY 8 HOURS AS NEEDED FOR ANXIETY   ibuprofen (ADVIL) 800 MG tablet Take 800 mg by mouth 3 (three) times daily as needed.   ipratropium-albuterol (DUONEB) 0.5-2.5 (3) MG/3ML SOLN Take 3 mLs by nebulization every 4 (four) hours as needed.   levocetirizine (XYZAL) 5 MG tablet TAKE 1 TABLET BY MOUTH EVERY MORNING   lidocaine (XYLOCAINE) 5 % ointment Apply 1 application topically as needed.   montelukast (SINGULAIR) 10 MG tablet TAKE 1 TABLET BY MOUTH AT BEDTIME   morphine (MSIR) 15 MG tablet Take 15 mg by mouth every 8 (eight) hours as needed. Back Pain   pantoprazole (PROTONIX) 40 MG tablet TAKE 1 TABLET BY MOUTH DAILY   SYMBICORT 160-4.5 MCG/ACT inhaler INHALE TWO PUFFS INTO THE LUNGS TWICE DAILY   tiZANidine (ZANAFLEX) 4 MG tablet Take 4 mg by mouth 2 (two) times daily as needed.   [DISCONTINUED] ARNUITY ELLIPTA 100 MCG/ACT AEPB Inhale 1 puff into the lungs daily. For 1-2 weeks during signs and symptoms of upper respiratory infection. (Patient not taking: No sig reported)   [DISCONTINUED] ibuprofen (ADVIL) 600 MG tablet Take 1 tablet (600 mg total) by mouth every 8 (eight) hours as needed.   Facility-Administered Encounter Medications as of 05/11/2021  Medication   Benralizumab SOSY 30 mg   Past medical history  Chronic GERD.  She has been on   PPI for at least 8 years. Bronchial asthma since 2016.  She had eosinophilia.  She has done very well since she has been on benralizumab. Hysterectomy with removal of 1 ovary in 2004. History of scoliosis.  She had lumbar spine fusion in 2010.  She had repeat surgery in 2014 when some of the hardware was removed.  She has constant pain. Bilateral knee osteoarthrosis.  She had right knee arthroscopy in 2017 and left 1 in June 2022.   Allergies  No Known Allergies  Family history  Father died  in auto accident at age 36. Mother is 78 years old and is doing fine.  She has had bilateral hip surgeries. She has a brother age 60 in poor health.  He has external heart pump in place. She has 2 sisters ages 59 and 51.  Older sister is asthma and younger sister is in good health.  Social history  First marriage ended in divorce.  She has 2 sons ages 37 and 35 from first marriage.  They are both in good health.  She has been in long-term relationship but not legally married normal.  She has been on disability secondary to back problems and chronic pain.  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  She has never smoked cigarettes and does not drink alcohol.  She goes to gym 2-3 times a week for exercise and able to walk some.  Physical examination  Blood pressure 134/85, pulse 80, temperature 98.3 F (36.8 C), temperature source Oral, height 5' 7" (1.702 m), weight 185 lb (83.9 kg), last menstrual period 08/30/2002. Patient is alert and in no acute distress. Conjunctiva is pink. Sclera is nonicteric Oropharyngeal mucosa is normal. No neck masses or thyromegaly noted. Cardiac exam with regular rhythm normal S1 and S2. No murmur or gallop noted. Lungs are clear to auscultation. Abdomen is symmetrical soft and nontender without organomegaly or masses. No LE edema or clubbing noted.  Labs/studies Results:   CBC Latest Ref Rng & Units 04/09/2021 07/27/2020 08/28/2019  WBC 3.4 - 10.8 x10E3/uL 5.9 6.6 6.3  Hemoglobin 11.1 - 15.9 g/dL 13.7 14.7 14.9  Hematocrit 34.0 - 46.6 % 40.0 44.7 43.7  Platelets 150 - 450 x10E3/uL 238 274 240    CMP Latest Ref Rng & Units 04/09/2021 08/21/2020 09/12/2019  Glucose 70 - 99 mg/dL 103(H) 93 82  BUN 6 - 24 mg/dL 16 17 15  Creatinine 0.57 - 1.00 mg/dL 0.87 0.79 0.67  Sodium 134 - 144 mmol/L 141 141 142  Potassium 3.5 - 5.2 mmol/L 4.2 4.4 4.0  Chloride 96 - 106 mmol/L 103 100 102  CO2 20 - 29 mmol/L 24 24 27  Calcium 8.7 - 10.2 mg/dL 9.5 9.8 8.7   Total Protein 6.0 - 8.5 g/dL 6.7 6.7 6.0  Total Bilirubin 0.0 - 1.2 mg/dL 0.4 0.3 0.2  Alkaline Phos 44 - 121 IU/L 117 117 105  AST 0 - 40 IU/L 25 21 18  ALT 0 - 32 IU/L 36(H) 24 25    Hepatic Function Latest Ref Rng & Units 04/09/2021 08/21/2020 09/12/2019  Total Protein 6.0 - 8.5 g/dL 6.7 6.7 6.0  Albumin 3.8 - 4.9 g/dL 4.5 4.4 3.8  AST 0 - 40 IU/L 25 21 18  ALT 0 - 32 IU/L 36(H) 24 25  Alk Phosphatase 44 - 121 IU/L 117 117 105  Total Bilirubin 0.0 - 1.2 mg/dL 0.4 0.3 0.2    Recent lab data reviewed.  Assessment:  #  1.  Positive Cologuard test.  She had a positive test in May 2022.  Test and July 2018 was negative.  Family history is negative for colorectal carcinoma.  She does not have any worrisome symptoms.  Will need to rule out colonic adenomas or neoplasm.  We will proceed with diagnostic colonoscopy.  #2.  Esophageal dysphagia.  She has had dysphagia for at least a year primarily to solids.  In the setting of chronic GERD she could have Schatzki's ring or esophageal stricture.  #3.  Chronic GERD.  She is doing well with therapy.  Need to rule out Barrett's esophagus given chronicity of symptoms.  #4.  Borderline ALT of 36.  No history of liver disease.  Prior transaminases have been normal.  We will plan to repeat LFTs at the time of endoscopy before considering further work-up.   Plan:  Diagnostic esophagogastroduodenoscopy with esophageal dilation. Diagnostic colonoscopy in near future. Both procedures will be performed under monitored anesthesia care. Will check LFTs at the time of endoscopic evaluation. Further recommendations to follow.      

## 2021-05-13 ENCOUNTER — Ambulatory Visit (INDEPENDENT_AMBULATORY_CARE_PROVIDER_SITE_OTHER): Payer: Medicare Other | Admitting: Gastroenterology

## 2021-05-14 ENCOUNTER — Other Ambulatory Visit (HOSPITAL_COMMUNITY): Payer: Self-pay | Admitting: Orthopedic Surgery

## 2021-05-14 DIAGNOSIS — M25562 Pain in left knee: Secondary | ICD-10-CM

## 2021-05-17 ENCOUNTER — Encounter (HOSPITAL_COMMUNITY)
Admission: RE | Admit: 2021-05-17 | Discharge: 2021-05-17 | Disposition: A | Discharge status: Home / Self Care | Payer: Medicare Other | Source: Ambulatory Visit | Attending: Internal Medicine | Admitting: Internal Medicine

## 2021-05-17 ENCOUNTER — Other Ambulatory Visit: Payer: Self-pay | Admitting: Orthopedic Surgery

## 2021-05-17 ENCOUNTER — Other Ambulatory Visit (HOSPITAL_COMMUNITY): Payer: Self-pay | Admitting: Orthopedic Surgery

## 2021-05-17 ENCOUNTER — Encounter (HOSPITAL_COMMUNITY): Payer: Self-pay

## 2021-05-17 ENCOUNTER — Other Ambulatory Visit: Payer: Self-pay

## 2021-05-17 DIAGNOSIS — M25462 Effusion, left knee: Secondary | ICD-10-CM

## 2021-05-17 DIAGNOSIS — M25562 Pain in left knee: Secondary | ICD-10-CM

## 2021-05-17 NOTE — Patient Instructions (Signed)
[name]  05/17/2021     @PREFPERIOPPHARMACY @   Your procedure is scheduled on 05/19/2021.  Report to 05/21/2021 at 8:40 A.M.  Call this number if you have problems the morning of surgery:  [phone]   Remember:  Please follow the diet and prep instructions given to you by Dr. [phone] office   Take these medicines the morning of surgery with A SIP OF WATER : Hydroxyzine an Protonix   Please use your  inhalers before coming to the hosptial    Do not wear jewelry, make-up or nail polish.  Do not wear lotions, powders, or perfumes, or deodorant.  Do not shave 48 hours prior to surgery.  Men may shave face and neck.  Do not bring valuables to the hospital.  [facility] is not responsible for any belongings or valuables.  Contacts, dentures or bridgework may not be worn into surgery.  Leave your suitcase in the car.  After surgery it may be brought to your room.  For patients admitted to the hospital, discharge time will be determined by your treatment team.  Patients discharged the day of surgery will not be allowed to drive home.   Name and phone number of your driver:   Fasmily Special instructions:  N/A  Please read over the following fact sheets that you were given. Care and Recovery After Surgery  Upper Endoscopy, Adult Upper endoscopy is a procedure to look inside the upper GI (gastrointestinal) tract. The upper GI tract is made up of: The part of the body that moves food from your mouth to your stomach (esophagus). The stomach. The first part of your small intestine (duodenum). This procedure is also called esophagogastroduodenoscopy (EGD) or gastroscopy. In this procedure, your health care provider passes a thin, flexible tube (endoscope) through your mouth and down your esophagus into your stomach. A small camera is attached to the end of the tube. Images from the camera appear on a monitor in the exam room. During this procedure, your health care provider may  also remove a small piece of tissue to be sent to a lab and examined under a microscope (biopsy). Your health care provider may do an upper endoscopy to diagnose cancers of the upper GI tract. You may also have this procedure to find the cause of other conditions, such as: Stomach pain. Heartburn. Pain or problems when swallowing. Nausea and vomiting. Stomach bleeding. Stomach ulcers. Tell a health care provider about: Any allergies you have. All medicines you are taking, including vitamins, herbs, eye drops, creams, and over-the-counter medicines. Any problems you or family members have had with anesthetic medicines. Any blood disorders you have. Any surgeries you have had. Any medical conditions you have. Whether you are pregnant or may be pregnant. What are the risks? Generally, this is a safe procedure. However, problems may occur, including: Infection. Bleeding. Allergic reactions to medicines. A tear or hole (perforation) in the esophagus, stomach, or duodenum. What happens before the procedure? Staying hydrated Follow instructions from your health care provider about hydration, which may include: Up to 2 hours before the procedure - you may continue to drink clear liquids, such as water, clear fruit juice, black coffee, and plain tea.  Eating and drinking restrictions Follow instructions from your health care provider about eating and drinking, which may include: 8 hours before the procedure - stop eating heavy meals or foods, such as meat, fried foods, or fatty foods. 6 hours before the procedure - stop eating light  meals or foods, such as toast or cereal. 6 hours before the procedure - stop drinking milk or drinks that contain milk. 2 hours before the procedure - stop drinking clear liquids. Medicines Ask your health care provider about: Changing or stopping your regular medicines. This is especially important if you are taking diabetes medicines or blood thinners. Taking  medicines such as aspirin and ibuprofen. These medicines can thin your blood. Do not take these medicines unless your health care provider tells you to take them. Taking over-the-counter medicines, vitamins, herbs, and supplements. General instructions Plan to have someone take you home from the hospital or clinic. If you will be going home right after the procedure, plan to have someone with you for 24 hours. Ask your health care provider what steps will be taken to help prevent infection. What happens during the procedure?  An IV will be inserted into one of your veins. You may be given one or more of the following: A medicine to help you relax (sedative). A medicine to numb the throat (local anesthetic). You will lie on your left side on an exam table. Your health care provider will pass the endoscope through your mouth and down your esophagus. Your health care provider will use the scope to check the inside of your esophagus, stomach, and duodenum. Biopsies may be taken. The endoscope will be removed. The procedure may vary among health care providers and hospitals. What happens after the procedure? Your blood pressure, heart rate, breathing rate, and blood oxygen level will be monitored until you leave the hospital or clinic. Do not drive for 24 hours if you were given a sedative during your procedure. When your throat is no longer numb, you may be given some fluids to drink. It is up to you to get the results of your procedure. Ask your health care provider, or the department that is doing the procedure, when your results will be ready. Summary Upper endoscopy is a procedure to look inside the upper GI tract. During the procedure, an IV will be inserted into one of your veins. You may be given a medicine to help you relax. A medicine will be used to numb your throat. The endoscope will be passed through your mouth and down your esophagus. This information is not intended to replace  advice given to you by your health care provider. Make sure you discuss any questions you have with your health care provider. Document Revised: 12/06/2017 Document Reviewed: 11/13/2017 Elsevier Patient Education  2022 [employer]. Colonoscopy, Adult A colonoscopy is a procedure to look at the entire large intestine. This procedure is done using a long, thin, flexible tube that has a camera on the end. You may have a colonoscopy: As a part of normal colorectal screening. If you have certain symptoms, such as: A low number of red blood cells in your blood (anemia). Diarrhea that does not go away. Pain in your abdomen. Blood in your stool. A colonoscopy can help screen for and diagnose medical problems, including: Tumors. Extra tissue that grows where mucus forms (polyps). Inflammation. Areas of bleeding. Tell your health care provider about: Any allergies you have. All medicines you are taking, including vitamins, herbs, eye drops, creams, and over-the-counter medicines. Any problems you or family members have had with anesthetic medicines. Any blood disorders you have. Any surgeries you have had. Any medical conditions you have. Any problems you have had with having bowel movements. Whether you are pregnant or may be pregnant. What are  the risks? Generally, this is a safe procedure. However, problems may occur, including: Bleeding. Damage to your intestine. Allergic reactions to medicines given during the procedure. Infection. This is rare. What happens before the procedure? Eating and drinking restrictions Follow instructions from your health care provider about eating or drinking restrictions, which may include: A few days before the procedure: Follow a low-fiber diet. Avoid nuts, seeds, dried fruit, raw fruits, and vegetables. 1-3 days before the procedure: Eat only gelatin dessert or ice pops. Drink only clear liquids, such as water, clear juice, clear broth or bouillon,  black coffee or tea, or clear soft drinks or sports drinks. Avoid liquids that contain red or purple dye. The day of the procedure: Do not eat solid foods. You may continue to drink clear liquids until up to 2 hours before the procedure. Do not eat or drink anything starting 2 hours before the procedure, or within the time period that your health care provider recommends. Bowel prep If you were prescribed a bowel prep to take by mouth (orally) to clean out your colon: Take it as told by your health care provider. Starting the day before your procedure, you will need to drink a large amount of liquid medicine. The liquid will cause you to have many bowel movements of loose stool until your stool becomes almost clear or light green. If your skin or the opening between the buttocks (anus) gets irritated from diarrhea, you may relieve the irritation using: Wipes with medicine in them, such as adult wet wipes with aloe and vitamin E. A product to soothe skin, such as petroleum jelly. If you vomit while drinking the bowel prep: Take a break for up to 60 minutes. Begin the bowel prep again. Call your health care provider if you keep vomiting or you cannot take the bowel prep without vomiting. To clean out your colon, you may also be given: Laxative medicines. These help you have a bowel movement. Instructions for enema use. An enema is liquid medicine injected into your rectum. Medicines Ask your health care provider about: Changing or stopping your regular medicines or supplements. This is especially important if you are taking iron supplements, diabetes medicines, or blood thinners. Taking medicines such as aspirin and ibuprofen. These medicines can thin your blood. Do not take these medicines unless your health care provider tells you to take them. Taking over-the-counter medicines, vitamins, herbs, and supplements. General instructions Ask your health care provider what steps will be taken to  help prevent infection. These may include washing skin with a germ-killing soap. Plan to have someone take you home from the hospital or clinic. What happens during the procedure?  An IV will be inserted into one of your veins. You may be given one or more of the following: A medicine to help you relax (sedative). A medicine to numb the area (local anesthetic). A medicine to make you fall asleep (general anesthetic). This is rarely needed. You will lie on your side with your knees bent. The tube will: Have oil or gel put on it (be lubricated). Be inserted into your anus. Be gently eased through all parts of your large intestine. Air will be sent into your colon to keep it open. This may cause some pressure or cramping. Images will be taken with the camera and will appear on a screen. A small tissue sample may be removed to be looked at under a microscope (biopsy). The tissue may be sent to a lab for testing if any  signs of problems are found. If small polyps are found, they may be removed and checked for cancer cells. When the procedure is finished, the tube will be removed. The procedure may vary among health care providers and hospitals. What happens after the procedure? Your blood pressure, heart rate, breathing rate, and blood oxygen level will be monitored until you leave the hospital or clinic. You may have a small amount of blood in your stool. You may pass gas and have mild cramping or bloating in your abdomen. This is caused by the air that was used to open your colon during the exam. Do not drive for 24 hours after the procedure. It is up to you to get the results of your procedure. Ask your health care provider, or the department that is doing the procedure, when your results will be ready. Summary A colonoscopy is a procedure to look at the entire large intestine. Follow instructions from your health care provider about eating and drinking before the procedure. If you were  prescribed an oral bowel prep to clean out your colon, take it as told by your health care provider. During the colonoscopy, a flexible tube with a camera on its end is inserted into the anus and then passed into the other parts of the large intestine. This information is not intended to replace advice given to you by your health care provider. Make sure you discuss any questions you have with your health care provider. Document Revised: 01/04/2019 Document Reviewed: 01/04/2019 Elsevier Patient Education  2022 [employer].

## 2021-05-19 ENCOUNTER — Other Ambulatory Visit: Payer: Self-pay

## 2021-05-19 ENCOUNTER — Encounter (INDEPENDENT_AMBULATORY_CARE_PROVIDER_SITE_OTHER): Payer: Self-pay | Admitting: *Deleted

## 2021-05-19 ENCOUNTER — Ambulatory Visit (HOSPITAL_COMMUNITY): Payer: Medicare Other | Admitting: Anesthesiology

## 2021-05-19 ENCOUNTER — Encounter (HOSPITAL_COMMUNITY)
Admission: RE | Disposition: A | Discharge status: Home / Self Care | Payer: Self-pay | Source: Home / Self Care | Attending: Internal Medicine

## 2021-05-19 ENCOUNTER — Encounter (HOSPITAL_COMMUNITY): Payer: Self-pay | Admitting: Internal Medicine

## 2021-05-19 ENCOUNTER — Ambulatory Visit (HOSPITAL_COMMUNITY)
Admission: RE | Admit: 2021-05-19 | Discharge: 2021-05-19 | Disposition: A | Discharge status: Home / Self Care | Payer: Medicare Other | Attending: Internal Medicine | Admitting: Internal Medicine

## 2021-05-19 DIAGNOSIS — J45909 Unspecified asthma, uncomplicated: Secondary | ICD-10-CM | POA: Insufficient documentation

## 2021-05-19 DIAGNOSIS — K6289 Other specified diseases of anus and rectum: Secondary | ICD-10-CM | POA: Diagnosis not present

## 2021-05-19 DIAGNOSIS — K449 Diaphragmatic hernia without obstruction or gangrene: Secondary | ICD-10-CM | POA: Diagnosis not present

## 2021-05-19 DIAGNOSIS — R195 Other fecal abnormalities: Secondary | ICD-10-CM

## 2021-05-19 DIAGNOSIS — K644 Residual hemorrhoidal skin tags: Secondary | ICD-10-CM | POA: Insufficient documentation

## 2021-05-19 DIAGNOSIS — K219 Gastro-esophageal reflux disease without esophagitis: Secondary | ICD-10-CM | POA: Diagnosis not present

## 2021-05-19 DIAGNOSIS — K319 Disease of stomach and duodenum, unspecified: Secondary | ICD-10-CM | POA: Insufficient documentation

## 2021-05-19 DIAGNOSIS — M1990 Unspecified osteoarthritis, unspecified site: Secondary | ICD-10-CM | POA: Insufficient documentation

## 2021-05-19 DIAGNOSIS — K2970 Gastritis, unspecified, without bleeding: Secondary | ICD-10-CM | POA: Insufficient documentation

## 2021-05-19 DIAGNOSIS — R1314 Dysphagia, pharyngoesophageal phase: Secondary | ICD-10-CM | POA: Diagnosis present

## 2021-05-19 DIAGNOSIS — K5730 Diverticulosis of large intestine without perforation or abscess without bleeding: Secondary | ICD-10-CM | POA: Insufficient documentation

## 2021-05-19 DIAGNOSIS — R1319 Other dysphagia: Secondary | ICD-10-CM

## 2021-05-19 HISTORY — PX: BIOPSY: SHX5522

## 2021-05-19 HISTORY — PX: ESOPHAGEAL DILATION: SHX303

## 2021-05-19 HISTORY — PX: COLONOSCOPY WITH PROPOFOL: SHX5780

## 2021-05-19 HISTORY — PX: ESOPHAGOGASTRODUODENOSCOPY (EGD) WITH PROPOFOL: SHX5813

## 2021-05-19 LAB — HM COLONOSCOPY

## 2021-05-19 SURGERY — COLONOSCOPY WITH PROPOFOL
Anesthesia: General

## 2021-05-19 MED ORDER — PROPOFOL 10 MG/ML IV BOLUS
INTRAVENOUS | Status: DC | PRN
  Administered 2021-05-19 (×2): 40 mg via INTRAVENOUS
  Administered 2021-05-19: 90 mg via INTRAVENOUS

## 2021-05-19 MED ORDER — LACTATED RINGERS IV SOLN: INTRAVENOUS | Status: DC

## 2021-05-19 MED ORDER — PROPOFOL 500 MG/50ML IV EMUL
INTRAVENOUS | Status: DC | PRN
  Administered 2021-05-19: 150 ug/kg/min via INTRAVENOUS

## 2021-05-19 MED ORDER — STERILE WATER FOR IRRIGATION IR SOLN
Status: DC | PRN
  Administered 2021-05-19: 300 mL

## 2021-05-19 MED ORDER — LIDOCAINE HCL (CARDIAC) PF 100 MG/5ML IV SOSY
PREFILLED_SYRINGE | INTRAVENOUS | Status: DC | PRN
  Administered 2021-05-19: 40 mg via INTRAVENOUS

## 2021-05-19 NOTE — Discharge Instructions (Addendum)
No aspirin or NSAIDs for 24 hours Resume other medications as before Resume usual diet. No driving for 24 hours. Physician will call with biopsy results.   

## 2021-05-19 NOTE — Op Note (Signed)
[facility] Patient Name: [name] Procedure Date: 05/19/2021 8:15 AM MRN: [id number] Date of Birth: [birth year] Attending MD: Hildred Laser , MD CSN: [id number] Age: 60 Admit Type: Outpatient Procedure:                Upper GI endoscopy Indications:              Esophageal dysphagia, Gastro-esophageal reflux                            disease Providers:                Hildred Laser, MD, Charlsie Quest. Insurance claims handler, Therapist, sports,                            Suzan Garibaldi. Risa Grill, Technician Referring MD:             Koleen Distance. Gottschalk, DO Medicines:                Propofol per Anesthesia Complications:            No immediate complications. Estimated Blood Loss:     Estimated blood loss was minimal. Procedure:                Pre-Anesthesia Assessment:                           - Prior to the procedure, a History and Physical                            was performed, and patient medications and                            allergies were reviewed. The patient's tolerance of                            previous anesthesia was also reviewed. The risks                            and benefits of the procedure and the sedation                            options and risks were discussed with the patient.                            All questions were answered, and informed consent                            was obtained. Prior Anticoagulants: The patient has                            taken no previous anticoagulant or antiplatelet                            agents except for NSAID medication. ASA Grade  Assessment: III - A patient with severe systemic                            disease. After reviewing the risks and benefits,                            the patient was deemed in satisfactory condition to                            undergo the procedure.                           After obtaining informed consent, the endoscope was                            passed under direct  vision. Throughout the                            procedure, the patient's blood pressure, pulse, and                            oxygen saturations were monitored continuously. The                            [id number]) scope was introduced through the                            mouth, and advanced to the second part of duodenum.                            The upper GI endoscopy was accomplished without                            difficulty. The patient tolerated the procedure                            well. Scope In: 8:23:35 AM Scope Out: 8:31:39 AM Total Procedure Duration: 0 hours 8 minutes 4 seconds  Findings:      The hypopharynx was normal.      The examined esophagus was normal.      The Z-line was regular and was found 34 cm from the incisors.      A 4 cm hiatal hernia was present.      No endoscopic abnormality was evident in the esophagus to explain the       patient's complaint of dysphagia. It was decided, however, to proceed       with dilation of the entire esophagus. The dilation site was examined       following endoscope reinsertion and showed no change and no bleeding,       mucosal tear or perforation.      Patchy mild inflammation characterized by congestion (edema) and       erythema was found in the gastric antrum. Biopsies were taken with a       cold forceps for histology.      The exam of the stomach was  otherwise normal.      The duodenal bulb and second portion of the duodenum were normal. Impression:               - Normal hypopharynx.                           - Normal esophagus.                           - Z-line regular, 34 cm from the incisors.                           - 4 cm hiatal hernia.                           - No endoscopic esophageal abnormality to explain                            patient's dysphagia. Esophagus dilated.                           - Gastritis. Biopsied.                           - Normal duodenal bulb and second  portion of the                            duodenum. Moderate Sedation:      Per Anesthesia Care Recommendation:           - Patient has a contact number available for                            emergencies. The signs and symptoms of potential                            delayed complications were discussed with the                            patient. Return to normal activities tomorrow.                            Written discharge instructions were provided to the                            patient.                           - Resume previous diet today.                           - Continue present medications.                           - No aspirin, ibuprofen, naproxen, or other                            non-steroidal  anti-inflammatory drugs for 1 day.                           - Await pathology results. Procedure Code(s):        --- Professional ---                           [phone], Esophagogastroduodenoscopy, flexible,                            transoral; with biopsy, single or multiple Diagnosis Code(s):        --- Professional ---                           K44.9, Diaphragmatic hernia without obstruction or                            gangrene                           K29.70, Gastritis, unspecified, without bleeding                           R13.14, Dysphagia, pharyngoesophageal phase                           K21.9, Gastro-esophageal reflux disease without                            esophagitis CPT copyright 2019 American Medical Association. All rights reserved. The codes documented in this report are preliminary and upon coder review may  be revised to meet current compliance requirements. Hildred Laser, MD Hildred Laser, MD 05/19/2021 9:02:26 AM This report has been signed electronically. Number of Addenda: 0

## 2021-05-19 NOTE — Transfer of Care (Signed)
Immediate Anesthesia Transfer of Care Note  Patient: [name]  Procedure(s) Performed: COLONOSCOPY WITH PROPOFOL ESOPHAGOGASTRODUODENOSCOPY (EGD) WITH PROPOFOL ESOPHAGEAL DILATION BIOPSY  Patient Location: Short Stay  Anesthesia Type:General  Level of Consciousness: drowsy  Airway & Oxygen Therapy: Patient Spontanous Breathing  Post-op Assessment: Report given to RN and Post -op Vital signs reviewed and stable  Post vital signs: Reviewed and stable  Last Vitals:  Vitals Value Taken Time  BP    Temp    Pulse    Resp    SpO2      Last Pain:  Vitals:   05/19/21 0820  TempSrc:   PainSc: 0-No pain         Complications: No notable events documented.

## 2021-05-19 NOTE — Interval H&P Note (Signed)
History and Physical Interval Note:  05/19/2021 8:17 AM  [name]  has presented today for surgery, with the diagnosis of positive cologuard GERD Dysphagia.  The various methods of treatment have been discussed with the patient and family. After consideration of risks, benefits and other options for treatment, the patient has consented to  Procedure(s) with comments: COLONOSCOPY WITH PROPOFOL (N/A) - 10:10 ESOPHAGOGASTRODUODENOSCOPY (EGD) WITH PROPOFOL (N/A) ESOPHAGEAL DILATION (N/A) as a surgical intervention.  The patient's history has been reviewed, patient examined, no change in status, stable for surgery.  I have reviewed the patient's chart and labs.  Questions were answered to the patient's satisfaction.     [employer]

## 2021-05-19 NOTE — Op Note (Signed)
[facility] Patient Name: [name] Procedure Date: 05/19/2021 8:33 AM MRN: [id number] Date of Birth: 04-Nov-1960 Attending MD: Hildred Laser , MD CSN: [id number] Age: 60 Admit Type: Outpatient Procedure:                Colonoscopy Indications:              Positive Cologuard test Providers:                Hildred Laser, MD, Charlsie Quest. Insurance claims handler, Therapist, sports,                            Suzan Garibaldi. Risa Grill, Technician Referring MD:             Koleen Distance. Gottschalk, DO Medicines:                Propofol per Anesthesia Complications:            No immediate complications. Estimated Blood Loss:     Estimated blood loss: none. Procedure:                Pre-Anesthesia Assessment:                           - Prior to the procedure, a History and Physical                            was performed, and patient medications and                            allergies were reviewed. The patient's tolerance of                            previous anesthesia was also reviewed. The risks                            and benefits of the procedure and the sedation                            options and risks were discussed with the patient.                            All questions were answered, and informed consent                            was obtained. Prior Anticoagulants: The patient has                            taken no previous anticoagulant or antiplatelet                            agents except for NSAID medication. ASA Grade                            Assessment: III - A patient with severe systemic  disease. After reviewing the risks and benefits,                            the patient was deemed in satisfactory condition to                            undergo the procedure.                           After obtaining informed consent, the colonoscope                            was passed under direct vision. Throughout the                            procedure, the  patient's blood pressure, pulse, and                            oxygen saturations were monitored continuously. The                            [id number]) was introduced through the                            anus and advanced to the the terminal ileum, with                            identification of the appendiceal orifice and IC                            valve. The colonoscopy was performed without                            difficulty. The patient tolerated the procedure                            well. The quality of the bowel preparation was                            good. The terminal ileum, ileocecal valve,                            appendiceal orifice, and rectum were photographed. Scope In: 8:35:51 AM Scope Out: 8:53:06 AM Scope Withdrawal Time: 0 hours 9 minutes 23 seconds  Total Procedure Duration: 0 hours 17 minutes 15 seconds  Findings:      The perianal and digital rectal examinations were normal.      The terminal ileum appeared normal.      A single diverticulum was found in the sigmoid colon.      The exam was otherwise normal throughout the examined colon.      External hemorrhoids were found during retroflexion. The hemorrhoids       were small.      Anal papilla(e) were hypertrophied. Impression:               -  The examined portion of the ileum was normal.                           - Diverticulosis in the sigmoid colon.                           - External hemorrhoids.                           - Anal papilla(e) were hypertrophied.                           - No specimens collected. Moderate Sedation:      Per Anesthesia Care Recommendation:           - Patient has a contact number available for                            emergencies. The signs and symptoms of potential                            delayed complications were discussed with the                            patient. Return to normal activities tomorrow.                            Written  discharge instructions were provided to the                            patient.                           - Resume previous diet today.                           - Continue present medications.                           - Repeat colonoscopy in 10 years for screening                            purposes. Procedure Code(s):        --- Professional ---                           [phone], Colonoscopy, flexible; diagnostic, including                            collection of specimen(s) by brushing or washing,                            when performed (separate procedure) Diagnosis Code(s):        --- Professional ---                           K64.4, Residual hemorrhoidal skin tags  K62.89, Other specified diseases of anus and rectum                           R19.5, Other fecal abnormalities                           K57.30, Diverticulosis of large intestine without                            perforation or abscess without bleeding CPT copyright 2019 [employer]. All rights reserved. The codes documented in this report are preliminary and upon coder review may  be revised to meet current compliance requirements. Hildred Laser, MD Hildred Laser, MD 05/19/2021 9:06:35 AM This report has been signed electronically. Number of Addenda: 0

## 2021-05-19 NOTE — Anesthesia Postprocedure Evaluation (Signed)
Anesthesia Post Note  Patient: [name]  Procedure(s) Performed: COLONOSCOPY WITH PROPOFOL ESOPHAGOGASTRODUODENOSCOPY (EGD) WITH PROPOFOL ESOPHAGEAL DILATION BIOPSY  Patient location during evaluation: PACU Anesthesia Type: General Level of consciousness: awake and alert and oriented Pain management: pain level controlled Vital Signs Assessment: post-procedure vital signs reviewed and stable Respiratory status: spontaneous breathing, nonlabored ventilation and respiratory function stable Cardiovascular status: blood pressure returned to baseline and stable Postop Assessment: no apparent nausea or vomiting Anesthetic complications: no   No notable events documented.   Last Vitals:  Vitals:   05/19/21 0719 05/19/21 0858  BP: (!) 147/88 (!) 105/56  Pulse:  80  Resp:  20  Temp:  37.5 C  SpO2:  98%    Last Pain:  Vitals:   05/19/21 0915  TempSrc:   PainSc: 0-No pain                 Charman Blasco C Eupha Lobb

## 2021-05-19 NOTE — Anesthesia Preprocedure Evaluation (Signed)
Anesthesia Evaluation  Patient identified by MRN, date of birth, ID band Patient awake    Reviewed: Allergy & Precautions, H&P , NPO status , Patient's Chart, lab work & pertinent test results  Airway Mallampati: II  TM Distance: >3 FB Neck ROM: Full    Dental  (+) Dental Advisory Given, Missing   Pulmonary asthma , neg COPD,  COPD inhaler,    Pulmonary exam normal breath sounds clear to auscultation       Cardiovascular negative cardio ROS Normal cardiovascular exam Rhythm:Regular Rate:Normal     Neuro/Psych  Headaches, negative psych ROS   GI/Hepatic Neg liver ROS, GERD  Medicated and Controlled,  Endo/Other  negative endocrine ROS  Renal/GU negative Renal ROS  negative genitourinary   Musculoskeletal  (+) Arthritis , Osteoarthritis,    Abdominal   Peds negative pediatric ROS (+)  Hematology negative hematology ROS (+)   Anesthesia Other Findings   Reproductive/Obstetrics negative OB ROS                             Anesthesia Physical Anesthesia Plan  ASA: 2  Anesthesia Plan: General   Post-op Pain Management:    Induction:   PONV Risk Score and Plan: TIVA  Airway Management Planned: Nasal Cannula and Natural Airway  Additional Equipment:   Intra-op Plan:   Post-operative Plan:   Informed Consent: I have reviewed the patients History and Physical, chart, labs and discussed the procedure including the risks, benefits and alternatives for the proposed anesthesia with the patient or authorized representative who has indicated his/her understanding and acceptance.     Dental advisory given  Plan Discussed with: CRNA and Surgeon  Anesthesia Plan Comments:         Anesthesia Quick Evaluation

## 2021-05-21 LAB — SURGICAL PATHOLOGY

## 2021-05-23 ENCOUNTER — Encounter (HOSPITAL_COMMUNITY): Payer: Self-pay | Admitting: Emergency Medicine

## 2021-05-23 ENCOUNTER — Emergency Department (HOSPITAL_COMMUNITY): Payer: Medicare Other

## 2021-05-23 ENCOUNTER — Emergency Department (HOSPITAL_COMMUNITY)
Admission: EM | Admit: 2021-05-23 | Discharge: 2021-05-23 | Disposition: A | Discharge status: Home / Self Care | Payer: Medicare Other | Attending: Emergency Medicine | Admitting: Emergency Medicine

## 2021-05-23 DIAGNOSIS — Z20822 Contact with and (suspected) exposure to covid-19: Secondary | ICD-10-CM | POA: Diagnosis not present

## 2021-05-23 DIAGNOSIS — J4540 Moderate persistent asthma, uncomplicated: Secondary | ICD-10-CM | POA: Insufficient documentation

## 2021-05-23 DIAGNOSIS — J40 Bronchitis, not specified as acute or chronic: Secondary | ICD-10-CM | POA: Diagnosis not present

## 2021-05-23 DIAGNOSIS — J101 Influenza due to other identified influenza virus with other respiratory manifestations: Secondary | ICD-10-CM | POA: Insufficient documentation

## 2021-05-23 DIAGNOSIS — Z7951 Long term (current) use of inhaled steroids: Secondary | ICD-10-CM | POA: Insufficient documentation

## 2021-05-23 DIAGNOSIS — R059 Cough, unspecified: Secondary | ICD-10-CM | POA: Diagnosis present

## 2021-05-23 LAB — RESP PANEL BY RT-PCR (FLU A&B, COVID) ARPGX2
Influenza A by PCR: POSITIVE — AB
Influenza B by PCR: NEGATIVE
SARS Coronavirus 2 by RT PCR: NEGATIVE

## 2021-05-23 MED ORDER — PREDNISONE 50 MG PO TABS
60.0000 mg | ORAL_TABLET | Freq: Once | ORAL | Status: AC
  Administered 2021-05-23: 08:00:00 60 mg via ORAL
  Filled 2021-05-23: qty 1

## 2021-05-23 MED ORDER — ALBUTEROL SULFATE HFA 108 (90 BASE) MCG/ACT IN AERS: 2.0000 | INHALATION_SPRAY | RESPIRATORY_TRACT | 2 refills | Status: DC | PRN

## 2021-05-23 MED ORDER — IPRATROPIUM-ALBUTEROL 0.5-2.5 (3) MG/3ML IN SOLN
3.0000 mL | Freq: Once | RESPIRATORY_TRACT | Status: AC
  Administered 2021-05-23: 08:00:00 3 mL via RESPIRATORY_TRACT
  Filled 2021-05-23: qty 3

## 2021-05-23 MED ORDER — PREDNISONE 20 MG PO TABS: 40.0000 mg | ORAL_TABLET | Freq: Every day | ORAL | 0 refills | Status: DC

## 2021-05-23 NOTE — ED Triage Notes (Signed)
Pt c/o cough, fever, body aches, and just not feeling good for the past week. Pt has been taking tylenol at home for fever, last dose about 1 hr PTA.

## 2021-05-23 NOTE — ED Provider Notes (Addendum)
[facility] EMERGENCY DEPARTMENT Provider Note   CSN: [id number] Arrival date & time: 05/23/21  0451     History Chief Complaint  Patient presents with   Fever    [name] is a 60 y.o. female.  Patient presents with flulike illness that has been present for 1 week.  She reports that she has been experiencing fever, cough and generalized body aches.  She feels weak and has been experiencing shortness of breath.  Patient does have a history of asthma but has not required treatment for some years.  Patient reports cough is productive of green sputum.   Fever Associated symptoms: myalgias       Past Medical History:  Diagnosis Date   Arthritis    Spine, R knee   Asthma    Back pain    Cough 09/03/2014   Followed in Pulmonary clinic/ [facility]/ [place] - sinus CT 09/08/2014> Mild mucosal thickening involves the paranasal sinuses  - Eos 1.2  08/06/14 - Allergy profile 11/27/14 >  Eos 1.0/ IgE  41 with neg RAST     GERD (gastroesophageal reflux disease)    History of blood transfusion    Back surgery    Patient Active Problem List   Diagnosis Date Noted   GERD (gastroesophageal reflux disease)    Positive colorectal cancer screening using Cologuard test    Esophageal dysphagia    Varicose veins of bilateral lower extremities with pain 01/02/2020   Seasonal and perennial allergic rhinitis 04/15/2018   Fusion of spine, thoracolumbar region 11/29/2016   Chronic insomnia 11/29/2016   Eosinophilia 03/29/2016   Moderate persistent asthma, uncomplicated 03/29/2016   Mixed rhinitis 03/29/2016   Other social stressor 03/29/2016   Chronic migraine w/o aura w/o status migrainosus, not intractable 03/14/2016   Arthritis of knee, degenerative 07/09/2015   Chronic low back pain 04/15/2015   Cough variant asthma 08/09/2014   Pulmonary infiltrates with eosinophilia (HCC) 08/09/2014    Past Surgical History:  Procedure Laterality Date   ABDOMINAL HYSTERECTOMY     "cysts",  pelvic pain   BACK SURGERY     KNEE ARTHROSCOPY Right 2017   SPINE SURGERY  2010   fusion   SPINE SURGERY  2014   remove hardware     OB History   No obstetric history on file.     Family History  Problem Relation Age of Onset   Emphysema Maternal Grandmother        never smoker   COPD Maternal Grandmother    Emphysema Maternal Uncle        smoked   COPD Maternal Uncle    Arthritis Mother    Diabetes Mother    Hyperlipidemia Mother    Hypertension Mother    Early death Father 74       MVA   Asthma Sister    Asthma Brother    Heart disease Maternal Grandfather     Social History   Tobacco Use   Smoking status: Never   Smokeless tobacco: Never  Vaping Use   Vaping Use: Never used  Substance Use Topics   Alcohol use: No   Drug use: No    Home Medications Prior to Admission medications   Medication Sig Start Date End Date Taking? Authorizing Provider  albuterol (VENTOLIN HFA) 108 (90 Base) MCG/ACT inhaler Inhale 2 puffs into the lungs every 4 (four) hours as needed for wheezing or shortness of breath. 05/23/21  Yes Milta Croson, Canary Brim, MD  predniSONE (DELTASONE) 20  MG tablet Take 2 tablets (40 mg total) by mouth daily with breakfast. 05/24/21  Yes Jamarri Vuncannon, Canary Brim, MD  azelastine (ASTELIN) 0.1 % nasal spray INSTILL 2 SPRAYS IN EACH NOSTRIL TWICE DAILY AS NEEDED 04/16/21   Alfonse Spruce, MD  Benralizumab [facility]) 30 MG/ML SOSY Inject 1 mL (30 mg total) into the skin every 8 (eight) weeks. 04/05/21   Alfonse Spruce, MD  diclofenac (VOLTAREN) 75 MG EC tablet Take 1 tablet (75 mg total) by mouth 2 (two) times daily with a meal. 05/20/21   Rehman, Joline Maxcy, MD  EPINEPHrine 0.3 mg/0.3 mL IJ SOAJ injection Inject into the muscle. 09/03/20   [provider]  fluticasone (FLONASE) 50 MCG/ACT nasal spray INSTILL 2 SPRAYS INTO EACH NOSTRIL TWICE DAILY 04/14/21   Alfonse Spruce, MD  hydrOXYzine (ATARAX/VISTARIL) 50 MG tablet TAKE 1/2 TO 1  TABLET BY MOUTH EVERY 8 HOURS AS NEEDED FOR ANXIETY 12/08/20   Gottschalk, Ashly M, DO  ipratropium-albuterol (DUONEB) 0.5-2.5 (3) MG/3ML SOLN Take 3 mLs by nebulization every 4 (four) hours as needed. 08/28/19 05/11/21  Alfonse Spruce, MD  levocetirizine (XYZAL) 5 MG tablet TAKE 1 TABLET BY MOUTH EVERY MORNING 11/19/20   Alfonse Spruce, MD  lidocaine (XYLOCAINE) 5 % ointment Apply 1 application topically as needed. 12/18/20   Delynn Flavin M, DO  montelukast (SINGULAIR) 10 MG tablet TAKE 1 TABLET BY MOUTH AT BEDTIME 04/14/21   Alfonse Spruce, MD  morphine (MSIR) 15 MG tablet Take 15 mg by mouth every 8 (eight) hours as needed. Back Pain    [provider]  pantoprazole (PROTONIX) 40 MG tablet TAKE 1 TABLET BY MOUTH DAILY 04/16/21   Alfonse Spruce, MD  SYMBICORT 160-4.5 MCG/ACT inhaler INHALE TWO PUFFS INTO THE LUNGS TWICE DAILY 04/14/21   Alfonse Spruce, MD  tiZANidine (ZANAFLEX) 4 MG tablet Take 4 mg by mouth 2 (two) times daily as needed.    [provider]    Allergies    Patient has no known allergies.  Review of Systems   Review of Systems  Constitutional:  Positive for fever.  Respiratory:  Positive for shortness of breath and wheezing.   Musculoskeletal:  Positive for myalgias.  All other systems reviewed and are negative.  Physical Exam Updated Vital Signs BP (!) 141/82   Pulse 70   Temp 98.5 F (36.9 C) (Oral)   Resp 18   Ht 5\' 7"  (1.702 m)   Wt 77.1 kg   LMP 08/30/2002 (Approximate)   SpO2 96%   BMI 26.63 kg/m   Physical Exam Vitals and nursing note reviewed.  Constitutional:      General: She is not in acute distress.    Appearance: Normal appearance. She is well-developed.  HENT:     Head: Normocephalic and atraumatic.     Right Ear: Hearing normal.     Left Ear: Hearing normal.     Nose: Nose normal.  Eyes:     Conjunctiva/sclera: Conjunctivae normal.     Pupils: Pupils are equal, round, and reactive to  light.  Cardiovascular:     Rate and Rhythm: Regular rhythm.     Heart sounds: S1 normal and S2 normal. No murmur heard.   No friction rub. No gallop.  Pulmonary:     Effort: Pulmonary effort is normal. No respiratory distress.     Breath sounds: Decreased air movement present. Wheezing present.  Chest:     Chest wall: No tenderness.  Abdominal:  General: Bowel sounds are normal.     Palpations: Abdomen is soft.     Tenderness: There is no abdominal tenderness. There is no guarding or rebound. Negative signs include Murphy's sign and McBurney's sign.     Hernia: No hernia is present.  Musculoskeletal:        General: Normal range of motion.     Cervical back: Normal range of motion and neck supple.  Skin:    General: Skin is warm and dry.     Findings: No rash.  Neurological:     Mental Status: She is alert and oriented to person, place, and time.     GCS: GCS eye subscore is 4. GCS verbal subscore is 5. GCS motor subscore is 6.     Cranial Nerves: No cranial nerve deficit.     Sensory: No sensory deficit.     Coordination: Coordination normal.  Psychiatric:        Speech: Speech normal.        Behavior: Behavior normal.        Thought Content: Thought content normal.    ED Results / Procedures / Treatments   Labs (all labs ordered are listed, but only abnormal results are displayed) Labs Reviewed  RESP PANEL BY RT-PCR (FLU A&B, COVID) ARPGX2 - Abnormal; Notable for the following components:      Result Value   Influenza A by PCR POSITIVE (*)    All other components within normal limits    EKG None  Radiology DG Chest Port 1 View  Result Date: 05/23/2021 CLINICAL DATA:  60 year old female with history of fever and cough. Body aches for 1 week. EXAM: PORTABLE CHEST 1 VIEW COMPARISON:  Chest x-ray 07/27/2020. FINDINGS: Lung volumes are normal. No consolidative airspace disease. No pleural effusions. No pneumothorax. No pulmonary nodule or mass noted. Pulmonary  vasculature and the cardiomediastinal silhouette are within normal limits. Rod and screw fixation hardware in the lower thoracic and lumbar spine, incompletely imaged. IMPRESSION: No radiographic evidence of acute cardiopulmonary disease. Electronically Signed   By: Trudie Reed M.D.   On: 05/23/2021 06:13    Procedures Procedures   Medications Ordered in ED Medications  predniSONE (DELTASONE) tablet 60 mg (has no administration in time range)  ipratropium-albuterol (DUONEB) 0.5-2.5 (3) MG/3ML nebulizer solution 3 mL (has no administration in time range)    ED Course  I have reviewed the triage vital signs and the nursing notes.  Pertinent labs & imaging results that were available during my care of the patient were reviewed by me and considered in my medical decision making (see chart for details).    MDM Rules/Calculators/A&P                           Patient presents to the emergency department for evaluation of cough, chest congestion, shortness of breath.  Patient does have a history of asthma but only has infrequent wheezing.  She does not currently have an inhaler to use.  She did have wheezing at arrival.  Chest x-ray was clear, no pneumonia.  She has not hypoxic.  We will treat for acute bronchitis, caused by influenza A.  Final Clinical Impression(s) / ED Diagnoses Final diagnoses:  Bronchitis  Influenza A    Rx / DC Orders ED Discharge Orders          Ordered    predniSONE (DELTASONE) 20 MG tablet  Daily with breakfast  05/23/21 0715    albuterol (VENTOLIN HFA) 108 (90 Base) MCG/ACT inhaler  Every 4 hours PRN        05/23/21 0715             Gilda Crease, MD 05/23/21 [id number]    Gilda Crease, MD 05/23/21 [phone]

## 2021-05-24 ENCOUNTER — Encounter (HOSPITAL_COMMUNITY): Payer: Self-pay | Admitting: Internal Medicine

## 2021-05-27 ENCOUNTER — Ambulatory Visit (INDEPENDENT_AMBULATORY_CARE_PROVIDER_SITE_OTHER): Payer: Medicare Other | Admitting: Family

## 2021-05-27 ENCOUNTER — Encounter: Payer: Self-pay | Admitting: Family

## 2021-05-27 DIAGNOSIS — J208 Acute bronchitis due to other specified organisms: Secondary | ICD-10-CM

## 2021-05-27 DIAGNOSIS — Z09 Encounter for follow-up examination after completed treatment for conditions other than malignant neoplasm: Secondary | ICD-10-CM

## 2021-05-27 DIAGNOSIS — Z8709 Personal history of other diseases of the respiratory system: Secondary | ICD-10-CM

## 2021-05-27 DIAGNOSIS — B9689 Other specified bacterial agents as the cause of diseases classified elsewhere: Secondary | ICD-10-CM

## 2021-05-27 MED ORDER — BENZONATATE 200 MG PO CAPS
200.0000 mg | ORAL_CAPSULE | Freq: Three times a day (TID) | ORAL | 1 refills | Status: DC | PRN
Start: 2021-05-27 — End: 2021-08-23

## 2021-05-27 MED ORDER — DOXYCYCLINE HYCLATE 100 MG PO TABS: 100.0000 mg | ORAL_TABLET | Freq: Two times a day (BID) | ORAL | 0 refills | Status: DC

## 2021-05-27 NOTE — Patient Instructions (Signed)

## 2021-05-27 NOTE — Progress Notes (Signed)
Virtual Visit  Note Due to COVID-19 pandemic this visit was conducted virtually. This visit type was conducted due to national recommendations for restrictions regarding the COVID-19 Pandemic (e.g. social distancing, sheltering in place) in an effort to limit this patient's exposure and mitigate transmission in our community. All issues noted in this document were discussed and addressed.  A physical exam was not performed with this format.  I connected with [name] on 05/27/21 at 10:05 AM  by telephone and verified that I am speaking with the correct person using two identifiers. [name] is currently located at store and no one is currently with her during visit. The provider, Jannifer Rodney, FNP is located in their office at time of visit.  I discussed the limitations, risks, security and privacy concerns of performing an evaluation and management service by telephone and the availability of in person appointments. I also discussed with the patient that there may be a patient responsible charge related to this service. The patient expressed understanding and agreed to proceed.  [name], [name] are scheduled for a virtual visit with your provider today.    Just as we do with appointments in the office, we must obtain your consent to participate.  Your consent will be active for this visit and any virtual visit you may have with one of our providers in the next 365 days.    If you have a MyChart account, I can also send a copy of this consent to you electronically.  All virtual visits are billed to your insurance company just like a traditional visit in the office.  As this is a virtual visit, video technology does not allow for your provider to perform a traditional examination.  This may limit your provider's ability to fully assess your condition.  If your provider identifies any concerns that need to be evaluated in person or the need to arrange testing such as labs, EKG, etc, we will make  arrangements to do so.    Although advances in technology are sophisticated, we cannot ensure that it will always work on either your end or our end.  If the connection with a video visit is poor, we may have to switch to a telephone visit.  With either a video or telephone visit, we are not always able to ensure that we have a secure connection.   I need to obtain your verbal consent now.   Are you willing to proceed with your visit today?   [name] has provided verbal consent on 05/27/2021 for a virtual visit (video or telephone).   Jannifer Rodney, [place] 05/27/2021  10:07 AM    History and Present Illness:  Pt calls the office today with continued flu symptoms. She went to the ED on 05/23/21 and was positive for Flu A. She had a negative chest x-ray and was given prednisone 40 mg for 5 days.  Cough This is a new problem. The current episode started 1 to 4 weeks ago. The problem has been gradually improving. The problem occurs every few minutes. The cough is Productive of sputum. Associated symptoms include nasal congestion, postnasal drip, shortness of breath and wheezing. Pertinent negatives include no chills, ear congestion, ear pain, fever or headaches. The symptoms are aggravated by lying down. She has tried rest (prednisone) for the symptoms. The treatment provided mild relief.     Review of Systems  Constitutional:  Negative for chills and fever.  HENT:  Positive for postnasal drip. Negative  for ear pain.   Respiratory:  Positive for cough, shortness of breath and wheezing.   Neurological:  Negative for headaches.    Observations/Objective: No SOB or distress noted, deep coarse cough  Assessment and Plan: 1. Acute bacterial bronchitis - Take meds as prescribed - Use a cool mist humidifier  -Use saline nose sprays frequently -Force fluids -For any cough or congestion  Use plain Mucinex- regular strength or max strength is fine -For fever or aces or pains- take tylenol or  ibuprofen. -Throat lozenges if help -RTO if symptoms worsen or do not improve  - doxycycline (VIBRA-TABS) 100 MG tablet; Take 1 tablet (100 mg total) by mouth 2 (two) times daily.  Dispense: 20 tablet; Refill: 0 - benzonatate (TESSALON) 200 MG capsule; Take 1 capsule (200 mg total) by mouth 3 (three) times daily as needed.  Dispense: 30 capsule; Refill: 1  2. H/O influenza  3. Hospital discharge follow-up     I discussed the assessment and treatment plan with the patient. The patient was provided an opportunity to ask questions and all were answered. The patient agreed with the plan and demonstrated an understanding of the instructions.   The patient was advised to call back or seek an in-person evaluation if the symptoms worsen or if the condition fails to improve as anticipated.  The above assessment and management plan was discussed with the patient. The patient verbalized understanding of and has agreed to the management plan. Patient is aware to call the clinic if symptoms persist or worsen. Patient is aware when to return to the clinic for a follow-up visit. Patient educated on when it is appropriate to go to the emergency department.   Time call ended:  10:16 AM   I provided 11 minutes of  non face-to-face time during this encounter.    Jannifer Rodney, FNP

## 2021-06-01 ENCOUNTER — Other Ambulatory Visit: Payer: Self-pay

## 2021-06-01 ENCOUNTER — Ambulatory Visit (HOSPITAL_COMMUNITY)
Admission: RE | Admit: 2021-06-01 | Discharge: 2021-06-01 | Disposition: A | Discharge status: Home / Self Care | Payer: Medicare Other | Source: Ambulatory Visit | Attending: Vascular Surgery | Admitting: Vascular Surgery

## 2021-06-01 ENCOUNTER — Ambulatory Visit (INDEPENDENT_AMBULATORY_CARE_PROVIDER_SITE_OTHER): Payer: Medicare Other | Admitting: Vascular Surgery

## 2021-06-01 ENCOUNTER — Encounter: Payer: Self-pay | Admitting: Vascular Surgery

## 2021-06-01 VITALS — BP 119/52 | HR 54 | Temp 97.7°F | Resp 16 | Ht 67.0 in | Wt 176.0 lb

## 2021-06-01 DIAGNOSIS — I83813 Varicose veins of bilateral lower extremities with pain: Secondary | ICD-10-CM | POA: Insufficient documentation

## 2021-06-01 DIAGNOSIS — M25562 Pain in left knee: Secondary | ICD-10-CM | POA: Diagnosis present

## 2021-06-01 NOTE — Progress Notes (Signed)
Patient name: [name] MRN: [id number] DOB: 1961-01-19 Sex: female  REASON FOR CONSULT: Follow-up varicose veins  HPI: [name] is a 60 y.o. female, with history of osteoarthritis that presents for follow-up of her varicose veins.  She specifically has noticed ongoing swelling and more discomfort with her symptomatic varicose veins in the left leg.  She was previously seen by our PA last year on 02/21/2020 and given conservative measures.  She has been wearing thigh-high compression stockings 20 to 30 mmHg.  She has a history of a left small saphenous superficial thrombus.  No other recent trauma.  Compression stockings do help a lot with her symptoms.  Past Medical History:  Diagnosis Date   Arthritis    Spine, R knee   Asthma    Back pain    Cough 09/03/2014   Followed in Pulmonary clinic/ [facility]/ [place] - sinus CT 09/08/2014> Mild mucosal thickening involves the paranasal sinuses  - Eos 1.2  08/06/14 - Allergy profile 11/27/14 >  Eos 1.0/ IgE  41 with neg RAST     GERD (gastroesophageal reflux disease)    History of blood transfusion    Back surgery    Past Surgical History:  Procedure Laterality Date   ABDOMINAL HYSTERECTOMY     "cysts", pelvic pain   BACK SURGERY     BIOPSY  05/19/2021   Procedure: BIOPSY;  Surgeon: Malissa Hippo, MD;  [facility];  Service: Endoscopy;;   COLONOSCOPY WITH PROPOFOL N/A 05/19/2021   Procedure: COLONOSCOPY WITH PROPOFOL;  Surgeon: Malissa Hippo, MD;  [facility];  Service: Endoscopy;  Laterality: N/A;  10:10   ESOPHAGEAL DILATION N/A 05/19/2021   Procedure: ESOPHAGEAL DILATION;  Surgeon: Malissa Hippo, MD;  [facility];  Service: Endoscopy;  Laterality: N/A;   ESOPHAGOGASTRODUODENOSCOPY (EGD) WITH PROPOFOL N/A 05/19/2021   Procedure: ESOPHAGOGASTRODUODENOSCOPY (EGD) WITH PROPOFOL;  Surgeon: Malissa Hippo, MD;  [facility];  Service: Endoscopy;  Laterality: N/A;   KNEE  ARTHROSCOPY Right 2017   SPINE SURGERY  2010   fusion   SPINE SURGERY  2014   remove hardware    Family History  Problem Relation Age of Onset   Emphysema Maternal Grandmother        never smoker   COPD Maternal Grandmother    Emphysema Maternal Uncle        smoked   COPD Maternal Uncle    Arthritis Mother    Diabetes Mother    Hyperlipidemia Mother    Hypertension Mother    Early death Father 90       MVA   Asthma Sister    Asthma Brother    Heart disease Maternal Grandfather     SOCIAL HISTORY: Social History   Socioeconomic History   Marital status: Significant Other    Spouse name: Clide Cliff   Number of children: 2   Years of education: 12   Highest education level: Not on file  Occupational History   Occupation: disabled    Comment: back  Tobacco Use   Smoking status: Never   Smokeless tobacco: Never  Vaping Use   Vaping Use: Never used  Substance and Sexual Activity   Alcohol use: No   Drug use: No   Sexual activity: Yes    Birth control/protection: Surgical  Other Topics Concern   Not on file  Social History Narrative   Lives with [place] - 27 years   [place]  has end stage colon cancer   Two sons/ grandchildren   Disabled from back pain/had scoliosis. Has had back reconstruction.    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    Social Determinants of Health   Financial Resource Strain: Low Risk    Difficulty of Paying Living Expenses: Not hard at all  Food Insecurity: No Food Insecurity   Worried About Programme researcher, broadcasting/film/video in the Last Year: Never true   Ran Out of Food in the Last Year: Never true  Transportation Needs: No Transportation Needs   Lack of Transportation (Medical): No   Lack of Transportation (Non-Medical): No  Physical Activity: Sufficiently Active   Days of Exercise per Week: 3 days   Minutes of Exercise per Session: 60 min  Stress: No Stress Concern Present   Feeling of Stress : Only a little  Social Connections: Moderately Integrated   Frequency of  Communication with Friends and Family: More than three times a week   Frequency of Social Gatherings with Friends and Family: Once a week   Attends Religious Services: More than 4 times per year   Active Member of [employer] or Organizations: No   Attends Engineer, structural: Never   Marital Status: Living with partner  Intimate Partner Violence: Not At Risk   Fear of Current or Ex-Partner: No   Emotionally Abused: No   Physically Abused: No   Sexually Abused: No    No Known Allergies  Current Outpatient Medications  Medication Sig Dispense Refill   albuterol (VENTOLIN HFA) 108 (90 Base) MCG/ACT inhaler Inhale 2 puffs into the lungs every 4 (four) hours as needed for wheezing or shortness of breath. 1 each 2   azelastine (ASTELIN) 0.1 % nasal spray INSTILL 2 SPRAYS IN EACH NOSTRIL TWICE DAILY AS NEEDED 30 mL 5   Benralizumab (FASENRA) 30 MG/ML SOSY Inject 1 mL (30 mg total) into the skin every 8 (eight) weeks. 1 mL 6   benzonatate (TESSALON) 200 MG capsule Take 1 capsule (200 mg total) by mouth 3 (three) times daily as needed. 30 capsule 1   diclofenac (VOLTAREN) 75 MG EC tablet Take 1 tablet (75 mg total) by mouth 2 (two) times daily with a meal.  2   doxycycline (VIBRA-TABS) 100 MG tablet Take 1 tablet (100 mg total) by mouth 2 (two) times daily. 20 tablet 0   EPINEPHrine 0.3 mg/0.3 mL IJ SOAJ injection Inject into the muscle.     fluticasone (FLONASE) 50 MCG/ACT nasal spray INSTILL 2 SPRAYS INTO EACH NOSTRIL TWICE DAILY 16 g 3   hydrOXYzine (ATARAX/VISTARIL) 50 MG tablet TAKE 1/2 TO 1 TABLET BY MOUTH EVERY 8 HOURS AS NEEDED FOR ANXIETY 30 tablet prn   levocetirizine (XYZAL) 5 MG tablet TAKE 1 TABLET BY MOUTH EVERY MORNING 90 tablet 2   lidocaine (XYLOCAINE) 5 % ointment Apply 1 application topically as needed. 35.44 g 0   montelukast (SINGULAIR) 10 MG tablet TAKE 1 TABLET BY MOUTH AT BEDTIME 30 tablet 3   morphine (MSIR) 15 MG tablet Take 15 mg by mouth every 8 (eight) hours as  needed. Back Pain     pantoprazole (PROTONIX) 40 MG tablet TAKE 1 TABLET BY MOUTH DAILY 30 tablet 5   SYMBICORT 160-4.5 MCG/ACT inhaler INHALE TWO PUFFS INTO THE LUNGS TWICE DAILY 10.2 g 3   tiZANidine (ZANAFLEX) 4 MG tablet Take 4 mg by mouth 2 (two) times daily as needed.     ipratropium-albuterol (DUONEB) 0.5-2.5 (3) MG/3ML SOLN Take 3 mLs by nebulization  every 4 (four) hours as needed. 360 mL 0   Current Facility-Administered Medications  Medication Dose Route Frequency Provider Last Rate Last Admin   Benralizumab SOSY 30 mg  30 mg Subcutaneous Q8 Toney Reil, MD   30 mg at 04/14/21 0849    REVIEW OF SYSTEMS:  [X]  denotes positive finding, [ ]  denotes negative finding Cardiac  Comments:  Chest pain or chest pressure:    Shortness of breath upon exertion:    Short of breath when lying flat:    Irregular heart rhythm:        Vascular    Pain in calf, thigh, or hip brought on by ambulation:    Pain in feet at night that wakes you up from your sleep:     Blood clot in your veins:    Leg swelling:  x       Pulmonary    Oxygen at home:    Productive cough:     Wheezing:         Neurologic    Sudden weakness in arms or legs:     Sudden numbness in arms or legs:     Sudden onset of difficulty speaking or slurred speech:    Temporary loss of vision in one eye:     Problems with dizziness:         Gastrointestinal    Blood in stool:     Vomited blood:         Genitourinary    Burning when urinating:     Blood in urine:        Psychiatric    Major depression:         Hematologic    Bleeding problems:    Problems with blood clotting too easily:        Skin    Rashes or ulcers:        Constitutional    Fever or chills:      PHYSICAL EXAM: Vitals:   06/01/21 1133  BP: (!) 119/52  Pulse: (!) 54  Resp: 16  Temp: 97.7 F (36.5 C)  TempSrc: Temporal  SpO2: 96%  Weight: 176 lb (79.8 kg)  Height: 5\' 7"  (1.702 m)    GENERAL: The patient is a  well-nourished female, in no acute distress. The vital signs are documented above. CARDIAC: There is a regular rate and rhythm.  VASCULAR:  Palpable femoral pulses bilaterally Palpable DP pulses bilaterally Prominent left leg varicosities particularly below the knee PULMONARY: No respiratory distress. ABDOMEN: Soft and non-tender. MUSCULOSKELETAL: There are no major deformities or cyanosis. NEUROLOGIC: No focal weakness or paresthesias are detected. SKIN: There are no ulcers or rashes noted. PSYCHIATRIC: The patient has a normal affect.     DATA:   Indications: Swelling, and varicosities.     Performing Technologist: Ralene Cork RVT      Examination Guidelines: A complete evaluation includes B-mode imaging,  spectral  Doppler, color Doppler, and power Doppler as needed of all accessible  portions  of each vessel. Bilateral testing is considered an integral part of a  complete  examination. Limited examinations for reoccurring indications may be  performed  as noted. The reflux portion of the exam is performed with the patient in  reverse Trendelenburg.  Significant venous reflux is defined as >500 ms in the superficial venous  system, and >1 second in the deep venous system.      +--------------+---------+------+-----------+------------+--------+  LEFT  Reflux NoRefluxReflux TimeDiameter cmsComments                          Yes                                   +--------------+---------+------+-----------+------------+--------+  CFV                     yes   >1 second                       +--------------+---------+------+-----------+------------+--------+  FV mid        no                                              +--------------+---------+------+-----------+------------+--------+  Popliteal     no                                              +--------------+---------+------+-----------+------------+--------+  GSV at  SFJ              yes    >500 ms      0.82              +--------------+---------+------+-----------+------------+--------+  GSV prox thighno                            0.31              +--------------+---------+------+-----------+------------+--------+  GSV mid thigh no                            0.3               +--------------+---------+------+-----------+------------+--------+  GSV dist thighno                            0.36              +--------------+---------+------+-----------+------------+--------+  GSV at knee             yes    >500 ms      0.29              +--------------+---------+------+-----------+------------+--------+  GSV prox calf           yes    >500 ms      0.44              +--------------+---------+------+-----------+------------+--------+  SSV Pop Fossa no                            0.2               +--------------+---------+------+-----------+------------+--------+  SSV prox calf no                            0.24              +--------------+---------+------+-----------+------------+--------+  SSV mid calf  no  0.18              +--------------+---------+------+-----------+------------+--------+        Summary:  Left:  - No evidence of deep vein thrombosis seen in the left lower extremity,  from the common femoral through the popliteal veins.  - No evidence of superficial venous thrombosis in the left lower  extremity.  - Deep vein reflux in the CFV.  - Superficial vein reflux in the SFJ and GSV at the knee and proximal  calf.      Assessment/Plan:  60 year old female presents for another evaluation of her left lower extremity varicose veins.  I discussed with her that indeed she has evidence of venous insufficiency based on her reflux study today.  Most of her reflux appears to be in the deep system.  She does have a segment of reflux in the great saphenous vein  at the knee and proximal calf but discussed this is typically not a good location for laser ablation given risk for adjacent nerve injury.  I think her best option would be continued conservative management and her stockings are working quite well for her.  We did get her sized for another thigh-high stockings today 20 to 30 mmHg.  Discussed we can always reevaluate her in the future and her reflux pattern may change as time goes on.  Otherwise discussed weight loss, leg elevation, exercise, etc.   Marty Heck, MD [facility] Office: [phone]

## 2021-06-02 ENCOUNTER — Ambulatory Visit (HOSPITAL_COMMUNITY)
Admission: RE | Admit: 2021-06-02 | Discharge: 2021-06-02 | Disposition: A | Discharge status: Home / Self Care | Payer: Medicare Other | Source: Ambulatory Visit | Attending: Orthopedic Surgery | Admitting: Orthopedic Surgery

## 2021-06-02 DIAGNOSIS — M25562 Pain in left knee: Secondary | ICD-10-CM | POA: Diagnosis not present

## 2021-06-09 ENCOUNTER — Ambulatory Visit: Payer: Medicare Other

## 2021-06-11 ENCOUNTER — Other Ambulatory Visit: Payer: Self-pay

## 2021-06-11 ENCOUNTER — Ambulatory Visit (INDEPENDENT_AMBULATORY_CARE_PROVIDER_SITE_OTHER): Payer: Medicare Other

## 2021-06-11 DIAGNOSIS — J4550 Severe persistent asthma, uncomplicated: Secondary | ICD-10-CM

## 2021-07-02 DIAGNOSIS — G894 Chronic pain syndrome: Secondary | ICD-10-CM | POA: Diagnosis not present

## 2021-07-02 DIAGNOSIS — M4325 Fusion of spine, thoracolumbar region: Secondary | ICD-10-CM | POA: Diagnosis not present

## 2021-07-05 DIAGNOSIS — H9311 Tinnitus, right ear: Secondary | ICD-10-CM | POA: Diagnosis not present

## 2021-07-05 DIAGNOSIS — H903 Sensorineural hearing loss, bilateral: Secondary | ICD-10-CM | POA: Diagnosis not present

## 2021-07-12 DIAGNOSIS — M25532 Pain in left wrist: Secondary | ICD-10-CM | POA: Diagnosis not present

## 2021-07-12 DIAGNOSIS — G5602 Carpal tunnel syndrome, left upper limb: Secondary | ICD-10-CM | POA: Diagnosis not present

## 2021-07-27 ENCOUNTER — Other Ambulatory Visit: Payer: Self-pay | Admitting: *Deleted

## 2021-07-27 MED ORDER — FASENRA 30 MG/ML ~~LOC~~ SOSY: 30.0000 mg | PREFILLED_SYRINGE | SUBCUTANEOUS | 6 refills | Status: DC

## 2021-07-28 DIAGNOSIS — G894 Chronic pain syndrome: Secondary | ICD-10-CM | POA: Diagnosis not present

## 2021-07-28 DIAGNOSIS — M4326 Fusion of spine, lumbar region: Secondary | ICD-10-CM | POA: Diagnosis not present

## 2021-07-29 DIAGNOSIS — M25562 Pain in left knee: Secondary | ICD-10-CM | POA: Diagnosis not present

## 2021-08-02 ENCOUNTER — Ambulatory Visit: Payer: Self-pay

## 2021-08-04 ENCOUNTER — Ambulatory Visit (INDEPENDENT_AMBULATORY_CARE_PROVIDER_SITE_OTHER): Payer: Medicare HMO

## 2021-08-04 ENCOUNTER — Other Ambulatory Visit: Payer: Self-pay

## 2021-08-04 DIAGNOSIS — J4550 Severe persistent asthma, uncomplicated: Secondary | ICD-10-CM | POA: Diagnosis not present

## 2021-08-06 DIAGNOSIS — M1711 Unilateral primary osteoarthritis, right knee: Secondary | ICD-10-CM | POA: Diagnosis not present

## 2021-08-13 DIAGNOSIS — M1711 Unilateral primary osteoarthritis, right knee: Secondary | ICD-10-CM | POA: Diagnosis not present

## 2021-08-14 DIAGNOSIS — H5213 Myopia, bilateral: Secondary | ICD-10-CM | POA: Diagnosis not present

## 2021-08-16 ENCOUNTER — Other Ambulatory Visit: Payer: Self-pay | Admitting: Allergy & Immunology

## 2021-08-20 DIAGNOSIS — M1711 Unilateral primary osteoarthritis, right knee: Secondary | ICD-10-CM | POA: Diagnosis not present

## 2021-08-23 ENCOUNTER — Encounter: Payer: Self-pay | Admitting: Family Medicine

## 2021-08-23 ENCOUNTER — Ambulatory Visit (INDEPENDENT_AMBULATORY_CARE_PROVIDER_SITE_OTHER): Payer: Medicare HMO | Admitting: Family Medicine

## 2021-08-23 VITALS — BP 132/82 | HR 52 | Temp 97.8°F | Ht 67.0 in | Wt 187.2 lb

## 2021-08-23 DIAGNOSIS — J8281 Chronic eosinophilic pneumonia: Secondary | ICD-10-CM

## 2021-08-23 DIAGNOSIS — E782 Mixed hyperlipidemia: Secondary | ICD-10-CM | POA: Diagnosis not present

## 2021-08-23 DIAGNOSIS — Z0001 Encounter for general adult medical examination with abnormal findings: Secondary | ICD-10-CM

## 2021-08-23 DIAGNOSIS — Z01818 Encounter for other preprocedural examination: Secondary | ICD-10-CM | POA: Diagnosis not present

## 2021-08-23 DIAGNOSIS — Z0000 Encounter for general adult medical examination without abnormal findings: Secondary | ICD-10-CM

## 2021-08-23 DIAGNOSIS — R030 Elevated blood-pressure reading, without diagnosis of hypertension: Secondary | ICD-10-CM | POA: Diagnosis not present

## 2021-08-23 DIAGNOSIS — H524 Presbyopia: Secondary | ICD-10-CM | POA: Diagnosis not present

## 2021-08-23 DIAGNOSIS — Z8249 Family history of ischemic heart disease and other diseases of the circulatory system: Secondary | ICD-10-CM

## 2021-08-23 LAB — LIPID PANEL

## 2021-08-23 NOTE — Progress Notes (Signed)
[name] is a 61 y.o. female presents to office today for annual physical exam examination.    Concerns today include: 1.  Left-sided knee pain Patient will be seeing a surgeon again for left-sided knee pain.  She was told that she essentially needed a left knee replacement.  She will be seen by someone in the room for a new type of surgery that does not involve severing the muscle. Treated with pain medication currently.  2.  Chronic lung disease She reports that over the last year her breathing has improved substantially with the [name].  She is very pleased with how things are going.  She continues to take her Singulair, Xyzal and uses Symbicort but has only been using her Symbicort once daily since this controls her symptoms.  She is really had no need for the albuterol lately.  Occupation: working  Diet: fair, Exercise: activity limited by knee pain Last colonoscopy/ colguard: UTD Last mammogram: UTD Last pap smear: UTD Refills needed today: none Immunizations needed: Immunization History  Administered Date(s) Administered   Influenza, Seasonal, Injecte, Preservative Fre 05/28/2013   Influenza,inj,Quad PF,6+ Mos 06/07/2017, 04/16/2018   Pneumococcal Conjugate-13 11/29/2017   Td 06/28/2011   Tdap 06/29/2011     Past Medical History:  Diagnosis Date   Arthritis    Spine, R knee   Asthma    Back pain    Cough 09/03/2014   Followed in Pulmonary clinic/ [name]/ [name] - sinus CT 09/08/2014> Mild mucosal thickening involves the paranasal sinuses  - Eos 1.2  08/06/14 - Allergy profile 11/27/14 >  Eos 1.0/ IgE  41 with neg RAST     GERD (gastroesophageal reflux disease)    History of blood transfusion    Back surgery   Social History   Socioeconomic History   Marital status: Significant Other    Spouse name: [name]   Number of children: 2   Years of education: 12   Highest education level: Not on file  Occupational History   Occupation: disabled    Comment:  back  Tobacco Use   Smoking status: Never   Smokeless tobacco: Never  Vaping Use   Vaping Use: Never used  Substance and Sexual Activity   Alcohol use: No   Drug use: No   Sexual activity: Yes    Birth control/protection: Surgical  Other Topics Concern   Not on file  Social History Narrative   Lives with [name] - 59 years   [name] has end stage colon cancer   Two sons/ grandchildren   Disabled from back pain/had scoliosis. Has had back reconstruction.    Dr. Hulda Fritz doctor.    Social Determinants of Health   Financial Resource Strain: Low Risk    Difficulty of Paying Living Expenses: Not hard at all  Food Insecurity: No Food Insecurity   Worried About Charity fundraiser in the Last Year: Never true   [place] in the Last Year: Never true  Transportation Needs: No Transportation Needs   Lack of Transportation (Medical): No   Lack of Transportation (Non-Medical): No  Physical Activity: Sufficiently Active   Days of Exercise per Week: 3 days   Minutes of Exercise per Session: 60 min  Stress: No Stress Concern Present   Feeling of Stress : Only a little  Social Connections: Moderately Integrated   Frequency of Communication with Friends and Family: More than three times a week   Frequency of Social Gatherings with Friends and Family:  Once a week   Attends Religious Services: More than 4 times per year   Active Member of Clubs or Organizations: No   Attends Archivist Meetings: Never   Marital Status: Living with partner  Intimate Partner Violence: Not At Risk   Fear of Current or Ex-Partner: No   Emotionally Abused: No   Physically Abused: No   Sexually Abused: No   Past Surgical History:  Procedure Laterality Date   ABDOMINAL HYSTERECTOMY     "cysts", pelvic pain   BACK SURGERY     BIOPSY  05/19/2021   Procedure: BIOPSY;  Surgeon: Michelle Houston, MD;  [facility];  Service: Endoscopy;;   COLONOSCOPY WITH PROPOFOL N/A 05/19/2021    Procedure: COLONOSCOPY WITH PROPOFOL;  Surgeon: Michelle Houston, MD;  [facility];  Service: Endoscopy;  Laterality: N/A;  10:10   ESOPHAGEAL DILATION N/A 05/19/2021   Procedure: ESOPHAGEAL DILATION;  Surgeon: Michelle Houston, MD;  [facility];  Service: Endoscopy;  Laterality: N/A;   ESOPHAGOGASTRODUODENOSCOPY (EGD) WITH PROPOFOL N/A 05/19/2021   Procedure: ESOPHAGOGASTRODUODENOSCOPY (EGD) WITH PROPOFOL;  Surgeon: Michelle Houston, MD;  [facility];  Service: Endoscopy;  Laterality: N/A;   KNEE ARTHROSCOPY Right 2017   SPINE SURGERY  2010   fusion   [name]  2014   remove hardware   Family History  Problem Relation Age of Onset   Emphysema Maternal Grandmother        never smoker   COPD Maternal Grandmother    Emphysema Maternal Uncle        smoked   COPD Maternal Uncle    Arthritis Mother    Diabetes Mother    Hyperlipidemia Mother    Hypertension Mother    Early death Father 79       MVA   Asthma Sister    Asthma Brother    Heart disease Maternal Grandfather     Current Outpatient Medications:    albuterol (VENTOLIN HFA) 108 (90 Base) MCG/ACT inhaler, Inhale 2 puffs into the lungs every 4 (four) hours as needed for wheezing or shortness of breath., Disp: 1 each, Rfl: 2   azelastine (ASTELIN) 0.1 % nasal spray, INSTILL 2 SPRAYS IN EACH NOSTRIL TWICE DAILY AS NEEDED, Disp: 30 mL, Rfl: 5   Benralizumab (FASENRA) 30 MG/ML SOSY, Inject 1 mL (30 mg total) into the skin every 8 (eight) weeks., Disp: 1 mL, Rfl: 6   diclofenac (VOLTAREN) 75 MG EC tablet, Take 1 tablet (75 mg total) by mouth 2 (two) times daily with a meal., Disp: , Rfl: 2   EPINEPHrine 0.3 mg/0.3 mL IJ SOAJ injection, Inject into the muscle., Disp: , Rfl:    fluticasone (FLONASE) 50 MCG/ACT nasal spray, INSTILL 2 SPRAYS INTO EACH NOSTRIL TWICE DAILY, Disp: 16 g, Rfl: 1   hydrOXYzine (ATARAX/VISTARIL) 50 MG tablet, TAKE 1/2 TO 1 TABLET BY MOUTH EVERY 8 HOURS AS NEEDED FOR  ANXIETY, Disp: 30 tablet, Rfl: prn   levocetirizine (XYZAL) 5 MG tablet, TAKE 1 TABLET BY MOUTH EVERY MORNING, Disp: 90 tablet, Rfl: 2   lidocaine (XYLOCAINE) 5 % ointment, Apply 1 application topically as needed., Disp: 35.44 g, Rfl: 0   montelukast (SINGULAIR) 10 MG tablet, TAKE 1 TABLET BY MOUTH AT BEDTIME, Disp: 30 tablet, Rfl: 1   morphine (MSIR) 15 MG tablet, Take 15 mg by mouth every 8 (eight) hours as needed. Back Pain, Disp: , Rfl:    pantoprazole (PROTONIX) 40 MG  tablet, TAKE 1 TABLET BY MOUTH DAILY, Disp: 30 tablet, Rfl: 5   SYMBICORT 160-4.5 MCG/ACT inhaler, INHALE TWO PUFFS INTO THE LUNGS TWICE DAILY, Disp: 10.2 g, Rfl: 3   tiZANidine (ZANAFLEX) 4 MG tablet, Take 4 mg by mouth 2 (two) times daily as needed., Disp: , Rfl:    ipratropium-albuterol (DUONEB) 0.5-2.5 (3) MG/3ML SOLN, Take 3 mLs by nebulization every 4 (four) hours as needed., Disp: 360 mL, Rfl: 0  No Known Allergies   ROS: Review of Systems Pertinent items noted in HPI and remainder of comprehensive ROS otherwise negative.    Physical exam BP 132/82    Pulse (!) 52    Temp 97.8 F (36.6 C)    Ht '5\' 7"'  (1.702 m)    Wt 187 lb 3.2 oz (84.9 kg)    LMP 08/30/2002 (Approximate)    SpO2 99%    BMI 29.32 kg/m  General appearance: alert, cooperative, appears stated age, and no distress Head: Normocephalic, without obvious abnormality, atraumatic Eyes: negative findings: lids and lashes normal, conjunctivae and sclerae normal, corneas clear, and pupils equal, round, reactive to light and accomodation Ears: normal TM's and external ear canals both ears Nose: Nares normal. Septum midline. Mucosa normal. No drainage or sinus tenderness. Throat: lips, mucosa, and tongue normal; teeth and gums normal Neck: no adenopathy, no carotid bruit, supple, symmetrical, trachea midline, and thyroid not enlarged, symmetric, no tenderness/mass/nodules Back: thoracic scoliosis to the right. ROM normal. No CVA tenderness. Lungs: clear to  auscultation bilaterally Heart: regular rate and rhythm, S1, S2 normal, no murmur, click, rub or gallop Abdomen: soft, non-tender; bowel sounds normal; no masses,  no organomegaly Extremities:  arthritic changes to knees bilaterally Pulses: 2+ and symmetric Skin: Skin color, texture, turgor normal. No rashes or lesions Lymph nodes: Cervical, supraclavicular, and axillary nodes normal. Neurologic: Grossly normal Psych: mood stable.    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   Annual physical exam  Mixed hyperlipidemia - Plan: EKG 12-Lead, CMP14+EGFR, Lipid Panel  Pulmonary infiltrates with eosinophilia ([place]) - Plan: CBC  Family history of early CAD - Plan: EKG 12-Lead  Elevated blood pressure reading without diagnosis of hypertension  Pre-op evaluation - Plan: EKG 12-Lead, CMP14+EGFR, CBC  With the exception of vaccination she is up-to-date on preventative health care.  Fasting labs have been ordered.  EKG obtained in anticipation of her knee surgery.  We discussed we may need to repeat labs if her surgery is not expected within the next 2 months.  She is breathing beautifully today.  She seems like she is really responded well to the immunomodulators.  May need to have pulmonary clearance prior to her surgery  As far as I am concerned she is low risk for a moderate risk surgery with RCRI = 0 (3.9% risk).  CMP, lipid panel, EKG obtained.  EKG shows bradycardia but this is unchanged from previous EKG. blood pressure was initially elevated but upon recheck it was within acceptable range  Patient to follow up in 1 year for annual exam or sooner if needed.  Hector Taft M. Lajuana Ripple, DO

## 2021-08-23 NOTE — Patient Instructions (Signed)
You had labs performed today.  You will be contacted with the results of the labs once they are available, usually in the next 3 business days for routine lab work.  If you have an active my chart account, they will be released to your MyChart.  If you prefer to have these labs released to you via telephone, please let us know.   Preventive Care 55-59 Years Old, Female Preventive care refers to lifestyle choices and visits with your health care provider that can promote health and wellness. Preventive care visits are also called wellness exams. What can I expect for my preventive care visit? Counseling Your health care provider may ask you questions about your: Medical history, including: Past medical problems. Family medical history. Pregnancy history. Current health, including: Menstrual cycle. Method of birth control. Emotional well-being. Home life and relationship well-being. Sexual activity and sexual health. Lifestyle, including: Alcohol, nicotine or tobacco, and drug use. Access to firearms. Diet, exercise, and sleep habits. Work and work Statistician. Sunscreen use. Safety issues such as seatbelt and bike helmet use. Physical exam Your health care provider will check your: Height and weight. These may be used to calculate your BMI (body mass index). BMI is a measurement that tells if you are at a healthy weight. Waist circumference. This measures the distance around your waistline. This measurement also tells if you are at a healthy weight and may help predict your risk of certain diseases, such as type 2 diabetes and high blood pressure. Heart rate and blood pressure. Body temperature. Skin for abnormal spots. What immunizations do I need? Vaccines are usually given at various ages, according to a schedule. Your health care provider will recommend vaccines for you based on your age, medical history, and lifestyle or other factors, such as travel or where you work. What tests  do I need? Screening Your health care provider may recommend screening tests for certain conditions. This may include: Lipid and cholesterol levels. Diabetes screening. This is done by checking your blood sugar (glucose) after you have not eaten for a while (fasting). Pelvic exam and Pap test. Hepatitis B test. Hepatitis C test. HIV (human immunodeficiency virus) test. STI (sexually transmitted infection) testing, if you are at risk. Lung cancer screening. Colorectal cancer screening. Mammogram. Talk with your health care provider about when you should start having regular mammograms. This may depend on whether you have a family history of breast cancer. BRCA-related cancer screening. This may be done if you have a family history of breast, ovarian, tubal, or peritoneal cancers. Bone density scan. This is done to screen for osteoporosis. Talk with your health care provider about your test results, treatment options, and if necessary, the need for more tests. Follow these instructions at home: Eating and drinking  Eat a diet that includes fresh fruits and vegetables, whole grains, lean protein, and low-fat dairy products. Take vitamin and mineral supplements as recommended by your health care provider. Do not drink alcohol if: Your health care provider tells you not to drink. You are pregnant, may be pregnant, or are planning to become pregnant. If you drink alcohol: Limit how much you have to 0-1 drink a day. Know how much alcohol is in your drink. In the [place] one drink equals one 12 oz bottle of beer (355 mL), one 5 oz glass of wine (148 mL), or one 1 oz glass of hard liquor (44 mL). Lifestyle Brush your teeth every morning and night with fluoride toothpaste. Floss one time each day.  Exercise for at least 30 minutes 5 or more days each week. Do not use any products that contain nicotine or tobacco. These products include cigarettes, chewing tobacco, and vaping devices, such as  e-cigarettes. If you need help quitting, ask your health care provider. Do not use drugs. If you are sexually active, practice safe sex. Use a condom or other form of protection to prevent STIs. If you do not wish to become pregnant, use a form of birth control. If you plan to become pregnant, see your health care provider for a prepregnancy visit. Take aspirin only as told by your health care provider. Make sure that you understand how much to take and what form to take. Work with your health care provider to find out whether it is safe and beneficial for you to take aspirin daily. Find healthy ways to manage stress, such as: Meditation, yoga, or listening to music. Journaling. Talking to a trusted person. Spending time with friends and family. Minimize exposure to UV radiation to reduce your risk of skin cancer. Safety Always wear your seat belt while driving or riding in a vehicle. Do not drive: If you have been drinking alcohol. Do not ride with someone who has been drinking. When you are tired or distracted. While texting. If you have been using any mind-altering substances or drugs. Wear a helmet and other protective equipment during sports activities. If you have firearms in your house, make sure you follow all gun safety procedures. Seek help if you have been physically or sexually abused. What's next? Visit your health care provider once a year for an annual wellness visit. Ask your health care provider how often you should have your eyes and teeth checked. Stay up to date on all vaccines. This information is not intended to replace advice given to you by your health care provider. Make sure you discuss any questions you have with your health care provider. Document Revised: 12/09/2020 Document Reviewed: 12/09/2020 Elsevier Patient Education  [place].

## 2021-08-24 ENCOUNTER — Encounter: Payer: Self-pay | Admitting: Family Medicine

## 2021-08-24 DIAGNOSIS — G894 Chronic pain syndrome: Secondary | ICD-10-CM | POA: Diagnosis not present

## 2021-08-24 DIAGNOSIS — M4326 Fusion of spine, lumbar region: Secondary | ICD-10-CM | POA: Diagnosis not present

## 2021-08-24 LAB — CBC
Hematocrit: 40.3 % (ref 34.0–46.6)
Hemoglobin: 13.6 g/dL (ref 11.1–15.9)
MCH: 30.2 pg (ref 26.6–33.0)
MCHC: 33.7 g/dL (ref 31.5–35.7)
MCV: 89 fL (ref 79–97)
Platelets: 247 10*3/uL (ref 150–450)
RBC: 4.51 x10E6/uL (ref 3.77–5.28)
RDW: 13.1 % (ref 11.7–15.4)
WBC: 4.7 10*3/uL (ref 3.4–10.8)

## 2021-08-24 LAB — CMP14+EGFR
ALT: 31 IU/L (ref 0–32)
AST: 29 IU/L (ref 0–40)
Albumin/Globulin Ratio: 1.9 (ref 1.2–2.2)
Albumin: 4.4 g/dL (ref 3.8–4.9)
Alkaline Phosphatase: 119 IU/L (ref 44–121)
BUN/Creatinine Ratio: 14 (ref 9–23)
BUN: 10 mg/dL (ref 6–24)
Bilirubin Total: 0.4 mg/dL (ref 0.0–1.2)
CO2: 23 mmol/L (ref 20–29)
Calcium: 9.8 mg/dL (ref 8.7–10.2)
Chloride: 102 mmol/L (ref 96–106)
Creatinine, Ser: 0.69 mg/dL (ref 0.57–1.00)
Globulin, Total: 2.3 g/dL (ref 1.5–4.5)
Glucose: 94 mg/dL (ref 70–99)
Potassium: 4.4 mmol/L (ref 3.5–5.2)
Sodium: 146 mmol/L — ABNORMAL HIGH (ref 134–144)
Total Protein: 6.7 g/dL (ref 6.0–8.5)
eGFR: 101 mL/min/{1.73_m2} (ref >59)

## 2021-08-24 LAB — LIPID PANEL
Chol/HDL Ratio: 4.7 ratio — ABNORMAL HIGH (ref 0.0–4.4)
Cholesterol, Total: 218 mg/dL — ABNORMAL HIGH (ref 100–199)
HDL: 46 mg/dL (ref >39)
LDL Chol Calc (NIH): 136 mg/dL — ABNORMAL HIGH (ref 0–99)
Triglycerides: 199 mg/dL — ABNORMAL HIGH (ref 0–149)
VLDL Cholesterol Cal: 36 mg/dL (ref 5–40)

## 2021-08-25 DIAGNOSIS — M170 Bilateral primary osteoarthritis of knee: Secondary | ICD-10-CM | POA: Diagnosis not present

## 2021-08-25 DIAGNOSIS — M25462 Effusion, left knee: Secondary | ICD-10-CM | POA: Diagnosis not present

## 2021-08-25 DIAGNOSIS — M1712 Unilateral primary osteoarthritis, left knee: Secondary | ICD-10-CM | POA: Diagnosis not present

## 2021-08-25 DIAGNOSIS — G8929 Other chronic pain: Secondary | ICD-10-CM | POA: Diagnosis not present

## 2021-08-25 DIAGNOSIS — M25562 Pain in left knee: Secondary | ICD-10-CM | POA: Diagnosis not present

## 2021-09-01 ENCOUNTER — Telehealth: Payer: Self-pay

## 2021-09-01 NOTE — Telephone Encounter (Signed)
Patient called to see if we received her surgery clearance letter from [facility]. I did receive it and Dr Dellis Anes signed it. I have faxed it back along with her last OV as requested.  ? ?Patient has been informed. Letter has been labeled and sent to the scan center.  ?

## 2021-09-02 NOTE — Telephone Encounter (Signed)
I just checked a box on the form this morning that we missed. [name] is going to fax it back.  ? ?Malachi Bonds, MD ?[facility] ? ?

## 2021-09-03 ENCOUNTER — Encounter: Payer: Self-pay | Admitting: Allergy & Immunology

## 2021-09-03 ENCOUNTER — Other Ambulatory Visit: Payer: Self-pay

## 2021-09-03 ENCOUNTER — Ambulatory Visit: Payer: Medicare HMO | Admitting: Allergy & Immunology

## 2021-09-03 VITALS — BP 122/86 | HR 82 | Temp 97.7°F | Resp 18 | Ht 67.0 in | Wt 188.0 lb

## 2021-09-03 DIAGNOSIS — J4550 Severe persistent asthma, uncomplicated: Secondary | ICD-10-CM | POA: Diagnosis not present

## 2021-09-03 DIAGNOSIS — R918 Other nonspecific abnormal finding of lung field: Secondary | ICD-10-CM | POA: Diagnosis not present

## 2021-09-03 DIAGNOSIS — J3089 Other allergic rhinitis: Secondary | ICD-10-CM | POA: Diagnosis not present

## 2021-09-03 DIAGNOSIS — J302 Other seasonal allergic rhinitis: Secondary | ICD-10-CM

## 2021-09-03 MED ORDER — AZELASTINE HCL 0.1 % NA SOLN: 2.0000 | Freq: Two times a day (BID) | NASAL | 5 refills | Status: DC

## 2021-09-03 MED ORDER — BUDESONIDE-FORMOTEROL FUMARATE 160-4.5 MCG/ACT IN AERO: 2.0000 | INHALATION_SPRAY | Freq: Two times a day (BID) | RESPIRATORY_TRACT | 5 refills | Status: DC

## 2021-09-03 MED ORDER — MONTELUKAST SODIUM 10 MG PO TABS: 10.0000 mg | ORAL_TABLET | Freq: Every evening | ORAL | 1 refills | Status: DC

## 2021-09-03 MED ORDER — FLUTICASONE PROPIONATE 50 MCG/ACT NA SUSP: 2.0000 | Freq: Two times a day (BID) | NASAL | 5 refills | Status: DC

## 2021-09-03 MED ORDER — LEVOCETIRIZINE DIHYDROCHLORIDE 5 MG PO TABS: 5.0000 mg | ORAL_TABLET | Freq: Two times a day (BID) | ORAL | 1 refills | Status: DC | PRN

## 2021-09-03 MED ORDER — IPRATROPIUM-ALBUTEROL 0.5-2.5 (3) MG/3ML IN SOLN: 3.0000 mL | RESPIRATORY_TRACT | 0 refills | Status: DC | PRN

## 2021-09-03 NOTE — Patient Instructions (Addendum)
1. Moderate persistent asthma, uncomplicated ?- Lung testing looked AMAZING today!  ?- I think we are on a good course from a pulmonary perspective at least.  ?- Daily controller medication(s): Symbicort 160/4.71mcg two puffs once daily with spacer in the morning ?- Prior to physical activity: ProAir 2 puffs 10-15 minutes before physical activity. ?- Rescue medications: ProAir 4 puffs every 4-6 hours as needed or albuterol nebulizer one vial every 4-6 hours as needed ?- Changes during respiratory infections or worsening symptoms: Increase Symbicort to two puffs TWICE DAILY for one to two weeks. ?- Asthma control goals:  ?* Full participation in all desired activities (may need albuterol before activity) ?* Albuterol use two time or less a week on average (not counting use with activity) ?* Cough interfering with sleep two time or less a month ?* Oral steroids no more than once a year ?* No hospitalizations ? ?2. Chronic rhinitis (weeds, indoor molds, outdoor molds, cat, dog, and cockroach) ?- Continue with fluticasone nasal spray 1-2 sprays per nostril daily. ?- Continue with Astelin 2 sprays per nostril up to twice daily as needed.  ?- Continue with cetirizine 10mg  daily. ? ?3. Return in about 6 months (around 03/06/2022).  ? ? ?Please inform us of any Emergency Department visits, hospitalizations, or changes in symptoms. Call us before going to the ED for breathing or allergy symptoms since we might be able to fit you in for a sick visit. Feel free to contact us anytime with any questions, problems, or concerns. ? ?It was a pleasure to see you again today! ? ?Websites that have reliable patient information: ?1. American Academy of Asthma, Allergy, and Immunology: www.aaaai.org ?2. Food Allergy Research and Education (FARE): foodallergy.org ?3. Mothers of Asthmatics: http://www.asthmacommunitynetwork.org ?4. [employer] of Allergy, Asthma, and Immunology: MonthlyElectricBill.co.uk ? ? ?COVID-19 Vaccine Information can be  found at: ShippingScam.co.uk For questions related to vaccine distribution or appointments, please email [email or web address] or call [phone].  ? ?We realize that you might be concerned about having an allergic reaction to the COVID19 vaccines. To help with that concern, WE ARE OFFERING THE COVID19 VACCINES IN OUR OFFICE! Ask the front desk for dates!  ? ? ? ??Like? [place] on Facebook and Instagram for our latest updates!  ?  ? ? ?A healthy democracy works best when [employer] participate! Make sure you are registered to vote! If you have moved or changed any of your contact information, you will need to get this updated before voting! ? ?In some cases, you MAY be able to register to vote online: [email or web address] ? ? ? ? ?

## 2021-09-03 NOTE — Progress Notes (Signed)
? ?FOLLOW UP ? ?Date of Service/Encounter:  09/03/21 ? ? ?Assessment:  ? ?Moderate persistent asthma - doing very well on [place] today ?  ?Seasonal and perennial allergic rhinitis (indoor and outdoor molds, cat, dog, cockroach) ?  ?Chronic back pain - with a history of scoliosis (rods placement in 2010) and now on disability  ?  ?History of ground glass opacities in 2016 - repeat chest CT markedly improved ?  ?Recent COVID-19 infection in August 2022 ? ?Needs bilateral knee replacement - signed clearance forms ?  ? ?Plan/Recommendations:  ? ?1. Moderate persistent asthma, uncomplicated ?- Lung testing looked AMAZING today!  ?- I think we are on a good course from a pulmonary perspective at least.  ?- Daily controller medication(s): Symbicort 160/4.415mcg two puffs once daily with spacer in the morning ?- Prior to physical activity: ProAir 2 puffs 10-15 minutes before physical activity. ?- Rescue medications: ProAir 4 puffs every 4-6 hours as needed or albuterol nebulizer one vial every 4-6 hours as needed ?- Changes during respiratory infections or worsening symptoms: Increase Symbicort to two puffs TWICE DAILY for one to two weeks. ?- Asthma control goals:  ?* Full participation in all desired activities (may need albuterol before activity) ?* Albuterol use two time or less a week on average (not counting use with activity) ?* Cough interfering with sleep two time or less a month ?* Oral steroids no more than once a year ?* No hospitalizations ? ?2. Chronic rhinitis (weeds, indoor molds, outdoor molds, cat, dog, and cockroach) ?- Continue with fluticasone nasal spray 1-2 sprays per nostril daily. ?- Continue with Astelin 2 sprays per nostril up to twice daily as needed.  ?- Continue with cetirizine 10mg  daily. ? ?3. Return in about 6 months (around 03/06/2022).  ? ?Subjective:  ? ?[name] is a 61 y.o. female presenting today for follow up of  ?Chief Complaint  ?Patient presents with  ? Asthma  ? ? ?[name]  has a history of the following: ?Patient Active Problem List  ? Diagnosis Date Noted  ? GERD (gastroesophageal reflux disease)   ? Positive colorectal cancer screening using Cologuard test   ? Esophageal dysphagia   ? Varicose veins of bilateral lower extremities with pain 01/02/2020  ? Seasonal and perennial allergic rhinitis 04/15/2018  ? 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 Chronic insomnia 11/29/2016  ? Eosinophilia 03/29/2016  ? Moderate persistent asthma, uncomplicated 03/29/2016  ? Mixed rhinitis 03/29/2016  ? Other social stressor 03/29/2016  ? Chronic migraine w/o aura w/o status migrainosus, not intractable 03/14/2016  ? Arthritis of knee, degenerative 07/09/2015  ? Chronic low back pain 04/15/2015  ? Cough variant asthma 08/09/2014  ? Pulmonary infiltrates with eosinophilia (HCC) 08/09/2014  ? ? ?History obtained from: chart review and patient. ? ?[name] is a 61 y.o. female presenting for a follow up visit.  She was last seen in September 2022.  At that time, her lung testing was not done since it was fine last time.  We continue with Symbicort 160 mcg 2 puffs once daily in the morning as well as albuterol as needed.  Also on Fasenra every 8 weeks.  Tinnitus, we continue with Flonase as well as Astelin and cetirizine. ? ?She is currently being scheduled for knee surgery. She is going to do a replacement of the left knee followed by the right knee later this year. She has done some gel injections on the right recently and this requires 6 months wait  time.  ? ?Asthma/Respiratory Symptom History: She remains on the Symbicort. She uses it once daily and this is enough to keep her symptoms at bay. She forgets to take it. She thinks that the [place] Challenger  has helped quite a bit with her symptoms. [name]'s asthma has been well controlled. She has not required rescue medication, experienced nocturnal awakenings due to lower respiratory symptoms, nor have activities of daily living been limited. She has  required no Emergency Department or Urgent Care visits for her asthma. She has required zero courses of systemic steroids for asthma exacerbations since the last visit. ACT score today is 25, indicating excellent asthma symptom control.  ? ?Allergic Rhinitis Symptom History: She remains on her cetirizine daily. She is not using her nasal sprays on a daily basis but only as needed. She has not been on antibiotics. Symptoms have been under good control. She reports that the spring time is definitely the worst for her.  ? ?She has 9 grand kids, aged 4 through 36. She has to get her knees replaced so that she can keep up with them.  ? ?Otherwise, there have been no changes to her past medical history, surgical history, family history, or social history. ? ? ? ?Review of Systems  ?Constitutional: Negative.  Negative for chills, fever, malaise/fatigue and weight loss.  ?HENT: Negative.  Negative for congestion, ear discharge, ear pain and sinus pain.   ?Eyes:  Negative for pain, discharge and redness.  ?Respiratory:  Negative for cough, sputum production, shortness of breath and wheezing.   ?Cardiovascular: Negative.  Negative for chest pain and palpitations.  ?Gastrointestinal:  Negative for abdominal pain, constipation, diarrhea, heartburn, nausea and vomiting.  ?Skin: Negative.  Negative for itching and rash.  ?Neurological:  Negative for dizziness and headaches.  ?Endo/Heme/Allergies:  Negative for environmental allergies. Does not bruise/bleed easily.   ? ? ? ?Objective:  ? ?Blood pressure 122/86, pulse 82, temperature 97.7 ?F (36.5 ?C), resp. rate 18, height 5\' 7"  (1.702 m), weight 188 lb (85.3 kg), last menstrual period 08/30/2002, SpO2 99 %. ?Body mass index is 29.44 kg/m?. ? ? ? ?Physical Exam ?Vitals reviewed.  ?Constitutional:   ?   Appearance: She is well-developed.  ?HENT:  ?   Head: Normocephalic and atraumatic.  ?   Right Ear: Tympanic membrane, ear canal and external ear normal.  ?   Left Ear: Tympanic  membrane, ear canal and external ear normal.  ?   Nose: No nasal deformity, septal deviation, mucosal edema or rhinorrhea.  ?   Right Turbinates: Enlarged, swollen and pale.  ?   Left Turbinates: Enlarged, swollen and pale.  ?   Right Sinus: No maxillary sinus tenderness or frontal sinus tenderness.  ?   Left Sinus: No maxillary sinus tenderness or frontal sinus tenderness.  ?   Mouth/Throat:  ?   Mouth: Mucous membranes are not pale and not dry.  ?   Pharynx: Uvula midline.  ?Eyes:  ?   General: Lids are normal. Allergic shiner present.     ?   Right eye: No discharge.     ?   Left eye: No discharge.  ?   Conjunctiva/sclera: Conjunctivae normal.  ?   Right eye: Right conjunctiva is not injected. No chemosis. ?   Left eye: Left conjunctiva is not injected. No chemosis. ?   Pupils: Pupils are equal, round, and reactive to light.  ?Cardiovascular:  ?   Rate and Rhythm: Normal rate and regular rhythm.  ?  Heart sounds: Normal heart sounds.  ?Pulmonary:  ?   Effort: Pulmonary effort is normal. No tachypnea, accessory muscle usage or respiratory distress.  ?   Breath sounds: Normal breath sounds. No wheezing, rhonchi or rales.  ?   Comments: Moving air well in all lung fields.  No increased work of breathing. ?Chest:  ?   Chest wall: No tenderness.  ?Lymphadenopathy:  ?   Cervical: No cervical adenopathy.  ?Skin: ?   General: Skin is warm.  ?   Capillary Refill: Capillary refill takes less than 2 seconds.  ?   Coloration: Skin is not pale.  ?   Findings: No abrasion, erythema, petechiae or rash. Rash is not papular, urticarial or vesicular.  ?   Comments: No eczematous or urticarial lesions noted.  ?Neurological:  ?   Mental Status: She is alert.  ?Psychiatric:     ?   Behavior: Behavior is cooperative.  ?  ? ?Diagnostic studies:   ? ?Spirometry: Normal FEV1, FVC, and FEV1/FVC ratio. There is no scooping suggestive of obstructive disease.  ? ? ?Allergy Studies: none ? ? ? ? ?  ?Malachi Bonds, MD  ?[facility] ? ? ? ? ? ? ?

## 2021-09-13 DIAGNOSIS — M1712 Unilateral primary osteoarthritis, left knee: Secondary | ICD-10-CM | POA: Diagnosis not present

## 2021-09-14 NOTE — Progress Notes (Signed)
Sent message, via epic in basket, requesting orders in epic from surgeon.  

## 2021-09-16 ENCOUNTER — Ambulatory Visit: Payer: Self-pay | Admitting: Physician Assistant

## 2021-09-16 DIAGNOSIS — G8929 Other chronic pain: Secondary | ICD-10-CM

## 2021-09-16 NOTE — H&P (View-Only) (Signed)
TOTAL KNEE ADMISSION H&P ? ?Patient is being admitted for left total knee arthroplasty. ? ?Subjective: ? ?Chief Complaint:left knee pain. ? ?HPI: [name], 61 y.o. female, has a history of pain and functional disability in the left knee due to arthritis and has failed non-surgical conservative treatments for greater than 12 weeks to includeNSAID's and/or analgesics, corticosteriod injections, viscosupplementation injections, use of assistive devices, and activity modification.  Onset of symptoms was gradual, starting 5 years ago with gradually worsening course since that time. The patient noted prior procedures on the knee to include  arthroscopy and menisectomy on the left knee(s).  Patient currently rates pain in the left knee(s) at 10 out of 10 with activity. Patient has night pain, worsening of pain with activity and weight bearing, pain that interferes with activities of daily living, pain with passive range of motion, crepitus, and joint swelling.  Patient has evidence of periarticular osteophytes and joint space narrowing by imaging studies. There is no active infection. ? ?Patient Active Problem List  ? Diagnosis Date Noted  ? GERD (gastroesophageal reflux disease)   ? Positive colorectal cancer screening using Cologuard test   ? Esophageal dysphagia   ? Varicose veins of bilateral lower extremities with pain 01/02/2020  ? Seasonal and perennial allergic rhinitis 04/15/2018  ? 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 Chronic insomnia 11/29/2016  ? Eosinophilia 03/29/2016  ? Moderate persistent asthma, uncomplicated [id number]  ? Mixed rhinitis 03/29/2016  ? Other social stressor 03/29/2016  ? Chronic migraine w/o aura w/o status migrainosus, not intractable 03/14/2016  ? Arthritis of knee, degenerative 07/09/2015  ? Chronic low back pain 04/15/2015  ? Cough variant asthma 08/09/2014  ? Pulmonary infiltrates with eosinophilia ([place]) 08/09/2014  ? ?Past Medical History:  ?Diagnosis Date  ?  Arthritis   ? Spine, R knee  ? Asthma   ? Back pain   ? Cough 09/03/2014  ? Followed in Pulmonary clinic/ [facility]/ [place] - sinus CT 09/08/2014> Mild mucosal thickening involves the paranasal sinuses  - Eos 1.2  08/06/14 - Allergy profile 11/27/14 >  Eos 1.0/ IgE  41 with neg RAST    ? GERD (gastroesophageal reflux disease)   ? History of blood transfusion   ? Back surgery  ?  ?Past Surgical History:  ?Procedure Laterality Date  ? ABDOMINAL HYSTERECTOMY    ? "cysts", pelvic pain  ? BACK SURGERY    ? BIOPSY  05/19/2021  ? Procedure: BIOPSY;  Surgeon: Rogene Houston, MD;  [facility];  Service: Endoscopy;;  ? COLONOSCOPY WITH PROPOFOL N/A 05/19/2021  ? Procedure: COLONOSCOPY WITH PROPOFOL;  Surgeon: Rogene Houston, MD;  [facility];  Service: Endoscopy;  Laterality: N/A;  10:10  ? ESOPHAGEAL DILATION N/A 05/19/2021  ? Procedure: ESOPHAGEAL DILATION;  Surgeon: Rogene Houston, MD;  [facility];  Service: Endoscopy;  Laterality: N/A;  ? ESOPHAGOGASTRODUODENOSCOPY (EGD) WITH PROPOFOL N/A 05/19/2021  ? Procedure: ESOPHAGOGASTRODUODENOSCOPY (EGD) WITH PROPOFOL;  Surgeon: Rogene Houston, MD;  [facility];  Service: Endoscopy;  Laterality: N/A;  ? KNEE ARTHROSCOPY Right 2017  ? [place] SURGERY  2010  ? fusion  ? [place] SURGERY  2014  ? remove hardware  ?  ?Current Outpatient Medications  ?Medication Sig Dispense Refill Last Dose  ? albuterol (VENTOLIN HFA) 108 (90 Base) MCG/ACT inhaler Inhale 2 puffs into the lungs every 4 (four) hours as needed for wheezing or shortness of breath. 1 each 2   ?  azelastine (ASTELIN) 0.1 % nasal spray Place 2 sprays into both nostrils 2 (two) times daily. Use in each nostril as directed 30 mL 5   ? Benralizumab (FASENRA) 30 MG/ML SOSY Inject 1 mL (30 mg total) into the skin every 8 (eight) weeks. 1 mL 6   ? budesonide-formoterol (SYMBICORT) 160-4.5 MCG/ACT inhaler Inhale 2 puffs into the lungs 2 (two) times daily. 10.2 g 5   ? diclofenac  (VOLTAREN) 75 MG EC tablet Take 1 tablet (75 mg total) by mouth 2 (two) times daily with a meal.  2   ? EPINEPHrine 0.3 mg/0.3 mL IJ SOAJ injection Inject into the muscle.     ? fluticasone (FLONASE) 50 MCG/ACT nasal spray Place 2 sprays into both nostrils in the morning and at bedtime. 16 g 5   ? hydrOXYzine (ATARAX/VISTARIL) 50 MG tablet TAKE 1/2 TO 1 TABLET BY MOUTH EVERY 8 HOURS AS NEEDED FOR ANXIETY 30 tablet prn   ? ipratropium-albuterol (DUONEB) 0.5-2.5 (3) MG/3ML SOLN Take 3 mLs by nebulization every 4 (four) hours as needed. 360 mL 0   ? levocetirizine (XYZAL) 5 MG tablet Take 1 tablet (5 mg total) by mouth 2 (two) times daily as needed for allergies (Can take an extra dose during flare ups.). 180 tablet 1   ? lidocaine (XYLOCAINE) 5 % ointment Apply 1 application topically as needed. 35.44 g 0   ? montelukast (SINGULAIR) 10 MG tablet Take 1 tablet (10 mg total) by mouth at bedtime. 90 tablet 1   ? morphine (MSIR) 15 MG tablet Take 15 mg by mouth every 8 (eight) hours as needed. Back Pain     ? pantoprazole (PROTONIX) 40 MG tablet TAKE 1 TABLET BY MOUTH DAILY 30 tablet 5   ? tiZANidine (ZANAFLEX) 4 MG tablet Take 4 mg by mouth 2 (two) times daily as needed.     ? ?No current facility-administered medications for this visit.  ? ?No Known Allergies  ?Social History  ? ?Tobacco Use  ? Smoking status: Never  ? Smokeless tobacco: Never  ?Substance Use Topics  ? Alcohol use: No  ?  ?Family History  ?Problem Relation Age of Onset  ? Emphysema Maternal Grandmother   ?     never smoker  ? COPD Maternal Grandmother   ? Emphysema Maternal Uncle   ?     smoked  ? COPD Maternal Uncle   ? Arthritis Mother   ? Diabetes Mother   ? Hyperlipidemia Mother   ? Hypertension Mother   ? Early death Father 36  ?     MVA  ? Asthma Sister   ? Asthma Brother   ? Heart disease Maternal Grandfather   ?  ? ?Review of Systems  ?HENT:  Positive for tinnitus.   ?Respiratory:  Positive for shortness of breath and wheezing.    ?Musculoskeletal:  Positive for arthralgias.  ?All other systems reviewed and are negative. ? ?Objective: ? ?Physical Exam ?Constitutional:   ?   General: She is not in acute distress. ?   Appearance: Normal appearance.  ?HENT:  ?   Head: Normocephalic and atraumatic.  ?Eyes:  ?   Extraocular Movements: Extraocular movements intact.  ?   Pupils: Pupils are equal, round, and reactive to light.  ?Cardiovascular:  ?   Rate and Rhythm: Normal rate and regular rhythm.  ?   Pulses: Normal pulses.  ?   Heart sounds: Normal heart sounds.  ?Pulmonary:  ?   Effort: Pulmonary effort is normal. No   respiratory distress.  ?   Breath sounds: Normal breath sounds. No wheezing.  ?Abdominal:  ?   General: Abdomen is flat. Bowel sounds are normal. There is no distension.  ?   Palpations: Abdomen is soft.  ?   Tenderness: There is no abdominal tenderness.  ?Musculoskeletal:  ?   Cervical back: Normal range of motion and neck supple.  ?   Comments: : Examination of the left lower extremity again shows she is neurovascularly intact.  She lacks a couple of degrees of terminal extension with the left knee actively.  Good flexion.  She is stable to varus and valgus.  Joint line tenderness to the knee, more so medially.  Positive patellofemoral crepitus.    ?Lymphadenopathy:  ?   Cervical: No cervical adenopathy.  ?Skin: ?   General: Skin is warm and dry.  ?   Findings: No erythema or rash.  ?Neurological:  ?   General: No focal deficit present.  ?   Mental Status: She is alert and oriented to person, place, and time.  ?Psychiatric:     ?   Mood and Affect: Mood normal.     ?   Behavior: Behavior normal.  ? ? ?Vital signs in last 24 hours: ?@VSRANGES @ ? ?Labs: ? ? ?Estimated body mass index is 29.44 kg/m? as calculated from the following: ?  Height as of 09/03/21: 5\' 7"  (1.702 m). ?  Weight as of 09/03/21: 85.3 kg. ? ? ?Imaging Review ?Plain radiographs demonstrate moderate degenerative joint disease of the left knee(s). The overall alignment  ismild varus. The bone quality appears to be good for age and reported activity level. ? ? ? ? ? ?Assessment/Plan: ? ?End stage arthritis, left knee  ? ?The patient history, physical examination, clinical judgment of the p

## 2021-09-16 NOTE — H&P (Signed)
TOTAL KNEE ADMISSION H&P ? ?Patient is being admitted for left total knee arthroplasty. ? ?Subjective: ? ?Chief Complaint:left knee pain. ? ?HPI: [name], 61 y.o. female, has a history of pain and functional disability in the left knee due to arthritis and has failed non-surgical conservative treatments for greater than 12 weeks to includeNSAID's and/or analgesics, corticosteriod injections, viscosupplementation injections, use of assistive devices, and activity modification.  Onset of symptoms was gradual, starting 5 years ago with gradually worsening course since that time. The patient noted prior procedures on the knee to include  arthroscopy and menisectomy on the left knee(s).  Patient currently rates pain in the left knee(s) at 10 out of 10 with activity. Patient has night pain, worsening of pain with activity and weight bearing, pain that interferes with activities of daily living, pain with passive range of motion, crepitus, and joint swelling.  Patient has evidence of periarticular osteophytes and joint space narrowing by imaging studies. There is no active infection. ? ?Patient Active Problem List  ? Diagnosis Date Noted  ? GERD (gastroesophageal reflux disease)   ? Positive colorectal cancer screening using Cologuard test   ? Esophageal dysphagia   ? Varicose veins of bilateral lower extremities with pain 01/02/2020  ? Seasonal and perennial allergic rhinitis 04/15/2018  ? 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 Chronic insomnia 11/29/2016  ? Eosinophilia 03/29/2016  ? Moderate persistent asthma, uncomplicated Q000111Q  ? Mixed rhinitis 03/29/2016  ? Other social stressor 03/29/2016  ? Chronic migraine w/o aura w/o status migrainosus, not intractable 03/14/2016  ? Arthritis of knee, degenerative 07/09/2015  ? Chronic low back pain 04/15/2015  ? Cough variant asthma 08/09/2014  ? Pulmonary infiltrates with eosinophilia ([place]) 08/09/2014  ? ?Past Medical History:  ?Diagnosis Date  ?  Arthritis   ? Spine, R knee  ? Asthma   ? Back pain   ? Cough 09/03/2014  ? Followed in Pulmonary clinic/ [facility]/ [place] - sinus CT 09/08/2014> Mild mucosal thickening involves the paranasal sinuses  - Eos 1.2  08/06/14 - Allergy profile 11/27/14 >  Eos 1.0/ IgE  41 with neg RAST    ? GERD (gastroesophageal reflux disease)   ? History of blood transfusion   ? Back surgery  ?  ?Past Surgical History:  ?Procedure Laterality Date  ? ABDOMINAL HYSTERECTOMY    ? "cysts", pelvic pain  ? BACK SURGERY    ? BIOPSY  05/19/2021  ? Procedure: BIOPSY;  Surgeon: Rogene Houston, MD;  [facility];  Service: Endoscopy;;  ? COLONOSCOPY WITH PROPOFOL N/A 05/19/2021  ? Procedure: COLONOSCOPY WITH PROPOFOL;  Surgeon: Rogene Houston, MD;  [facility];  Service: Endoscopy;  Laterality: N/A;  10:10  ? ESOPHAGEAL DILATION N/A 05/19/2021  ? Procedure: ESOPHAGEAL DILATION;  Surgeon: Rogene Houston, MD;  [facility];  Service: Endoscopy;  Laterality: N/A;  ? ESOPHAGOGASTRODUODENOSCOPY (EGD) WITH PROPOFOL N/A 05/19/2021  ? Procedure: ESOPHAGOGASTRODUODENOSCOPY (EGD) WITH PROPOFOL;  Surgeon: Rogene Houston, MD;  [facility];  Service: Endoscopy;  Laterality: N/A;  ? KNEE ARTHROSCOPY Right 2017  ? [place] SURGERY  2010  ? fusion  ? [place] SURGERY  2014  ? remove hardware  ?  ?Current Outpatient Medications  ?Medication Sig Dispense Refill Last Dose  ? albuterol (VENTOLIN HFA) 108 (90 Base) MCG/ACT inhaler Inhale 2 puffs into the lungs every 4 (four) hours as needed for wheezing or shortness of breath. 1 each 2   ?  azelastine (ASTELIN) 0.1 % nasal spray Place 2 sprays into both nostrils 2 (two) times daily. Use in each nostril as directed 30 mL 5   ? Benralizumab (FASENRA) 30 MG/ML SOSY Inject 1 mL (30 mg total) into the skin every 8 (eight) weeks. 1 mL 6   ? budesonide-formoterol (SYMBICORT) 160-4.5 MCG/ACT inhaler Inhale 2 puffs into the lungs 2 (two) times daily. 10.2 g 5   ? diclofenac  (VOLTAREN) 75 MG EC tablet Take 1 tablet (75 mg total) by mouth 2 (two) times daily with a meal.  2   ? EPINEPHrine 0.3 mg/0.3 mL IJ SOAJ injection Inject into the muscle.     ? fluticasone (FLONASE) 50 MCG/ACT nasal spray Place 2 sprays into both nostrils in the morning and at bedtime. 16 g 5   ? hydrOXYzine (ATARAX/VISTARIL) 50 MG tablet TAKE 1/2 TO 1 TABLET BY MOUTH EVERY 8 HOURS AS NEEDED FOR ANXIETY 30 tablet prn   ? ipratropium-albuterol (DUONEB) 0.5-2.5 (3) MG/3ML SOLN Take 3 mLs by nebulization every 4 (four) hours as needed. 360 mL 0   ? levocetirizine (XYZAL) 5 MG tablet Take 1 tablet (5 mg total) by mouth 2 (two) times daily as needed for allergies (Can take an extra dose during flare ups.). 180 tablet 1   ? lidocaine (XYLOCAINE) 5 % ointment Apply 1 application topically as needed. 35.44 g 0   ? montelukast (SINGULAIR) 10 MG tablet Take 1 tablet (10 mg total) by mouth at bedtime. 90 tablet 1   ? morphine (MSIR) 15 MG tablet Take 15 mg by mouth every 8 (eight) hours as needed. Back Pain     ? pantoprazole (PROTONIX) 40 MG tablet TAKE 1 TABLET BY MOUTH DAILY 30 tablet 5   ? tiZANidine (ZANAFLEX) 4 MG tablet Take 4 mg by mouth 2 (two) times daily as needed.     ? ?No current facility-administered medications for this visit.  ? ?No Known Allergies  ?Social History  ? ?Tobacco Use  ? Smoking status: Never  ? Smokeless tobacco: Never  ?Substance Use Topics  ? Alcohol use: No  ?  ?Family History  ?Problem Relation Age of Onset  ? Emphysema Maternal Grandmother   ?     never smoker  ? COPD Maternal Grandmother   ? Emphysema Maternal Uncle   ?     smoked  ? COPD Maternal Uncle   ? Arthritis Mother   ? Diabetes Mother   ? Hyperlipidemia Mother   ? Hypertension Mother   ? Early death Father 32  ?     MVA  ? Asthma Sister   ? Asthma Brother   ? Heart disease Maternal Grandfather   ?  ? ?Review of Systems  ?HENT:  Positive for tinnitus.   ?Respiratory:  Positive for shortness of breath and wheezing.    ?Musculoskeletal:  Positive for arthralgias.  ?All other systems reviewed and are negative. ? ?Objective: ? ?Physical Exam ?Constitutional:   ?   General: She is not in acute distress. ?   Appearance: Normal appearance.  ?HENT:  ?   Head: Normocephalic and atraumatic.  ?Eyes:  ?   Extraocular Movements: Extraocular movements intact.  ?   Pupils: Pupils are equal, round, and reactive to light.  ?Cardiovascular:  ?   Rate and Rhythm: Normal rate and regular rhythm.  ?   Pulses: Normal pulses.  ?   Heart sounds: Normal heart sounds.  ?Pulmonary:  ?   Effort: Pulmonary effort is normal. No  respiratory distress.  ?   Breath sounds: Normal breath sounds. No wheezing.  ?Abdominal:  ?   General: Abdomen is flat. Bowel sounds are normal. There is no distension.  ?   Palpations: Abdomen is soft.  ?   Tenderness: There is no abdominal tenderness.  ?Musculoskeletal:  ?   Cervical back: Normal range of motion and neck supple.  ?   Comments: : Examination of the left lower extremity again shows she is neurovascularly intact.  She lacks a couple of degrees of terminal extension with the left knee actively.  Good flexion.  She is stable to varus and valgus.  Joint line tenderness to the knee, more so medially.  Positive patellofemoral crepitus.    ?Lymphadenopathy:  ?   Cervical: No cervical adenopathy.  ?Skin: ?   General: Skin is warm and dry.  ?   Findings: No erythema or rash.  ?Neurological:  ?   General: No focal deficit present.  ?   Mental Status: She is alert and oriented to person, place, and time.  ?Psychiatric:     ?   Mood and Affect: Mood normal.     ?   Behavior: Behavior normal.  ? ? ?Vital signs in last 24 hours: ?@VSRANGES @ ? ?Labs: ? ? ?Estimated body mass index is 29.44 kg/m? as calculated from the following: ?  Height as of 09/03/21: 5\' 7"  (1.702 m). ?  Weight as of 09/03/21: 85.3 kg. ? ? ?Imaging Review ?Plain radiographs demonstrate moderate degenerative joint disease of the left knee(s). The overall alignment  ismild varus. The bone quality appears to be good for age and reported activity level. ? ? ? ? ? ?Assessment/Plan: ? ?End stage arthritis, left knee  ? ?The patient history, physical examination, clinical judgment of the p

## 2021-09-23 NOTE — Patient Instructions (Signed)
2 VISITORS  (aged 59 and older)  ARE ALLOWED TO COME WITH YOU AND STAY IN THE WAITING ROOM ONLY DURING PRE OP AND PROCEDURE.   ? ?**NO VISITORS ARE ALLOWED IN THE SHORT STAY AREA OR RECOVERY ROOM!!** ? ?I ?  ? ? Your procedure is scheduled on: 10/08/21 ? ? Report to [facility] Main Entrance ? ?  Report to short stay at 5:15 AM ? ? Call this number if you have problems the morning of surgery [phone] ? ? Do not eat food :After Midnight. ? ? After Midnight you may have the following liquids until _4:30__ AM DAY OF SURGERY ? ?Water ?Black Coffee (sugar ok, NO MILK/CREAM OR CREAMERS)  ?Tea (sugar ok, NO MILK/CREAM OR CREAMERS) regular and decaf                             ?Plain Jell-O (NO RED)                                           ?Fruit ices (not with fruit pulp, NO RED)                                     ?Popsicles (NO RED)                                                                  ?Juice: apple, WHITE grape, WHITE cranberry ?Sports drinks like Gatorade (NO RED) ?Clear broth(vegetable,chicken,beef) ? ? ?  ?  ?The day of surgery:  ?Drink ONE (1) Pre-Surgery Clear Ensure  at 4:15 AM the morning of surgery. Drink in one sitting. Do not sip.  ?This drink was given to you during your hospital  ?pre-op appointment visit. ?Nothing else to drink after completing the  ?Pre-Surgery Clear Ensure at 4:30 am ?  ?       If you have questions, please contact your surgeon?s office. ? ? ?  ?  ?Oral Hygiene is also important to reduce your risk of infection.                                    ?Remember - BRUSH YOUR TEETH THE MORNING OF SURGERY WITH YOUR REGULAR TOOTHPASTE ? ? Do NOT smoke after Midnight ? ? Take these medicines the morning of surgery with A SIP OF WATER: Morphine if needed, Xyzal, Flonase if needed, Pantoprazole ?Use your inhalers and bring them with you ? ? ?                  ?           You may not have any metal on your body including hair pins, jewelry, and body piercing ? ?           Do  not wear make-up, lotions, powders, perfumes/cologne, or deodorant ? ?Do not wear nail polish including gel and S&S, artificial/acrylic nails, or any other type of covering on natural  nails including finger and toenails. If you have artificial nails, gel coating, etc. that needs to be removed by a nail salon please have this removed prior to surgery or surgery may need to be canceled/ delayed if the surgeon/ anesthesia feels like they are unable to be safely monitored.  ? ?Do not shave  48 hours prior to surgery.  ? ? ? Do not bring valuables to the hospital. [facility] IS NOT ?            RESPONSIBLE   FOR VALUABLES. ? ? Contacts, dentures or bridgework may not be worn into surgery. ? ?  ? Patients discharged on the day of surgery will not be allowed to drive home.  Someone NEEDS to stay with you for the first 24 hours after anesthesia. ? ? Special Instructions: Bring a copy of your healthcare power of attorney and living will documents  the day of surgery if you haven't scanned them before. ? ?            Please read over the following fact sheets you were given: IF YOU HAVE QUESTIONS ABOUT YOUR PRE-OP INSTRUCTIONS PLEASE CALL [phone] ? ?   [facility] - Preparing for Surgery ?Before surgery, you can play an important role.  Because skin is not sterile, your skin needs to be as free of germs as possible.  You can reduce the number of germs on your skin by washing with CHG (chlorahexidine gluconate) soap before surgery.  CHG is an antiseptic cleaner which kills germs and bonds with the skin to continue killing germs even after washing. ?Please DO NOT use if you have an allergy to CHG or antibacterial soaps.  If your skin becomes reddened/irritated stop using the CHG and inform your nurse when you arrive at Short Stay. ?Do not shave (including legs and underarms) for at least 48 hours prior to the first CHG shower.  ?Please follow these instructions carefully: ? 1.  Shower with CHG Soap the night before  surgery and the  morning of Surgery. ? 2.  If you choose to wash your hair, wash your hair first as usual with your  normal  shampoo. ? 3.  After you shampoo, rinse your hair and body thoroughly to remove the  shampoo.                       ?     4.  Use CHG as you would any other liquid soap.  You can apply chg directly  to the skin and wash  ?                     Gently with a scrungie or clean washcloth. ? 5.  Apply the CHG Soap to your body ONLY FROM THE NECK DOWN.   Do not use on face/ open      ?                     Wound or open sores. Avoid contact with eyes, ears mouth and genitals (private parts).  ?                     Engineering geologist,  Genitals (private parts) with your normal soap. ?            6.  Wash thoroughly, paying special attention to the area where your surgery  will be performed. ? 7.  Thoroughly rinse your body  with warm water from the neck down. ? 8.  DO NOT shower/wash with your normal soap after using and rinsing off  the CHG Soap. ?               9.  Pat yourself dry with a clean towel. ?           10.  Wear clean pajamas. ?           11.  Place clean sheets on your bed the night of your first shower and do not  sleep with pets. ?Day of Surgery : ?Do not apply any lotions/deodorants the morning of surgery.  Please wear clean clothes to the hospital/surgery center. ? ?FAILURE TO FOLLOW THESE INSTRUCTIONS MAY RESULT IN THE CANCELLATION OF YOUR SURGERY ? ? ? ?________________________________________________________________________  ? ?Incentive Spirometer ? ?An incentive spirometer is a tool that can help keep your lungs clear and active. This tool measures how well you are filling your lungs with each breath. Taking long deep breaths may help reverse or decrease the chance of developing breathing (pulmonary) problems (especially infection) following: ?A long period of time when you are unable to move or be active. ?BEFORE THE PROCEDURE  ?If the spirometer includes an indicator to show your best  effort, your nurse or respiratory therapist will set it to a desired goal. ?If possible, sit up straight or lean slightly forward. Try not to slouch. ?Hold the incentive spirometer in an upright position. ?INSTRUCTIONS FOR USE  ?Sit on the edge of your bed if possible, or sit up as far as you can in bed or on a chair. ?Hold the incentive spirometer in an upright position. ?Breathe out normally. ?Place the mouthpiece in your mouth and seal your lips tightly around it. ?Breathe in slowly and as deeply as possible, raising the piston or the ball toward the top of the column. ?Hold your breath for 3-5 seconds or for as long as possible. Allow the piston or ball to fall to the bottom of the column. ?Remove the mouthpiece from your mouth and breathe out normally. ?Rest for a few seconds and repeat Steps 1 through 7 at least 10 times every 1-2 hours when you are awake. Take your time and take a few normal breaths between deep breaths. ?The spirometer may include an indicator to show your best effort. Use the indicator as a goal to work toward during each repetition. ?After each set of 10 deep breaths, practice coughing to be sure your lungs are clear. If you have an incision (the cut made at the time of surgery), support your incision when coughing by placing a pillow or rolled up towels firmly against it. ?Once you are able to get out of bed, walk around indoors and cough well. You may stop using the incentive spirometer when instructed by your caregiver.  ?RISKS AND COMPLICATIONS ?Take your time so you do not get dizzy or light-headed. ?If you are in pain, you may need to take or ask for pain medication before doing incentive spirometry. It is harder to take a deep breath if you are having pain. ?AFTER USE ?Rest and breathe slowly and easily. ?It can be helpful to keep track of a log of your progress. Your caregiver can provide you with a simple table to help with this. ?If you are using the spirometer at home, follow  these instructions: ?SEEK MEDICAL CARE IF:  ?You are having difficultly using the spirometer. ?You have trouble using the spirometer as  often as instructed. ?Your pain medication is not giving enough relief whil

## 2021-09-27 ENCOUNTER — Other Ambulatory Visit: Payer: Self-pay

## 2021-09-27 ENCOUNTER — Encounter (HOSPITAL_COMMUNITY): Payer: Self-pay

## 2021-09-27 ENCOUNTER — Encounter (HOSPITAL_COMMUNITY)
Admission: RE | Admit: 2021-09-27 | Discharge: 2021-09-27 | Disposition: A | Discharge status: Home / Self Care | Payer: Medicare HMO | Source: Ambulatory Visit | Attending: Orthopedic Surgery | Admitting: Orthopedic Surgery

## 2021-09-27 VITALS — BP 145/68 | HR 58 | Temp 98.3°F | Resp 16 | Ht 67.0 in | Wt 187.0 lb

## 2021-09-27 DIAGNOSIS — M25561 Pain in right knee: Secondary | ICD-10-CM | POA: Diagnosis not present

## 2021-09-27 DIAGNOSIS — Z01812 Encounter for preprocedural laboratory examination: Secondary | ICD-10-CM | POA: Insufficient documentation

## 2021-09-27 DIAGNOSIS — Z01818 Encounter for other preprocedural examination: Secondary | ICD-10-CM

## 2021-09-27 DIAGNOSIS — G8929 Other chronic pain: Secondary | ICD-10-CM | POA: Diagnosis not present

## 2021-09-27 LAB — TYPE AND SCREEN
ABO/RH(D): B POS
Antibody Screen: NEGATIVE

## 2021-09-27 LAB — CBC WITH DIFFERENTIAL/PLATELET
Abs Immature Granulocytes: 0.01 10*3/uL (ref 0.00–0.07)
Basophils Absolute: 0 10*3/uL (ref 0.0–0.1)
Basophils Relative: 0 %
Eosinophils Absolute: 0 10*3/uL (ref 0.0–0.5)
Eosinophils Relative: 0 %
HCT: 41 % (ref 36.0–46.0)
Hemoglobin: 13.4 g/dL (ref 12.0–15.0)
Immature Granulocytes: 0 %
Lymphocytes Relative: 44 %
Lymphs Abs: 2.4 10*3/uL (ref 0.7–4.0)
MCH: 29.8 pg (ref 26.0–34.0)
MCHC: 32.7 g/dL (ref 30.0–36.0)
MCV: 91.3 fL (ref 80.0–100.0)
Monocytes Absolute: 0.5 10*3/uL (ref 0.1–1.0)
Monocytes Relative: 9 %
Neutro Abs: 2.6 10*3/uL (ref 1.7–7.7)
Neutrophils Relative %: 47 %
Platelets: 237 10*3/uL (ref 150–400)
RBC: 4.49 MIL/uL (ref 3.87–5.11)
RDW: 13.1 % (ref 11.5–15.5)
WBC: 5.6 10*3/uL (ref 4.0–10.5)
nRBC: 0 % (ref 0.0–0.2)

## 2021-09-27 LAB — SURGICAL PCR SCREEN
MRSA, PCR: NEGATIVE
Staphylococcus aureus: NEGATIVE

## 2021-09-27 LAB — COMPREHENSIVE METABOLIC PANEL
ALT: 29 U/L (ref 0–44)
AST: 26 U/L (ref 15–41)
Albumin: 3.9 g/dL (ref 3.5–5.0)
Alkaline Phosphatase: 93 U/L (ref 38–126)
Anion gap: 8 (ref 5–15)
BUN: 17 mg/dL (ref 6–20)
CO2: 27 mmol/L (ref 22–32)
Calcium: 9.3 mg/dL (ref 8.9–10.3)
Chloride: 105 mmol/L (ref 98–111)
Creatinine, Ser: 0.72 mg/dL (ref 0.44–1.00)
GFR, Estimated: >60 mL/min (ref >60)
Glucose, Bld: 79 mg/dL (ref 70–99)
Potassium: 3.7 mmol/L (ref 3.5–5.1)
Sodium: 140 mmol/L (ref 135–145)
Total Bilirubin: 0.5 mg/dL (ref 0.3–1.2)
Total Protein: 6.8 g/dL (ref 6.5–8.1)

## 2021-09-27 NOTE — Progress Notes (Signed)
Anesthesia note: ? ?Bowel prep reminder:no ? ?PCP - Dr. Harlene Ramus ?Cardiologist -no ?Other-  ? ?Chest x-ray - no ?EKG - 08/23/21-epic ?Stress Test - 2021 ?ECHO - no ?Cardiac Cath - NA ? ?Pacemaker/ICD device last checked:NA ? ?Sleep Study - no ?CPAP -  ? ?Pt is pre diabetic-NA ?Fasting Blood Sugar -  ?Checks Blood Sugar _____ ? ?Blood Thinner:NA ?Blood Thinner Instructions: ?Aspirin Instructions: ?Last Dose: ? ?Anesthesia review: no ? ?Patient denies shortness of breath, fever, cough and chest pain at PAT appointment ?Chronic back pain takes MSIR. No SOB with activities.uses inhalers daily but Asthma has improved. ? ?Patient verbalized understanding of instructions that were given to them at the PAT appointment. Patient was also instructed that they will need to review over the PAT instructions again at home before surgery. yes ?

## 2021-09-28 NOTE — Care Plan (Signed)
Ortho Bundle Case Management Note ? ?Patient Details  ?Name: [name] ?MRN: [id number] ?Date of Birth: 09/27/1960 ? ?Met with patient in the office prior to surgery. She will discharge to home with family to assist. Rolling walker and CPM ordered from [facility]. HHPT referral to [place] and [place] set up with [facility].  ?Patient and MD in agreement with plan. Choice offered                 ? ? ? ?DME Arranged:  Walker rolling, CPM ?DME Agency:  [facility] ? ?HH Arranged:  PT ?[place] Agency:  [facility] ? ?Additional Comments: ?Please contact me with any questions of if this plan should need to change. ? ?Mardelle Matte  [facility] Orthopaedic Specialist  [phone] ?09/28/2021, 9:40 AM ?  ?

## 2021-09-29 ENCOUNTER — Ambulatory Visit (INDEPENDENT_AMBULATORY_CARE_PROVIDER_SITE_OTHER): Payer: Medicare HMO

## 2021-09-29 DIAGNOSIS — J4550 Severe persistent asthma, uncomplicated: Secondary | ICD-10-CM

## 2021-09-29 MED ORDER — BENRALIZUMAB 30 MG/ML ~~LOC~~ SOSY
30.0000 mg | PREFILLED_SYRINGE | SUBCUTANEOUS | Status: DC
Start: 2021-09-29 — End: 2022-01-24
  Administered 2021-09-29 – 2021-11-24 (×2): 30 mg via SUBCUTANEOUS

## 2021-09-30 DIAGNOSIS — Z79899 Other long term (current) drug therapy: Secondary | ICD-10-CM | POA: Diagnosis not present

## 2021-09-30 DIAGNOSIS — G894 Chronic pain syndrome: Secondary | ICD-10-CM | POA: Diagnosis not present

## 2021-09-30 DIAGNOSIS — M4326 Fusion of spine, lumbar region: Secondary | ICD-10-CM | POA: Diagnosis not present

## 2021-09-30 DIAGNOSIS — M47892 Other spondylosis, cervical region: Secondary | ICD-10-CM | POA: Diagnosis not present

## 2021-09-30 DIAGNOSIS — M5106 Intervertebral disc disorders with myelopathy, lumbar region: Secondary | ICD-10-CM | POA: Diagnosis not present

## 2021-09-30 DIAGNOSIS — Z79891 Long term (current) use of opiate analgesic: Secondary | ICD-10-CM | POA: Diagnosis not present

## 2021-10-05 ENCOUNTER — Encounter: Payer: Self-pay | Admitting: Allergy & Immunology

## 2021-10-07 ENCOUNTER — Ambulatory Visit (INDEPENDENT_AMBULATORY_CARE_PROVIDER_SITE_OTHER): Payer: Medicare HMO

## 2021-10-07 VITALS — Wt 187.0 lb

## 2021-10-07 DIAGNOSIS — Z0000 Encounter for general adult medical examination without abnormal findings: Secondary | ICD-10-CM

## 2021-10-07 MED ORDER — TRANEXAMIC ACID 1000 MG/10ML IV SOLN
2000.0000 mg | INTRAVENOUS | Status: AC
  Administered 2021-10-08: 2000 mg via TOPICAL
  Filled 2021-10-07 (×2): qty 20

## 2021-10-07 NOTE — Patient Instructions (Signed)
Ms. Riesen , ?Thank you for taking time to come for your Medicare Wellness Visit. I appreciate your ongoing commitment to your health goals. Please review the following plan we discussed and let me know if I can assist you in the future.  ? ?Screening recommendations/referrals: ?Colonoscopy: Done 05/19/2021 - Repeat in 10 years ?Mammogram: Done 10/22/2020 - Repeat annually *schedule this after you recover from knee surgery ?Bone Density: Due at age 61 ?Recommended yearly ophthalmology/optometry visit for glaucoma screening and checkup ?Recommended yearly dental visit for hygiene and checkup ? ?Vaccinations: ?Influenza vaccine: Recommended every fall ?Pneumococcal vaccine: NTIRWER-15 Done 11/29/2017 - ask about Pneumovax-23 ?Tdap vaccine: Done 06/29/2011 - Repeat in 10 years ?Shingles vaccine: Due - Shingrix is 2 doses 2-6 months apart and over 90% effective    ?Covid-19: Declined ? ?Advanced directives: Advance directive discussed with you today. Even though you declined this today, please call our office should you change your mind, and we can give you the proper paperwork for you to fill out.  ? ?Conditions/risks identified: Aim for 30 minutes of exercise or brisk walking, 6-8 glasses of water, and 5 servings of fruits and vegetables each day. ? ?Next appointment: Follow up in one year for your annual wellness visit.  ? ?Preventive Care 40-64 Years, Female ?Preventive care refers to lifestyle choices and visits with your health care provider that can promote health and wellness. ?What does preventive care include? ?A yearly physical exam. This is also called an annual well check. ?Dental exams once or twice a year. ?Routine eye exams. Ask your health care provider how often you should have your eyes checked. ?Personal lifestyle choices, including: ?Daily care of your teeth and gums. ?Regular physical activity. ?Eating a healthy diet. ?Avoiding tobacco and drug use. ?Limiting alcohol use. ?Practicing safe sex. ?Taking  low-dose aspirin daily starting at age 78. ?Taking vitamin and mineral supplements as recommended by your health care provider. ?What happens during an annual well check? ?The services and screenings done by your health care provider during your annual well check will depend on your age, overall health, lifestyle risk factors, and family history of disease. ?Counseling  ?Your health care provider may ask you questions about your: ?Alcohol use. ?Tobacco use. ?Drug use. ?Emotional well-being. ?Home and relationship well-being. ?Sexual activity. ?Eating habits. ?Work and work Statistician. ?Method of birth control. ?Menstrual cycle. ?Pregnancy history. ?Screening  ?You may have the following tests or measurements: ?Height, weight, and BMI. ?Blood pressure. ?Lipid and cholesterol levels. These may be checked every 5 years, or more frequently if you are over 72 years old. ?Skin check. ?Lung cancer screening. You may have this screening every year starting at age 57 if you have a 30-pack-year history of smoking and currently smoke or have quit within the past 15 years. ?Fecal occult blood test (FOBT) of the stool. You may have this test every year starting at age 20. ?Flexible sigmoidoscopy or colonoscopy. You may have a sigmoidoscopy every 5 years or a colonoscopy every 10 years starting at age 29. ?Hepatitis C blood test. ?Hepatitis B blood test. ?Sexually transmitted disease (STD) testing. ?Diabetes screening. This is done by checking your blood sugar (glucose) after you have not eaten for a while (fasting). You may have this done every 1-3 years. ?Mammogram. This may be done every 1-2 years. Talk to your health care provider about when you should start having regular mammograms. This may depend on whether you have a family history of breast cancer. ?BRCA-related cancer screening. This may  be done if you have a family history of breast, ovarian, tubal, or peritoneal cancers. ?Pelvic exam and Pap test. This may be done  every 3 years starting at age 5. Starting at age 5, this may be done every 5 years if you have a Pap test in combination with an HPV test. ?Bone density scan. This is done to screen for osteoporosis. You may have this scan if you are at high risk for osteoporosis. ?Discuss your test results, treatment options, and if necessary, the need for more tests with your health care provider. ?Vaccines  ?Your health care provider may recommend certain vaccines, such as: ?Influenza vaccine. This is recommended every year. ?Tetanus, diphtheria, and acellular pertussis (Tdap, Td) vaccine. You may need a Td booster every 10 years. ?Zoster vaccine. You may need this after age 59. ?Pneumococcal 13-valent conjugate (PCV13) vaccine. You may need this if you have certain conditions and were not previously vaccinated. ?Pneumococcal polysaccharide (PPSV23) vaccine. You may need one or two doses if you smoke cigarettes or if you have certain conditions. ?Talk to your health care provider about which screenings and vaccines you need and how often you need them. ?This information is not intended to replace advice given to you by your health care provider. Make sure you discuss any questions you have with your health care provider. ?Document Released: 07/10/2015 Document Revised: 03/02/2016 Document Reviewed: 04/14/2015 ?Elsevier Interactive Patient Education ? 2017 [employer] ? ? ? ?Fall Prevention in the Home ?Falls can cause injuries. They can happen to people of all ages. There are many things you can do to make your home safe and to help prevent falls. ?What can I do on the outside of my home? ?Regularly fix the edges of walkways and driveways and fix any cracks. ?Remove anything that might make you trip as you walk through a door, such as a raised step or threshold. ?Trim any bushes or trees on the path to your home. ?Use bright outdoor lighting. ?Clear any walking paths of anything that might make someone trip, such as rocks or  tools. ?Regularly check to see if handrails are loose or broken. Make sure that both sides of any steps have handrails. ?Any raised decks and porches should have guardrails on the edges. ?Have any leaves, snow, or ice cleared regularly. ?Use sand or salt on walking paths during winter. ?Clean up any spills in your garage right away. This includes oil or grease spills. ?What can I do in the bathroom? ?Use night lights. ?Install grab bars by the toilet and in the tub and shower. Do not use towel bars as grab bars. ?Use non-skid mats or decals in the tub or shower. ?If you need to sit down in the shower, use a plastic, non-slip stool. ?Keep the floor dry. Clean up any water that spills on the floor as soon as it happens. ?Remove soap buildup in the tub or shower regularly. ?Attach bath mats securely with double-sided non-slip rug tape. ?Do not have throw rugs and other things on the floor that can make you trip. ?What can I do in the bedroom? ?Use night lights. ?Make sure that you have a light by your bed that is easy to reach. ?Do not use any sheets or blankets that are too big for your bed. They should not hang down onto the floor. ?Have a firm chair that has side arms. You can use this for support while you get dressed. ?Do not have throw rugs and other things  on the floor that can make you trip. ?What can I do in the kitchen? ?Clean up any spills right away. ?Avoid walking on wet floors. ?Keep items that you use a lot in easy-to-reach places. ?If you need to reach something above you, use a strong step stool that has a grab bar. ?Keep electrical cords out of the way. ?Do not use floor polish or wax that makes floors slippery. If you must use wax, use non-skid floor wax. ?Do not have throw rugs and other things on the floor that can make you trip. ?What can I do with my stairs? ?Do not leave any items on the stairs. ?Make sure that there are handrails on both sides of the stairs and use them. Fix handrails that are  broken or loose. Make sure that handrails are as long as the stairways. ?Check any carpeting to make sure that it is firmly attached to the stairs. Fix any carpet that is loose or worn. ?Avoid having thro

## 2021-10-07 NOTE — Progress Notes (Signed)
? ?Subjective:  ? [name] is a 61 y.o. female who presents for Medicare Annual (Subsequent) preventive examination. ? ?Virtual Visit via Telephone Note ? ?I connected with  [name] on 10/07/21 at  2:00 PM EDT by telephone and verified that I am speaking with the correct person using two identifiers. ? ?Location: ?Patient: Home ?Provider: [facility] ?Persons participating in the virtual visit: patient/Nurse Health Advisor ?  ?I discussed the limitations, risks, security and privacy concerns of performing an evaluation and management service by telephone and the availability of in person appointments. The patient expressed understanding and agreed to proceed. ? ?Interactive audio and video telecommunications were attempted between this nurse and patient, however failed, due to patient having technical difficulties OR patient did not have access to video capability.  We continued and completed visit with audio only. ? ?Some vital signs may be absent or patient reported.  ? ?Michelle Runnion Dionne Ano, LPN  ? ?Review of Systems    ? ?Cardiac Risk Factors include: advanced age (>17men, >41 women);sedentary lifestyle ? ?   ?Objective:  ?  ?Today's Vitals  ? 10/07/21 1402  ?Weight: 187 lb (84.8 kg)  ?PainSc: 8   ? ?Body mass index is 29.29 kg/m?. ? ? ?  10/07/2021  ?  2:13 PM 09/27/2021  ? 11:36 AM 05/23/2021  ?  5:06 AM 05/17/2021  ? 12:34 PM 10/06/2020  ? 12:55 PM 10/04/2019  ? 11:18 AM 03/13/2017  ?  1:51 PM  ?Advanced Directives  ?Does Patient Have a Medical Advance Directive? No No No No No No No  ?Would patient like information on creating a medical advance directive? No - Patient declined No - Patient declined No - Patient declined  No - Patient declined No - Patient declined No - Patient declined  ? ? ?Current Medications (verified) ?Outpatient Encounter Medications as of 10/07/2021  ?Medication Sig  ? albuterol (VENTOLIN HFA) 108 (90 Base) MCG/ACT inhaler Inhale 2 puffs into the lungs every 4 (four) hours as needed for wheezing  or shortness of breath.  ? azelastine (ASTELIN) 0.1 % nasal spray Place 2 sprays into both nostrils 2 (two) times daily. Use in each nostril as directed  ? Benralizumab (FASENRA) 30 MG/ML SOSY Inject 1 mL (30 mg total) into the skin every 8 (eight) weeks.  ? budesonide-formoterol (SYMBICORT) 160-4.5 MCG/ACT inhaler Inhale 2 puffs into the lungs 2 (two) times daily. (Patient taking differently: Inhale 1 puff into the lungs 2 (two) times daily.)  ? diclofenac (VOLTAREN) 75 MG EC tablet Take 1 tablet (75 mg total) by mouth 2 (two) times daily with a meal.  ? EPINEPHrine 0.3 mg/0.3 mL IJ SOAJ injection Inject 0.3 mg into the muscle as needed for anaphylaxis.  ? Flaxseed, Linseed, (FLAXSEED OIL) 1000 MG CAPS Take 1,000 mg by mouth daily. With Omega 3-6-9  ? fluticasone (FLONASE) 50 MCG/ACT nasal spray Place 2 sprays into both nostrils in the morning and at bedtime.  ? hydrOXYzine (ATARAX/VISTARIL) 50 MG tablet TAKE 1/2 TO 1 TABLET BY MOUTH EVERY 8 HOURS AS NEEDED FOR ANXIETY  ? ibuprofen (ADVIL) 800 MG tablet Take 800 mg by mouth every 8 (eight) hours as needed for moderate pain.  ? levocetirizine (XYZAL) 5 MG tablet Take 1 tablet (5 mg total) by mouth 2 (two) times daily as needed for allergies (Can take an extra dose during flare ups.). (Patient taking differently: Take 5 mg by mouth daily.)  ? lidocaine (XYLOCAINE) 5 % ointment Apply 1 application topically as  needed.  ? montelukast (SINGULAIR) 10 MG tablet Take 1 tablet (10 mg total) by mouth at bedtime.  ? morphine (MSIR) 15 MG tablet Take 15 mg by mouth every 8 (eight) hours as needed. Back Pain  ? Multiple Vitamins-Minerals (MULTIVITAMIN WITH MINERALS) tablet Take 1 tablet by mouth daily.  ? pantoprazole (PROTONIX) 40 MG tablet TAKE 1 TABLET BY MOUTH DAILY  ? Propylene Glycol (SYSTANE BALANCE) 0.6 % SOLN Place 1 drop into both eyes as needed (dry eyes).  ? tiZANidine (ZANAFLEX) 4 MG tablet Take 4 mg by mouth 2 (two) times daily as needed for muscle spasms.  ?  Turmeric 500 MG CAPS Take 500 mg by mouth 2 (two) times daily as needed (pain).  ? ipratropium-albuterol (DUONEB) 0.5-2.5 (3) MG/3ML SOLN Take 3 mLs by nebulization every 4 (four) hours as needed.  ? ?Facility-Administered Encounter Medications as of 10/07/2021  ?Medication  ? Benralizumab SOSY 30 mg  ? [START ON 10/08/2021] tranexamic acid (CYKLOKAPRON) 2,000 mg in sodium chloride 0.9 % 50 mL Topical Application  ? ? ?Allergies (verified) ?Patient has no known allergies.  ? ?History: ?Past Medical History:  ?Diagnosis Date  ? Arthritis   ? Spine, R knee  ? Asthma   ? Back pain   ? Rod in back  ? GERD (gastroesophageal reflux disease)   ? History of blood transfusion   ? Back surgery  ? hx Cough 09/03/2014  ? Followed in Pulmonary clinic/ [facility]/ [facility] - sinus CT 09/08/2014> Mild mucosal thickening involves the paranasal sinuses  - Eos 1.2  08/06/14 - Allergy profile 11/27/14 >  Eos 1.0/ IgE  41 with neg RAST    ? ?Past Surgical History:  ?Procedure Laterality Date  ? ABDOMINAL HYSTERECTOMY  2004  ? "cysts", pelvic pain  ? BACK SURGERY  2010  ? rod  ? BACK SURGERY  2014  ? broken hardware removal  ? BIOPSY  05/19/2021  ? Procedure: BIOPSY;  Surgeon: Rogene Houston, MD;  Location: [facility];  Service: Endoscopy;;  ? COLONOSCOPY WITH PROPOFOL N/A 05/19/2021  ? Procedure: COLONOSCOPY WITH PROPOFOL;  Surgeon: Rogene Houston, MD;  Location: [facility];  Service: Endoscopy;  Laterality: N/A;  10:10  ? ESOPHAGEAL DILATION N/A 05/19/2021  ? Procedure: ESOPHAGEAL DILATION;  Surgeon: Rogene Houston, MD;  Location: [facility];  Service: Endoscopy;  Laterality: N/A;  ? ESOPHAGOGASTRODUODENOSCOPY (EGD) WITH PROPOFOL N/A 05/19/2021  ? Procedure: ESOPHAGOGASTRODUODENOSCOPY (EGD) WITH PROPOFOL;  Surgeon: Rogene Houston, MD;  Location: [facility];  Service: Endoscopy;  Laterality: N/A;  ? KNEE ARTHROSCOPY Right 2017  ? KNEE ARTHROSCOPY Left 2022  ? ?Family History  ?Problem Relation Age of Onset  ?  Emphysema Maternal Grandmother   ?     never smoker  ? COPD Maternal Grandmother   ? Emphysema Maternal Uncle   ?     smoked  ? COPD Maternal Uncle   ? Arthritis Mother   ? Diabetes Mother   ? Hyperlipidemia Mother   ? Hypertension Mother   ? Early death Father 88  ?     MVA  ? Asthma Sister   ? Asthma Brother   ? Heart disease Maternal Grandfather   ? ?Social History  ? ?Socioeconomic History  ? Marital status: Significant Other  ?  Spouse name: [name]  ? Number of children: 2  ? Years of education: 19  ? Highest education level: Not on file  ?Occupational History  ? Occupation: disabled  ?  Comment: back  ?Tobacco Use  ? Smoking status: Never  ? Smokeless tobacco: Never  ?Vaping Use  ? Vaping Use: Never used  ?Substance and Sexual Activity  ? Alcohol use: No  ? Drug use: No  ? Sexual activity: Yes  ?  Birth control/protection: Surgical  ?Other Topics Concern  ? Not on file  ?Social History Narrative  ? Lives with [name] - 47 years  ? [name] has end stage colon cancer  ? Two sons/ grandchildren  ? Disabled from back pain/had scoliosis. Has had back reconstruction.   ? Dr. Hulda Marin doctor.   ? ?Social Determinants of Health  ? ?Financial Resource Strain: Low Risk   ? Difficulty of Paying Living Expenses: Not hard at all  ?Food Insecurity: No Food Insecurity  ? Worried About Charity fundraiser in the Last Year: Never true  ? Ran Out of Food in the Last Year: Never true  ?Transportation Needs: No Transportation Needs  ? Lack of Transportation (Medical): No  ? Lack of Transportation (Non-Medical): No  ?Physical Activity: Sufficiently Active  ? Days of Exercise per Week: 3 days  ? Minutes of Exercise per Session: 60 min  ?Stress: No Stress Concern Present  ? Feeling of Stress : Only a little  ?Social Connections: Socially Integrated  ? Frequency of Communication with Friends and Family: More than three times a week  ? Frequency of Social Gatherings with Friends and Family: More than three times a week  ? Attends  Religious Services: More than 4 times per year  ? Active Member of Clubs or Organizations: Yes  ? Attends Archivist Meetings: More than 4 times per year  ? Marital Status: Living with partner  ? ? ?Tobacco Cou

## 2021-10-08 ENCOUNTER — Encounter (HOSPITAL_COMMUNITY): Payer: Self-pay | Admitting: Orthopedic Surgery

## 2021-10-08 ENCOUNTER — Ambulatory Visit (HOSPITAL_COMMUNITY): Payer: Medicare HMO | Admitting: Certified Registered Nurse Anesthetist

## 2021-10-08 ENCOUNTER — Ambulatory Visit (HOSPITAL_COMMUNITY)
Admission: RE | Admit: 2021-10-08 | Discharge: 2021-10-08 | Disposition: A | Discharge status: Home / Self Care | Payer: Medicare HMO | Source: Ambulatory Visit | Attending: Orthopedic Surgery | Admitting: Orthopedic Surgery

## 2021-10-08 ENCOUNTER — Encounter (HOSPITAL_COMMUNITY)
Admission: RE | Disposition: A | Discharge status: Home / Self Care | Payer: Self-pay | Source: Ambulatory Visit | Attending: Orthopedic Surgery

## 2021-10-08 ENCOUNTER — Ambulatory Visit (HOSPITAL_BASED_OUTPATIENT_CLINIC_OR_DEPARTMENT_OTHER): Payer: Medicare HMO | Admitting: Certified Registered Nurse Anesthetist

## 2021-10-08 DIAGNOSIS — M1712 Unilateral primary osteoarthritis, left knee: Secondary | ICD-10-CM

## 2021-10-08 DIAGNOSIS — M25762 Osteophyte, left knee: Secondary | ICD-10-CM | POA: Diagnosis not present

## 2021-10-08 DIAGNOSIS — J4540 Moderate persistent asthma, uncomplicated: Secondary | ICD-10-CM | POA: Diagnosis not present

## 2021-10-08 DIAGNOSIS — G8918 Other acute postprocedural pain: Secondary | ICD-10-CM | POA: Diagnosis not present

## 2021-10-08 DIAGNOSIS — K219 Gastro-esophageal reflux disease without esophagitis: Secondary | ICD-10-CM | POA: Diagnosis not present

## 2021-10-08 HISTORY — PX: TOTAL KNEE ARTHROPLASTY: SHX125

## 2021-10-08 LAB — ABO/RH: ABO/RH(D): B POS

## 2021-10-08 SURGERY — ARTHROPLASTY, KNEE, TOTAL
Anesthesia: Spinal | Site: Knee | Laterality: Left

## 2021-10-08 MED ORDER — OXYCODONE HCL 5 MG PO TABS: 5.0000 mg | ORAL_TABLET | Freq: Once | ORAL | Status: DC | PRN

## 2021-10-08 MED ORDER — LACTATED RINGERS IV BOLUS
500.0000 mL | Freq: Once | INTRAVENOUS | Status: AC
  Administered 2021-10-08: 500 mL via INTRAVENOUS

## 2021-10-08 MED ORDER — POVIDONE-IODINE 10 % EX SWAB
2.0000 "application " | Freq: Once | CUTANEOUS | Status: AC
  Administered 2021-10-08: 2 via TOPICAL

## 2021-10-08 MED ORDER — ACETAMINOPHEN 10 MG/ML IV SOLN
INTRAVENOUS | Status: AC
  Filled 2021-10-08: qty 100

## 2021-10-08 MED ORDER — HYDROMORPHONE HCL 1 MG/ML IJ SOLN
0.2500 mg | INTRAMUSCULAR | Status: DC | PRN
  Administered 2021-10-08 (×4): 0.5 mg via INTRAVENOUS

## 2021-10-08 MED ORDER — CHLORHEXIDINE GLUCONATE 0.12 % MT SOLN
15.0000 mL | Freq: Once | OROMUCOSAL | Status: AC
  Administered 2021-10-08: 15 mL via OROMUCOSAL

## 2021-10-08 MED ORDER — DEXAMETHASONE SODIUM PHOSPHATE 10 MG/ML IJ SOLN
INTRAMUSCULAR | Status: DC | PRN
  Administered 2021-10-08: 10 mg via INTRAVENOUS

## 2021-10-08 MED ORDER — BUPIVACAINE LIPOSOME 1.3 % IJ SUSP: 20.0000 mL | Freq: Once | INTRAMUSCULAR | Status: DC

## 2021-10-08 MED ORDER — OXYCODONE HCL 5 MG PO TABS: ORAL_TABLET | ORAL | 0 refills | Status: DC

## 2021-10-08 MED ORDER — MIDAZOLAM HCL 5 MG/5ML IJ SOLN
INTRAMUSCULAR | Status: DC | PRN
  Administered 2021-10-08 (×2): 1 mg via INTRAVENOUS

## 2021-10-08 MED ORDER — LIDOCAINE 2% (20 MG/ML) 5 ML SYRINGE
INTRAMUSCULAR | Status: DC | PRN
  Administered 2021-10-08: 80 mg via INTRAVENOUS

## 2021-10-08 MED ORDER — KETOROLAC TROMETHAMINE 30 MG/ML IJ SOLN
30.0000 mg | Freq: Once | INTRAMUSCULAR | Status: AC
  Administered 2021-10-08: 30 mg via INTRAVENOUS

## 2021-10-08 MED ORDER — TRANEXAMIC ACID-NACL 1000-0.7 MG/100ML-% IV SOLN
1000.0000 mg | INTRAVENOUS | Status: AC
  Administered 2021-10-08: 1000 mg via INTRAVENOUS
  Filled 2021-10-08: qty 100

## 2021-10-08 MED ORDER — LORAZEPAM 2 MG/ML IJ SOLN
INTRAMUSCULAR | Status: AC
  Filled 2021-10-08: qty 1

## 2021-10-08 MED ORDER — HYDROMORPHONE HCL 1 MG/ML IJ SOLN
INTRAMUSCULAR | Status: DC | PRN
  Administered 2021-10-08 (×4): .5 mg via INTRAVENOUS

## 2021-10-08 MED ORDER — CEFAZOLIN SODIUM-DEXTROSE 2-4 GM/100ML-% IV SOLN
2.0000 g | INTRAVENOUS | Status: AC
  Administered 2021-10-08: 2 g via INTRAVENOUS
  Filled 2021-10-08: qty 100

## 2021-10-08 MED ORDER — TRANEXAMIC ACID-NACL 1000-0.7 MG/100ML-% IV SOLN: 1000.0000 mg | Freq: Once | INTRAVENOUS | Status: DC

## 2021-10-08 MED ORDER — ONDANSETRON HCL 4 MG/2ML IJ SOLN
INTRAMUSCULAR | Status: AC
  Filled 2021-10-08: qty 2

## 2021-10-08 MED ORDER — ONDANSETRON HCL 4 MG/2ML IJ SOLN: 4.0000 mg | Freq: Once | INTRAMUSCULAR | Status: DC | PRN

## 2021-10-08 MED ORDER — FENTANYL CITRATE PF 50 MCG/ML IJ SOSY
PREFILLED_SYRINGE | INTRAMUSCULAR | Status: AC
  Filled 2021-10-08: qty 1

## 2021-10-08 MED ORDER — LORAZEPAM 2 MG/ML IJ SOLN
1.0000 mg | Freq: Once | INTRAMUSCULAR | Status: AC
  Administered 2021-10-08: 1 mg via INTRAVENOUS

## 2021-10-08 MED ORDER — MIDAZOLAM HCL 2 MG/2ML IJ SOLN
INTRAMUSCULAR | Status: AC
  Filled 2021-10-08: qty 2

## 2021-10-08 MED ORDER — SODIUM CHLORIDE (PF) 0.9 % IJ SOLN
INTRAMUSCULAR | Status: AC
  Filled 2021-10-08: qty 50

## 2021-10-08 MED ORDER — FENTANYL CITRATE (PF) 250 MCG/5ML IJ SOLN
INTRAMUSCULAR | Status: DC | PRN
  Administered 2021-10-08 (×5): 50 ug via INTRAVENOUS

## 2021-10-08 MED ORDER — HYDROMORPHONE HCL 1 MG/ML IJ SOLN
INTRAMUSCULAR | Status: AC
  Filled 2021-10-08: qty 2

## 2021-10-08 MED ORDER — CELECOXIB 200 MG PO CAPS: 200.0000 mg | ORAL_CAPSULE | Freq: Two times a day (BID) | ORAL | 2 refills | Status: DC

## 2021-10-08 MED ORDER — SODIUM CHLORIDE 0.9 % IR SOLN
Status: DC | PRN
Start: 2021-10-08 — End: 2021-10-08
  Administered 2021-10-08 (×2): 1000 mL

## 2021-10-08 MED ORDER — FENTANYL CITRATE (PF) 250 MCG/5ML IJ SOLN
INTRAMUSCULAR | Status: AC
  Filled 2021-10-08: qty 5

## 2021-10-08 MED ORDER — FENTANYL CITRATE PF 50 MCG/ML IJ SOSY
PREFILLED_SYRINGE | INTRAMUSCULAR | Status: AC
  Filled 2021-10-08: qty 4

## 2021-10-08 MED ORDER — PROPOFOL 10 MG/ML IV BOLUS
INTRAVENOUS | Status: DC | PRN
  Administered 2021-10-08: 150 mg via INTRAVENOUS

## 2021-10-08 MED ORDER — WATER FOR IRRIGATION, STERILE IR SOLN
Status: DC | PRN
Start: 2021-10-08 — End: 2021-10-08
  Administered 2021-10-08: 2000 mL

## 2021-10-08 MED ORDER — KETOROLAC TROMETHAMINE 30 MG/ML IJ SOLN
INTRAMUSCULAR | Status: AC
  Filled 2021-10-08: qty 1

## 2021-10-08 MED ORDER — ACETAMINOPHEN 500 MG PO TABS: 1000.0000 mg | ORAL_TABLET | Freq: Once | ORAL | Status: DC

## 2021-10-08 MED ORDER — AMISULPRIDE (ANTIEMETIC) 5 MG/2ML IV SOLN: 10.0000 mg | Freq: Once | INTRAVENOUS | Status: DC | PRN

## 2021-10-08 MED ORDER — SODIUM CHLORIDE 0.9 % IV SOLN
INTRAVENOUS | Status: DC | PRN
  Administered 2021-10-08: 100 mL

## 2021-10-08 MED ORDER — BUPIVACAINE LIPOSOME 1.3 % IJ SUSP
INTRAMUSCULAR | Status: AC
  Filled 2021-10-08: qty 20

## 2021-10-08 MED ORDER — BUPIVACAINE HCL (PF) 0.5 % IJ SOLN
INTRAMUSCULAR | Status: DC | PRN
  Administered 2021-10-08: 20 mL via PERINEURAL

## 2021-10-08 MED ORDER — SODIUM CHLORIDE 0.9 % IV SOLN: INTRAVENOUS | Status: DC

## 2021-10-08 MED ORDER — ACETAMINOPHEN 500 MG PO TABS
1000.0000 mg | ORAL_TABLET | Freq: Once | ORAL | Status: AC
  Administered 2021-10-08: 1000 mg via ORAL
  Filled 2021-10-08: qty 2

## 2021-10-08 MED ORDER — PROPOFOL 10 MG/ML IV BOLUS
INTRAVENOUS | Status: AC
  Filled 2021-10-08: qty 20

## 2021-10-08 MED ORDER — OXYCODONE HCL 5 MG/5ML PO SOLN: 5.0000 mg | Freq: Once | ORAL | Status: DC | PRN

## 2021-10-08 MED ORDER — HYDROMORPHONE HCL 2 MG/ML IJ SOLN
INTRAMUSCULAR | Status: AC
  Filled 2021-10-08: qty 1

## 2021-10-08 MED ORDER — LACTATED RINGERS IV SOLN
INTRAVENOUS | Status: DC
Start: 2021-10-08 — End: 2021-10-08

## 2021-10-08 MED ORDER — PHENYLEPHRINE HCL-NACL 20-0.9 MG/250ML-% IV SOLN
INTRAVENOUS | Status: DC | PRN
  Administered 2021-10-08: 35 ug/min via INTRAVENOUS

## 2021-10-08 MED ORDER — LIDOCAINE HCL (PF) 2 % IJ SOLN
INTRAMUSCULAR | Status: AC
  Filled 2021-10-08: qty 5

## 2021-10-08 MED ORDER — ACETAMINOPHEN 10 MG/ML IV SOLN
1000.0000 mg | Freq: Once | INTRAVENOUS | Status: DC
Start: 2021-10-08 — End: 2021-10-08

## 2021-10-08 MED ORDER — ORAL CARE MOUTH RINSE: 15.0000 mL | Freq: Once | OROMUCOSAL | Status: AC

## 2021-10-08 MED ORDER — BUPIVACAINE-EPINEPHRINE (PF) 0.25% -1:200000 IJ SOLN
INTRAMUSCULAR | Status: AC
  Filled 2021-10-08: qty 30

## 2021-10-08 MED ORDER — FENTANYL CITRATE PF 50 MCG/ML IJ SOSY
50.0000 ug | PREFILLED_SYRINGE | INTRAMUSCULAR | Status: AC | PRN
  Administered 2021-10-08 (×5): 50 ug via INTRAVENOUS

## 2021-10-08 MED ORDER — ASPIRIN EC 81 MG PO TBEC: 81.0000 mg | DELAYED_RELEASE_TABLET | Freq: Two times a day (BID) | ORAL | 0 refills | Status: AC

## 2021-10-08 MED ORDER — LACTATED RINGERS IV BOLUS: 250.0000 mL | Freq: Once | INTRAVENOUS | Status: DC

## 2021-10-08 MED ORDER — DEXAMETHASONE SODIUM PHOSPHATE 10 MG/ML IJ SOLN
INTRAMUSCULAR | Status: AC
  Filled 2021-10-08: qty 1

## 2021-10-08 SURGICAL SUPPLY — 63 items
APL SKNCLS STERI-STRIP NONHPOA (GAUZE/BANDAGES/DRESSINGS) ×1
ATTUNE MED DOME PAT 38 KNEE (Knees) ×1 IMPLANT
ATTUNE PS FEM LT SZ 5 CEM KNEE (Femur) ×1 IMPLANT
ATTUNE PSRP INSR SZ5 5 KNEE (Insert) ×1 IMPLANT
BAG COUNTER SPONGE SURGICOUNT (BAG) ×3 IMPLANT
BAG DECANTER FOR FLEXI CONT (MISCELLANEOUS) ×3 IMPLANT
BAG SPEC THK2 15X12 ZIP CLS (MISCELLANEOUS) ×1
BAG SPNG CNTER NS LX DISP (BAG) ×1
BAG ZIPLOCK 12X15 (MISCELLANEOUS) ×3 IMPLANT
BASE TIBIAL ROT PLAT SZ 5 KNEE (Knees) IMPLANT
BENZOIN TINCTURE PRP APPL 2/3 (GAUZE/BANDAGES/DRESSINGS) ×1 IMPLANT
BLADE SAGITTAL 25.0X1.19X90 (BLADE) ×3 IMPLANT
BLADE SAW SGTL 13X75X1.27 (BLADE) ×3 IMPLANT
BLADE SURG 15 STRL LF DISP TIS (BLADE) ×2 IMPLANT
BLADE SURG 15 STRL SS (BLADE) ×2
BLADE SURG SZ10 CARB STEEL (BLADE) ×6 IMPLANT
BNDG CMPR MED 15X6 ELC VLCR LF (GAUZE/BANDAGES/DRESSINGS) ×1
BNDG ELASTIC 6X15 VLCR STRL LF (GAUZE/BANDAGES/DRESSINGS) ×3 IMPLANT
BOWL SMART MIX CTS (DISPOSABLE) ×3 IMPLANT
BSPLAT TIB 5 CMNT ROT PLAT STR (Knees) ×1 IMPLANT
CEMENT HV SMART SET (Cement) ×2 IMPLANT
CLSR STERI-STRIP ANTIMIC 1/2X4 (GAUZE/BANDAGES/DRESSINGS) ×6 IMPLANT
COVER SURGICAL LIGHT HANDLE (MISCELLANEOUS) ×3 IMPLANT
CUFF TOURN SGL QUICK 34 (TOURNIQUET CUFF) ×2
CUFF TRNQT CYL 34X4.125X (TOURNIQUET CUFF) ×2 IMPLANT
DRAPE INCISE IOBAN 66X45 STRL (DRAPES) ×3 IMPLANT
DRAPE U-SHAPE 47X51 STRL (DRAPES) ×3 IMPLANT
DRESSING AQUACEL AG SP 3.5X10 (GAUZE/BANDAGES/DRESSINGS) ×2 IMPLANT
DRSG AQUACEL AG SP 3.5X10 (GAUZE/BANDAGES/DRESSINGS) ×2
DURAPREP 26ML APPLICATOR (WOUND CARE) ×6 IMPLANT
ELECT REM PT RETURN 15FT ADLT (MISCELLANEOUS) ×3 IMPLANT
GLOVE BIOGEL PI IND STRL 8 (GLOVE) ×4 IMPLANT
GLOVE BIOGEL PI INDICATOR 8 (GLOVE) ×2
GLOVE SURG ORTHO 8.0 STRL STRW (GLOVE) ×3 IMPLANT
GLOVE SURG POLYISO LF SZ7.5 (GLOVE) ×3 IMPLANT
GOWN STRL REUS W/ TWL XL LVL3 (GOWN DISPOSABLE) ×4 IMPLANT
GOWN STRL REUS W/TWL XL LVL3 (GOWN DISPOSABLE) ×4
HANDPIECE INTERPULSE COAX TIP (DISPOSABLE) ×2
HOLDER FOLEY CATH W/STRAP (MISCELLANEOUS) IMPLANT
HOOD PEEL AWAY FLYTE STAYCOOL (MISCELLANEOUS) ×3 IMPLANT
IMMOBILIZER KNEE 20 (SOFTGOODS) ×2
IMMOBILIZER KNEE 20 THIGH 36 (SOFTGOODS) ×2 IMPLANT
KIT TURNOVER KIT A (KITS) IMPLANT
MANIFOLD NEPTUNE II (INSTRUMENTS) ×3 IMPLANT
NEEDLE HYPO 22GX1.5 SAFETY (NEEDLE) ×7 IMPLANT
NS IRRIG 1000ML POUR BTL (IV SOLUTION) ×3 IMPLANT
PACK TOTAL KNEE CUSTOM (KITS) ×3 IMPLANT
PROTECTOR NERVE ULNAR (MISCELLANEOUS) ×3 IMPLANT
SET HNDPC FAN SPRY TIP SCT (DISPOSABLE) ×2 IMPLANT
SPIKE FLUID TRANSFER (MISCELLANEOUS) ×4 IMPLANT
SPONGE T-LAP 18X18 ~~LOC~~+RFID (SPONGE) ×2 IMPLANT
SUT ETHIBOND NAB CT1 #1 30IN (SUTURE) ×6 IMPLANT
SUT MNCRL AB 3-0 PS2 18 (SUTURE) ×3 IMPLANT
SUT VIC AB 0 CT1 36 (SUTURE) ×3 IMPLANT
SUT VIC AB 2-0 CT1 27 (SUTURE) ×4
SUT VIC AB 2-0 CT1 TAPERPNT 27 (SUTURE) ×4 IMPLANT
SYR CONTROL 10ML LL (SYRINGE) ×9 IMPLANT
TIBIAL BASE ROT PLAT SZ 5 KNEE (Knees) ×2 IMPLANT
TOWEL OR 17X26 10 PK STRL BLUE (TOWEL DISPOSABLE) ×3 IMPLANT
TRAY FOLEY MTR SLVR 16FR STAT (SET/KITS/TRAYS/PACK) ×2 IMPLANT
TUBE SUCTION HIGH CAP CLEAR NV (SUCTIONS) ×3 IMPLANT
WATER STERILE IRR 1000ML POUR (IV SOLUTION) ×6 IMPLANT
WRAP KNEE MAXI GEL POST OP (GAUZE/BANDAGES/DRESSINGS) ×3 IMPLANT

## 2021-10-08 NOTE — Progress Notes (Signed)
Orthopedic Tech Progress Note ?Patient Details:  ?[name] ?[birth year] ?[id number] ? ?Ortho Devices ?Type of Ortho Device: Bone foam zero knee ?Ortho Device/Splint Interventions: Ordered ?  ?  ? ?Analiese Krupka E Renelda Kilian ?10/08/2021, 11:08 AM ? ?

## 2021-10-08 NOTE — Evaluation (Signed)
Physical Therapy Evaluation Patient Details Name: [name] MRN: [id number] DOB: May 02, 1961 Today's Date: 10/08/2021  History of Present Illness  60yo female presenting s/p L-TKA. PMH: OA, GERD, Back surgery 2010 & 2014  Clinical Impression  [name] is a 61 y.o. female POD 0 s/p L-TKA. Patient reports independence with mobility at baseline. Patient is now limited by functional impairments (see PT problem list below) and requires min guard for bed mobility, transfers and gait with RW. Patient was able to ambulate 60 feet with RW and min guard assist and cues for safe walker management. Patient educated on safe sequencing for stair mobility and verbalized safe guarding position for people assisting with mobility. Patient instructed in exercises to facilitate ROM and circulation. Patient will benefit from continued skilled PT interventions to address impairments and progress towards PLOF. Patient has met mobility goals at adequate level for discharge home; will continue to follow if pt continues acute stay to progress towards Mod I goals.       Recommendations for follow up therapy are one component of a multi-disciplinary discharge planning process, led by the attending physician.  Recommendations may be updated based on patient status, additional functional criteria and insurance authorization.  Follow Up Recommendations Follow physician's recommendations for discharge plan and follow up therapies    Assistance Recommended at Discharge Intermittent Supervision/Assistance  Patient can return home with the following  A little help with walking and/or transfers;A little help with bathing/dressing/bathroom;Assistance with cooking/housework;Assist for transportation;Help with stairs or ramp for entrance    Equipment Recommendations None recommended by PT (pt has recommended dme)  Recommendations for Other Services       Functional Status Assessment Patient has had a recent decline in their  functional status and demonstrates the ability to make significant improvements in function in a reasonable and predictable amount of time.     Precautions / Restrictions Precautions Precautions: None Restrictions Other Position/Activity Restrictions: RLE WBAT      Mobility  Bed Mobility Overal bed mobility: Needs Assistance Bed Mobility: Supine to Sit     Supine to sit: Min guard     General bed mobility comments: `    Transfers Overall transfer level: Needs assistance Equipment used: Rolling walker (2 wheels) Transfers: Sit to/from Stand Sit to Stand: Min guard           General transfer comment: Min guard for safety only, no physical assist required, multimodal cues for hand placement, sequencing, and powering up through RLE    Ambulation/Gait Ambulation/Gait assistance: Min guard, Supervision Gait Distance (Feet): 60 Feet Assistive device: Rolling walker (2 wheels) Gait Pattern/deviations: Step-to pattern, Decreased stance time - left, Decreased weight shift to left Gait velocity: decreasd     General Gait Details: Pt ambulated with RW and min guard assist progressed to supervision by end of gait task to allow caregiver to practice guarding. No overt LOB noted. Step-to pattern, decreased stance time on L, some slight buckling noted of L knee but pt has good quad control. VCs for proximity to device and shorter step length.  Stairs Stairs: Yes Stairs assistance: Supervision Stair Management: Backwards, With walker, Step to pattern Number of Stairs: 2 General stair comments: Pt and caregiver educated on safe stair management with walker, verbalized understanding. PT provided supervision and cuing and caregiver provided min guard and stabilizing of RW at min assist level. Safe technique with no overt LOB noted.  Wheelchair Mobility    Modified Rankin (Stroke Patients Only)  Balance Overall balance assessment: Needs assistance Sitting-balance support:  Feet unsupported, Bilateral upper extremity supported Sitting balance-Leahy Scale: Fair     Standing balance support: Bilateral upper extremity supported, During functional activity, Reliant on assistive device for balance Standing balance-Leahy Scale: Poor                               Pertinent Vitals/Pain Pain Assessment Pain Assessment: 0-10 Pain Score: 7  Pain Descriptors / Indicators: Operative site guarding, Cramping, Discomfort Pain Intervention(s): Limited activity within patient's tolerance, Monitored during session, Repositioned, Ice applied, Patient requesting pain meds-RN notified    Home Living Family/patient expects to be discharged to:: Private residence Living Arrangements: Spouse/significant other;Other relatives Available Help at Discharge: Friend(s);Available 24 hours/day Type of Home: House Home Access: Stairs to enter Entrance Stairs-Rails: Right Entrance Stairs-Number of Steps: 2   Home Layout: One level Home Equipment: Agricultural consultant (2 wheels);Cane - single point;BSC/3in1      Prior Function Prior Level of Function : Independent/Modified Independent             Mobility Comments: ind ADLs Comments: ind     Hand Dominance        Extremity/Trunk Assessment   Upper Extremity Assessment Upper Extremity Assessment: Overall WFL for tasks assessed    Lower Extremity Assessment Lower Extremity Assessment: RLE deficits/detail;LLE deficits/detail RLE Deficits / Details: MMT ank pf/df 5/5` RLE Sensation: WNL LLE Deficits / Details: MMT ank pf/df 5/5, no extensor lag noted LLE Sensation: decreased light touch (Pt reports remaining numbness on bottom of foot but has sensation on lateral hip)    Cervical / Trunk Assessment Cervical / Trunk Assessment: Back Surgery  Communication   Communication: No difficulties  Cognition Arousal/Alertness: Lethargic Behavior During Therapy: WFL for tasks assessed/performed Overall Cognitive  Status: Within Functional Limits for tasks assessed                                          General Comments      Exercises Total Joint Exercises Ankle Circles/Pumps: AROM, Both, 20 reps Quad Sets: AROM, Left, Other reps (comment) (3) Heel Slides: AROM, AAROM, Left, Other reps (comment) (3) Hip ABduction/ADduction: Left, Other reps (comment), AAROM (2) Straight Leg Raises: AAROM, Left, Other reps (comment) (3)   Assessment/Plan    PT Assessment Patient needs continued PT services  PT Problem List Decreased strength;Decreased range of motion;Decreased activity tolerance;Decreased balance;Decreased mobility;Decreased coordination;Decreased knowledge of use of DME;Pain       PT Treatment Interventions DME instruction;Gait training;Stair training;Functional mobility training;Therapeutic activities;Therapeutic exercise;Balance training;Neuromuscular re-education;Patient/family education    PT Goals (Current goals can be found in the Care Plan section)  Acute Rehab PT Goals Patient Stated Goal: to go home PT Goal Formulation: With patient Time For Goal Achievement: 10/15/21 Potential to Achieve Goals: Good    Frequency 7X/week     Co-evaluation               AM-PAC PT "6 Clicks" Mobility  Outcome Measure Help needed turning from your back to your side while in a flat bed without using bedrails?: A Little Help needed moving from lying on your back to sitting on the side of a flat bed without using bedrails?: A Little Help needed moving to and from a bed to a chair (including a wheelchair)?: A Little Help needed standing  up from a chair using your arms (e.g., wheelchair or bedside chair)?: A Little Help needed to walk in hospital room?: A Little Help needed climbing 3-5 steps with a railing? : A Little 6 Click Score: 18    End of Session Equipment Utilized During Treatment: Gait belt Activity Tolerance: Patient tolerated treatment well Patient left:  in chair;with call bell/phone within reach;with nursing/sitter in room;with family/visitor present Nurse Communication: Mobility status;Patient requests pain meds PT Visit Diagnosis: Difficulty in walking, not elsewhere classified (R26.2)    Time: 6578-4696 PT Time Calculation (min) (ACUTE ONLY): 33 min   Charges:   PT Evaluation $PT Eval Low Complexity: 1 Low PT Treatments $Gait Training: 8-22 mins        Jamesetta Geralds, PT, DPT [facility] Department Office: [phone] Pager: [phone]    Jamesetta Geralds 10/08/2021, 1:18 PM

## 2021-10-08 NOTE — Transfer of Care (Signed)
Immediate Anesthesia Transfer of Care Note ? ?Patient: [name] ? ?Procedure(s) Performed: TOTAL KNEE ARTHROPLASTY (Left: Knee) ? ?Patient Location: PACU ? ?Anesthesia Type:General ? ?Level of Consciousness: awake, alert  and oriented ? ?Airway & Oxygen Therapy: Patient connected to face mask oxygen ? ?Post-op Assessment: Report given to RN and Post -op Vital signs reviewed and stable ? ?Post vital signs: Reviewed and stable ? ?Last Vitals:  ?Vitals Value Taken Time  ?BP 154/86 10/08/21 0938  ?Temp    ?Pulse 87 10/08/21 0940  ?Resp 18 10/08/21 0940  ?SpO2 99 % 10/08/21 0940  ?Vitals shown include unvalidated device data. ? ?Last Pain:  ?Vitals:  ? 10/08/21 0634  ?PainSc: 7   ?   ? ?  ? ?Complications: No notable events documented. ?

## 2021-10-08 NOTE — Anesthesia Postprocedure Evaluation (Signed)
Anesthesia Post Note ? ?Patient: [name] ? ?Procedure(s) Performed: TOTAL KNEE ARTHROPLASTY (Left: Knee) ? ?  ? ?Patient location during evaluation: PACU ?Anesthesia Type: Spinal ?Level of consciousness: awake ?Pain management: pain level controlled ?Vital Signs Assessment: post-procedure vital signs reviewed and stable ?Respiratory status: spontaneous breathing and respiratory function stable ?Cardiovascular status: stable ?Postop Assessment: no apparent nausea or vomiting ?Anesthetic complications: no ? ? ?No notable events documented. ? ?Last Vitals:  ?Vitals:  ? 10/08/21 1130 10/08/21 1134  ?BP: (!) 141/74 (!) 143/76  ?Pulse: 65 65  ?Resp: 12 15  ?Temp: 36.7 ?C   ?SpO2: 99% 96%  ?  ?Last Pain:  ?Vitals:  ? 10/08/21 1134  ?PainSc: 6   ? ? ?  ?  ?  ?  ?  ?  ? ?Merlinda Frederick ? ? ? ? ?

## 2021-10-08 NOTE — Brief Op Note (Signed)
10/08/2021 ? ?9:45 AM ? ?PATIENT:  [name]  61 y.o. female ? ?PRE-OPERATIVE DIAGNOSIS:  OA LEFT KNEE ? ?POST-OPERATIVE DIAGNOSIS:  OA LEFT KNEE ? ?PROCEDURE:  Procedure(s): ?TOTAL KNEE ARTHROPLASTY (Left) ? ?SURGEON:  Surgeon(s) and Role: ?   Frederico Hamman, MD - Primary ? ?PHYSICIAN ASSISTANT: Margart Sickles, PA-C ? ?ASSISTANTS: OR staff x1  ? ?ANESTHESIA:   local, regional, and general ? ?EBL:  50 mL  ? ?BLOOD ADMINISTERED:none ? ?DRAINS: none  ? ?LOCAL MEDICATIONS USED:  MARCAINE    ? ?SPECIMEN:  No Specimen ? ?DISPOSITION OF SPECIMEN:  N/A ? ?COUNTS:  YES ? ?TOURNIQUET:   ?Total Tourniquet Time Documented: ?Thigh (Left) - 52 minutes ?Total: Thigh (Left) - 52 minutes ? ? ?DICTATION: .Other Dictation: Dictation Number unknown ? ?PLAN OF CARE: Discharge to home after PACU ? ?PATIENT DISPOSITION:  PACU - hemodynamically stable. ?  ?Delay start of Pharmacological VTE agent (>24hrs) due to surgical blood loss or risk of bleeding: yes ? ?

## 2021-10-08 NOTE — Anesthesia Procedure Notes (Signed)
Anesthesia Regional Block: Adductor canal block  ? ?Pre-Anesthetic Checklist: , timeout performed,  Correct Patient, Correct Site, Correct Laterality,  Correct Procedure, Correct Position, site marked,  Risks and benefits discussed,  Surgical consent,  Pre-op evaluation,  At surgeon's request and post-op pain management ? ?Laterality: Left ? ?Prep: chloraprep     ?  ?Needles:  ?Injection technique: Single-shot ? ?Needle Type: Echogenic Stimulator Needle   ? ? ?Needle Length: 10cm  ?Needle Gauge: 20  ? ? ? ?Additional Needles: ? ? ?Procedures:,,,, ultrasound used (permanent image in chart),,    ?Narrative:  ?Start time: 10/08/2021 6:50 AM ?End time: 10/08/2021 6:55 AM ?Injection made incrementally with aspirations every 5 mL. ? ?Performed by: Personally  ?Anesthesiologist: Merlinda Frederick, MD ? ?Additional Notes: ?Functioning IV was confirmed and monitors were applied.  Sterile prep and drape,hand hygiene and sterile gloves were used. Ultrasound guidance: relevant anatomy identified, needle position confirmed, local anesthetic spread visualized around nerve(s)., vascular puncture avoided. Negative aspiration and negative test dose prior to incremental administration of local anesthetic. The patient tolerated the procedure well. ? ? ? ? ? ? ?

## 2021-10-08 NOTE — Op Note (Signed)
NAMELESHAE, MCCLAY ?MEDICAL RECORD NO: [id number] ?ACCOUNT NO: [id number] ?DATE OF BIRTH: May 13, 1961 ?FACILITY: [facility] ?LOCATION: [facility] ?PHYSICIAN: W D. Carloyn Manner., MD ? ?Operative Report  ? ?DATE OF PROCEDURE: 10/08/2021 ? ?PREOPERATIVE DIAGNOSIS:  Severe osteoarthritis, left knee. ? ?POSTOPERATIVE DIAGNOSIS:  Severe osteoarthritis, left knee. ? ?PROCEDURE:  Left total knee replacement (DePuy Attune knee) cemented knee, size 5 femur and tibia, 5 mm bearing with 38 mm all poly patella. ? ?SURGEON:  W D. Carloyn Manner., MD ? ?ASSISTANTVincent Peyer, PA. ? ?ANESTHESIA:  General with block. ? ?TOURNIQUET TIME:  45 minutes. ? ?DESCRIPTION OF PROCEDURE:  Straight skin incision was made after exsanguination of the leg and inflation of thigh tourniquet to 350 mmHg.  Medial parapatellar approach to the knee made.  We did a 10 mm, 5-degree valgus cut on the tibia followed by  ?cutting 3 mm below the most diseased medial compartment.  We did a second provisional tibial cut to widen both the flexion and extension gap due to residual tightness.  Femur was sized to be a size 5, placed the all in cutting block accomplishing the  ?anterior, posterior and chamfer cuts.  Complete release of the PCL and removal of osteophytes was done from the posterior aspect of the knee.  We infiltrated the capsular and subcutaneous tissues with a mixture of Exparel and Marcaine.  Tibia was sized  ?to be a size 5.  Placement of the keel cut on the tibia.  Box cut was made on the femur.  We obtained full extension noted after the second provisional cut on the tibia.  Patella was cut, resecting 9.5 mm, placement of a 38 mm all poly trial.  All  ?parameters were checked, relative stability.  She had excellent alignment, resolution of her varus deformity with full extension.  Cement was prepared on the back table and inserted in the doughy state, tibia followed by femur, patella.  Cement was  ?allowed to harden with the trial bearing in.  The trial bearing  was removed.  Excess bits of cement were removed from the posterior aspect of the knee.  Tourniquet was released with the trial bearing out, no excessive bleeding was noted.  Final bearing  ?was placed.  Closure was affected with #1 Ethibond, 2-0 Vicryl, and Monocryl in the skin.  Placed in a knee immobilizer, taken to recovery room in stable condition. ? ? ?NIK ?D: 10/08/2021 9:11:42 am T: 10/08/2021 11:12:00 pm  ?JOB: [id number]/ [id number]  ?

## 2021-10-08 NOTE — Anesthesia Preprocedure Evaluation (Addendum)
Anesthesia Evaluation  ?Patient identified by MRN, date of birth, ID band ?Patient awake ? ? ? ?Reviewed: ?Allergy & Precautions, NPO status , Patient's Chart, lab work & pertinent test results ? ?Airway ?Mallampati: II ? ?TM Distance: >3 FB ?Neck ROM: Full ? ? ? Dental ?no notable dental hx. ? ?  ?Pulmonary ?asthma ,  ?  ?Pulmonary exam normal ?breath sounds clear to auscultation ? ? ? ? ? ? Cardiovascular ?negative cardio ROS ?Normal cardiovascular exam ?Rhythm:Regular Rate:Normal ? ? ?  ?Neuro/Psych ? Headaches, negative psych ROS  ? GI/Hepatic ?Neg liver ROS, GERD  ,  ?Endo/Other  ?negative endocrine ROS ? Renal/GU ?negative Renal ROS  ?negative genitourinary ?  ?Musculoskeletal ? ?(+) Arthritis , Osteoarthritis,  Hardware in back  ? Abdominal ?  ?Peds ?negative pediatric ROS ?(+)  Hematology ?negative hematology ROS ?(+)   ?Anesthesia Other Findings ? ? Reproductive/Obstetrics ?negative OB ROS ? ?  ? ? ? ? ? ? ? ? ? ? ? ? ? ?  ?  ? ? ? ? ? ? ? ? ?Anesthesia Physical ?Anesthesia Plan ? ?ASA: 2 ? ?Anesthesia Plan: General  ? ?Post-op Pain Management: Tylenol PO (pre-op)* and Regional block*  ? ?Induction: Intravenous ? ?PONV Risk Score and Plan: 2 and Treatment may vary due to age or medical condition ? ?Airway Management Planned: Oral ETT ? ?Additional Equipment:  ? ?Intra-op Plan:  ? ?Post-operative Plan: Extubation in OR ? ?Informed Consent: I have reviewed the patients History and Physical, chart, labs and discussed the procedure including the risks, benefits and alternatives for the proposed anesthesia with the patient or authorized representative who has indicated his/her understanding and acceptance.  ? ? ? ?Dental advisory given ? ?Plan Discussed with: CRNA, Anesthesiologist and Surgeon ? ?Anesthesia Plan Comments: (Extensive back surgery with rod placement from upper thoracic to lower lumbar spine 2/2 scoliosis. Discussed with patient and agreed upon plan is General  anesthesia. GA/LMA. Multimodal analgesia. Tanna Furry, MD  ?)  ? ? ? ? ? ?Anesthesia Quick Evaluation ? ?

## 2021-10-08 NOTE — Anesthesia Procedure Notes (Signed)
Procedure Name: LMA Insertion ?Date/Time: 10/08/2021 7:42 AM ?Performed by: Maxwell Caul, CRNA ?Pre-anesthesia Checklist: Patient identified, Emergency Drugs available, Suction available and Patient being monitored ?Patient Re-evaluated:Patient Re-evaluated prior to induction ?Oxygen Delivery Method: Circle system utilized ?Preoxygenation: Pre-oxygenation with 100% oxygen ?Induction Type: IV induction ?LMA: LMA with gastric port inserted ?LMA Size: 4.0 ?Number of attempts: 1 ?Placement Confirmation: positive ETCO2 and breath sounds checked- equal and bilateral ?Tube secured with: Tape ?Dental Injury: Teeth and Oropharynx as per pre-operative assessment  ? ? ? ? ?

## 2021-10-08 NOTE — Discharge Instructions (Signed)

## 2021-10-08 NOTE — Interval H&P Note (Signed)
History and Physical Interval Note: ? ?10/08/2021 ?7:32 AM ? ?[name]  has presented today for surgery, with the diagnosis of OA LEFT KNEE.  The various methods of treatment have been discussed with the patient and family. After consideration of risks, benefits and other options for treatment, the patient has consented to  Procedure(s): ?TOTAL KNEE ARTHROPLASTY (Left) as a surgical intervention.  The patient's history has been reviewed, patient examined, no change in status, stable for surgery.  I have reviewed the patient's chart and labs.  Questions were answered to the patient's satisfaction.   ? ? ?W D Carloyn Manner ? ? ?

## 2021-10-11 ENCOUNTER — Encounter (HOSPITAL_COMMUNITY): Payer: Self-pay | Admitting: Orthopedic Surgery

## 2021-10-22 DIAGNOSIS — Z96652 Presence of left artificial knee joint: Secondary | ICD-10-CM | POA: Diagnosis not present

## 2021-10-22 DIAGNOSIS — M25662 Stiffness of left knee, not elsewhere classified: Secondary | ICD-10-CM | POA: Diagnosis not present

## 2021-10-22 DIAGNOSIS — M1712 Unilateral primary osteoarthritis, left knee: Secondary | ICD-10-CM | POA: Diagnosis not present

## 2021-10-22 DIAGNOSIS — Z471 Aftercare following joint replacement surgery: Secondary | ICD-10-CM | POA: Diagnosis not present

## 2021-10-22 DIAGNOSIS — M6281 Muscle weakness (generalized): Secondary | ICD-10-CM | POA: Diagnosis not present

## 2021-10-22 DIAGNOSIS — R2689 Other abnormalities of gait and mobility: Secondary | ICD-10-CM | POA: Diagnosis not present

## 2021-10-25 DIAGNOSIS — M6281 Muscle weakness (generalized): Secondary | ICD-10-CM | POA: Diagnosis not present

## 2021-10-25 DIAGNOSIS — Z471 Aftercare following joint replacement surgery: Secondary | ICD-10-CM | POA: Diagnosis not present

## 2021-10-25 DIAGNOSIS — M25662 Stiffness of left knee, not elsewhere classified: Secondary | ICD-10-CM | POA: Diagnosis not present

## 2021-10-25 DIAGNOSIS — R2689 Other abnormalities of gait and mobility: Secondary | ICD-10-CM | POA: Diagnosis not present

## 2021-10-25 DIAGNOSIS — Z96652 Presence of left artificial knee joint: Secondary | ICD-10-CM | POA: Diagnosis not present

## 2021-10-28 ENCOUNTER — Other Ambulatory Visit: Payer: Self-pay | Admitting: Family Medicine

## 2021-10-28 DIAGNOSIS — M4326 Fusion of spine, lumbar region: Secondary | ICD-10-CM | POA: Diagnosis not present

## 2021-10-28 DIAGNOSIS — G894 Chronic pain syndrome: Secondary | ICD-10-CM | POA: Diagnosis not present

## 2021-10-28 DIAGNOSIS — M5106 Intervertebral disc disorders with myelopathy, lumbar region: Secondary | ICD-10-CM | POA: Diagnosis not present

## 2021-10-29 MED ORDER — LIDOCAINE 5 % EX OINT: 1.0000 "application " | TOPICAL_OINTMENT | CUTANEOUS | 0 refills | Status: DC | PRN

## 2021-11-01 DIAGNOSIS — Z471 Aftercare following joint replacement surgery: Secondary | ICD-10-CM | POA: Diagnosis not present

## 2021-11-01 DIAGNOSIS — R2689 Other abnormalities of gait and mobility: Secondary | ICD-10-CM | POA: Diagnosis not present

## 2021-11-01 DIAGNOSIS — Z96652 Presence of left artificial knee joint: Secondary | ICD-10-CM | POA: Diagnosis not present

## 2021-11-01 DIAGNOSIS — M25662 Stiffness of left knee, not elsewhere classified: Secondary | ICD-10-CM | POA: Diagnosis not present

## 2021-11-01 DIAGNOSIS — M6281 Muscle weakness (generalized): Secondary | ICD-10-CM | POA: Diagnosis not present

## 2021-11-04 DIAGNOSIS — R2689 Other abnormalities of gait and mobility: Secondary | ICD-10-CM | POA: Diagnosis not present

## 2021-11-04 DIAGNOSIS — M25662 Stiffness of left knee, not elsewhere classified: Secondary | ICD-10-CM | POA: Diagnosis not present

## 2021-11-04 DIAGNOSIS — M6281 Muscle weakness (generalized): Secondary | ICD-10-CM | POA: Diagnosis not present

## 2021-11-04 DIAGNOSIS — Z471 Aftercare following joint replacement surgery: Secondary | ICD-10-CM | POA: Diagnosis not present

## 2021-11-04 DIAGNOSIS — Z96652 Presence of left artificial knee joint: Secondary | ICD-10-CM | POA: Diagnosis not present

## 2021-11-05 DIAGNOSIS — R2689 Other abnormalities of gait and mobility: Secondary | ICD-10-CM | POA: Diagnosis not present

## 2021-11-05 DIAGNOSIS — M25662 Stiffness of left knee, not elsewhere classified: Secondary | ICD-10-CM | POA: Diagnosis not present

## 2021-11-05 DIAGNOSIS — M6281 Muscle weakness (generalized): Secondary | ICD-10-CM | POA: Diagnosis not present

## 2021-11-05 DIAGNOSIS — Z96652 Presence of left artificial knee joint: Secondary | ICD-10-CM | POA: Diagnosis not present

## 2021-11-05 DIAGNOSIS — Z471 Aftercare following joint replacement surgery: Secondary | ICD-10-CM | POA: Diagnosis not present

## 2021-11-08 DIAGNOSIS — M6281 Muscle weakness (generalized): Secondary | ICD-10-CM | POA: Diagnosis not present

## 2021-11-08 DIAGNOSIS — Z96652 Presence of left artificial knee joint: Secondary | ICD-10-CM | POA: Diagnosis not present

## 2021-11-08 DIAGNOSIS — Z471 Aftercare following joint replacement surgery: Secondary | ICD-10-CM | POA: Diagnosis not present

## 2021-11-08 DIAGNOSIS — M25662 Stiffness of left knee, not elsewhere classified: Secondary | ICD-10-CM | POA: Diagnosis not present

## 2021-11-08 DIAGNOSIS — R2689 Other abnormalities of gait and mobility: Secondary | ICD-10-CM | POA: Diagnosis not present

## 2021-11-10 DIAGNOSIS — M25662 Stiffness of left knee, not elsewhere classified: Secondary | ICD-10-CM | POA: Diagnosis not present

## 2021-11-10 DIAGNOSIS — Z471 Aftercare following joint replacement surgery: Secondary | ICD-10-CM | POA: Diagnosis not present

## 2021-11-10 DIAGNOSIS — Z96652 Presence of left artificial knee joint: Secondary | ICD-10-CM | POA: Diagnosis not present

## 2021-11-10 DIAGNOSIS — R2689 Other abnormalities of gait and mobility: Secondary | ICD-10-CM | POA: Diagnosis not present

## 2021-11-10 DIAGNOSIS — M6281 Muscle weakness (generalized): Secondary | ICD-10-CM | POA: Diagnosis not present

## 2021-11-12 DIAGNOSIS — M6281 Muscle weakness (generalized): Secondary | ICD-10-CM | POA: Diagnosis not present

## 2021-11-12 DIAGNOSIS — M25662 Stiffness of left knee, not elsewhere classified: Secondary | ICD-10-CM | POA: Diagnosis not present

## 2021-11-12 DIAGNOSIS — Z96652 Presence of left artificial knee joint: Secondary | ICD-10-CM | POA: Diagnosis not present

## 2021-11-12 DIAGNOSIS — Z471 Aftercare following joint replacement surgery: Secondary | ICD-10-CM | POA: Diagnosis not present

## 2021-11-12 DIAGNOSIS — R2689 Other abnormalities of gait and mobility: Secondary | ICD-10-CM | POA: Diagnosis not present

## 2021-11-15 DIAGNOSIS — Z96652 Presence of left artificial knee joint: Secondary | ICD-10-CM | POA: Diagnosis not present

## 2021-11-15 DIAGNOSIS — R2689 Other abnormalities of gait and mobility: Secondary | ICD-10-CM | POA: Diagnosis not present

## 2021-11-15 DIAGNOSIS — M25662 Stiffness of left knee, not elsewhere classified: Secondary | ICD-10-CM | POA: Diagnosis not present

## 2021-11-15 DIAGNOSIS — Z471 Aftercare following joint replacement surgery: Secondary | ICD-10-CM | POA: Diagnosis not present

## 2021-11-15 DIAGNOSIS — M6281 Muscle weakness (generalized): Secondary | ICD-10-CM | POA: Diagnosis not present

## 2021-11-17 DIAGNOSIS — M6281 Muscle weakness (generalized): Secondary | ICD-10-CM | POA: Diagnosis not present

## 2021-11-17 DIAGNOSIS — Z96652 Presence of left artificial knee joint: Secondary | ICD-10-CM | POA: Diagnosis not present

## 2021-11-17 DIAGNOSIS — R2689 Other abnormalities of gait and mobility: Secondary | ICD-10-CM | POA: Diagnosis not present

## 2021-11-17 DIAGNOSIS — Z471 Aftercare following joint replacement surgery: Secondary | ICD-10-CM | POA: Diagnosis not present

## 2021-11-17 DIAGNOSIS — M25662 Stiffness of left knee, not elsewhere classified: Secondary | ICD-10-CM | POA: Diagnosis not present

## 2021-11-19 DIAGNOSIS — M25662 Stiffness of left knee, not elsewhere classified: Secondary | ICD-10-CM | POA: Diagnosis not present

## 2021-11-19 DIAGNOSIS — R2689 Other abnormalities of gait and mobility: Secondary | ICD-10-CM | POA: Diagnosis not present

## 2021-11-19 DIAGNOSIS — Z471 Aftercare following joint replacement surgery: Secondary | ICD-10-CM | POA: Diagnosis not present

## 2021-11-19 DIAGNOSIS — Z96652 Presence of left artificial knee joint: Secondary | ICD-10-CM | POA: Diagnosis not present

## 2021-11-19 DIAGNOSIS — M6281 Muscle weakness (generalized): Secondary | ICD-10-CM | POA: Diagnosis not present

## 2021-11-23 DIAGNOSIS — M25662 Stiffness of left knee, not elsewhere classified: Secondary | ICD-10-CM | POA: Diagnosis not present

## 2021-11-23 DIAGNOSIS — M6281 Muscle weakness (generalized): Secondary | ICD-10-CM | POA: Diagnosis not present

## 2021-11-23 DIAGNOSIS — Z96652 Presence of left artificial knee joint: Secondary | ICD-10-CM | POA: Diagnosis not present

## 2021-11-23 DIAGNOSIS — R2689 Other abnormalities of gait and mobility: Secondary | ICD-10-CM | POA: Diagnosis not present

## 2021-11-23 DIAGNOSIS — Z471 Aftercare following joint replacement surgery: Secondary | ICD-10-CM | POA: Diagnosis not present

## 2021-11-24 ENCOUNTER — Ambulatory Visit (INDEPENDENT_AMBULATORY_CARE_PROVIDER_SITE_OTHER): Payer: Medicare HMO

## 2021-11-24 DIAGNOSIS — J4550 Severe persistent asthma, uncomplicated: Secondary | ICD-10-CM | POA: Diagnosis not present

## 2021-11-24 DIAGNOSIS — Z471 Aftercare following joint replacement surgery: Secondary | ICD-10-CM | POA: Diagnosis not present

## 2021-11-24 DIAGNOSIS — M6281 Muscle weakness (generalized): Secondary | ICD-10-CM | POA: Diagnosis not present

## 2021-11-24 DIAGNOSIS — Z96652 Presence of left artificial knee joint: Secondary | ICD-10-CM | POA: Diagnosis not present

## 2021-11-24 DIAGNOSIS — M25662 Stiffness of left knee, not elsewhere classified: Secondary | ICD-10-CM | POA: Diagnosis not present

## 2021-11-24 DIAGNOSIS — R2689 Other abnormalities of gait and mobility: Secondary | ICD-10-CM | POA: Diagnosis not present

## 2021-11-26 DIAGNOSIS — R2689 Other abnormalities of gait and mobility: Secondary | ICD-10-CM | POA: Diagnosis not present

## 2021-11-26 DIAGNOSIS — M1712 Unilateral primary osteoarthritis, left knee: Secondary | ICD-10-CM | POA: Diagnosis not present

## 2021-11-26 DIAGNOSIS — Z471 Aftercare following joint replacement surgery: Secondary | ICD-10-CM | POA: Diagnosis not present

## 2021-11-26 DIAGNOSIS — M25662 Stiffness of left knee, not elsewhere classified: Secondary | ICD-10-CM | POA: Diagnosis not present

## 2021-11-26 DIAGNOSIS — M6281 Muscle weakness (generalized): Secondary | ICD-10-CM | POA: Diagnosis not present

## 2021-11-26 DIAGNOSIS — Z96652 Presence of left artificial knee joint: Secondary | ICD-10-CM | POA: Diagnosis not present

## 2021-11-29 DIAGNOSIS — Z96652 Presence of left artificial knee joint: Secondary | ICD-10-CM | POA: Diagnosis not present

## 2021-11-29 DIAGNOSIS — M4326 Fusion of spine, lumbar region: Secondary | ICD-10-CM | POA: Diagnosis not present

## 2021-11-29 DIAGNOSIS — R2689 Other abnormalities of gait and mobility: Secondary | ICD-10-CM | POA: Diagnosis not present

## 2021-11-29 DIAGNOSIS — M6281 Muscle weakness (generalized): Secondary | ICD-10-CM | POA: Diagnosis not present

## 2021-11-29 DIAGNOSIS — M25662 Stiffness of left knee, not elsewhere classified: Secondary | ICD-10-CM | POA: Diagnosis not present

## 2021-11-29 DIAGNOSIS — G894 Chronic pain syndrome: Secondary | ICD-10-CM | POA: Diagnosis not present

## 2021-11-29 DIAGNOSIS — Z471 Aftercare following joint replacement surgery: Secondary | ICD-10-CM | POA: Diagnosis not present

## 2021-12-01 DIAGNOSIS — R2689 Other abnormalities of gait and mobility: Secondary | ICD-10-CM | POA: Diagnosis not present

## 2021-12-01 DIAGNOSIS — M6281 Muscle weakness (generalized): Secondary | ICD-10-CM | POA: Diagnosis not present

## 2021-12-01 DIAGNOSIS — Z471 Aftercare following joint replacement surgery: Secondary | ICD-10-CM | POA: Diagnosis not present

## 2021-12-01 DIAGNOSIS — M25662 Stiffness of left knee, not elsewhere classified: Secondary | ICD-10-CM | POA: Diagnosis not present

## 2021-12-01 DIAGNOSIS — Z96652 Presence of left artificial knee joint: Secondary | ICD-10-CM | POA: Diagnosis not present

## 2021-12-03 ENCOUNTER — Encounter: Payer: Self-pay | Admitting: Allergy & Immunology

## 2021-12-13 ENCOUNTER — Ambulatory Visit: Admit: 2021-12-13 | Payer: Medicare Other | Admitting: Orthopedic Surgery

## 2021-12-13 SURGERY — ARTHROPLASTY, KNEE, TOTAL
Anesthesia: Choice | Site: Knee | Laterality: Left

## 2021-12-16 NOTE — Telephone Encounter (Signed)
I called the patient back to discuss. This started last year.   She had her surgery on April 14th. Then her [facility] Challenger was May 31st. She was feeling great before the surgery.   A few days after the shot, the right leg (not the one that had surgery) and the left leg (the one that she did have surgery on) were BOTH swollen. This is how she felt before her surgery.   She does not want any prednisone since her symptoms seem to be improving. She is open to changing to [place]. We will avoid Dupixent since this is known to cause arthralgias.   Malachi Bonds, MD [facility]

## 2021-12-20 ENCOUNTER — Ambulatory Visit (INDEPENDENT_AMBULATORY_CARE_PROVIDER_SITE_OTHER): Payer: Medicare HMO | Admitting: Family Medicine

## 2021-12-20 ENCOUNTER — Encounter: Payer: Self-pay | Admitting: Family Medicine

## 2021-12-20 DIAGNOSIS — L255 Unspecified contact dermatitis due to plants, except food: Secondary | ICD-10-CM | POA: Diagnosis not present

## 2021-12-20 MED ORDER — PREDNISONE 10 MG (21) PO TBPK: ORAL_TABLET | ORAL | 0 refills | Status: DC

## 2021-12-29 DIAGNOSIS — M4326 Fusion of spine, lumbar region: Secondary | ICD-10-CM | POA: Diagnosis not present

## 2021-12-29 DIAGNOSIS — G894 Chronic pain syndrome: Secondary | ICD-10-CM | POA: Diagnosis not present

## 2021-12-30 ENCOUNTER — Ambulatory Visit: Payer: Self-pay | Admitting: Physician Assistant

## 2021-12-30 DIAGNOSIS — M1711 Unilateral primary osteoarthritis, right knee: Secondary | ICD-10-CM | POA: Diagnosis not present

## 2021-12-30 DIAGNOSIS — G8929 Other chronic pain: Secondary | ICD-10-CM

## 2021-12-30 DIAGNOSIS — M1712 Unilateral primary osteoarthritis, left knee: Secondary | ICD-10-CM | POA: Diagnosis not present

## 2021-12-30 NOTE — H&P (View-Only) (Signed)
TOTAL KNEE ADMISSION H&P  Patient is being admitted for right total knee arthroplasty.  Subjective:  Chief Complaint:right knee pain.  HPI: [name], 61 y.o. female, has a history of pain and functional disability in the right knee due to arthritis and has failed non-surgical conservative treatments for greater than 12 weeks to includeNSAID's and/or analgesics, corticosteriod injections, viscosupplementation injections, supervised PT with diminished ADL's post treatment, use of assistive devices, and activity modification.  Onset of symptoms was gradual, starting >10 years ago with gradually worsening course since that time. The patient noted prior procedures on the knee to include  menisectomy and microfracture  on the right knee(s).  Patient currently rates pain in the right knee(s) at 8 out of 10 with activity. Patient has night pain, worsening of pain with activity and weight bearing, pain that interferes with activities of daily living, pain with passive range of motion, crepitus, and joint swelling.  Patient has evidence of periarticular osteophytes and joint space narrowing by imaging studies. There is no active infection.  Patient Active Problem List   Diagnosis Date Noted   GERD (gastroesophageal reflux disease)    Positive colorectal cancer screening using Cologuard test    Esophageal dysphagia    Varicose veins of bilateral lower extremities with pain 01/02/2020   Seasonal and perennial allergic rhinitis 04/15/2018   Fusion of spine, thoracolumbar region 11/29/2016   Chronic insomnia 11/29/2016   Eosinophilia 03/29/2016   Moderate persistent asthma, uncomplicated [id number]   Mixed rhinitis 03/29/2016   Other social stressor 03/29/2016   Chronic migraine w/o aura w/o status migrainosus, not intractable 03/14/2016   Arthritis of knee, degenerative 07/09/2015   Chronic low back pain 04/15/2015   Cough variant asthma 08/09/2014   Pulmonary infiltrates with eosinophilia ([place])  08/09/2014   Past Medical History:  Diagnosis Date   Arthritis    Spine, R knee   Asthma    Back pain    Rod in back   GERD (gastroesophageal reflux disease)    History of blood transfusion    Back surgery   hx Cough 09/03/2014   Followed in Pulmonary clinic/ [facility]/ [place] - sinus CT 09/08/2014> Mild mucosal thickening involves the paranasal sinuses  - Eos 1.2  08/06/14 - Allergy profile 11/27/14 >  Eos 1.0/ IgE  41 with neg RAST      Past Surgical History:  Procedure Laterality Date   ABDOMINAL HYSTERECTOMY  2004   "cysts", pelvic pain   BACK SURGERY  2010   rod   BACK SURGERY  2014   broken hardware removal   BIOPSY  05/19/2021   Procedure: BIOPSY;  Surgeon: Rogene Houston, MD;  Location: [facility];  Service: Endoscopy;;   COLONOSCOPY WITH PROPOFOL N/A 05/19/2021   Procedure: COLONOSCOPY WITH PROPOFOL;  Surgeon: Rogene Houston, MD;  Location: [facility];  Service: Endoscopy;  Laterality: N/A;  10:10   ESOPHAGEAL DILATION N/A 05/19/2021   Procedure: ESOPHAGEAL DILATION;  Surgeon: Rogene Houston, MD;  Location: [facility];  Service: Endoscopy;  Laterality: N/A;   ESOPHAGOGASTRODUODENOSCOPY (EGD) WITH PROPOFOL N/A 05/19/2021   Procedure: ESOPHAGOGASTRODUODENOSCOPY (EGD) WITH PROPOFOL;  Surgeon: Rogene Houston, MD;  Location: [facility];  Service: Endoscopy;  Laterality: N/A;   KNEE ARTHROSCOPY Right 2017   KNEE ARTHROSCOPY Left 2022   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   Procedure: TOTAL KNEE ARTHROPLASTY;  Surgeon: Earlie Server, MD;  Location: [facility];  Service: Orthopedics;  Laterality: Left;  Current Outpatient Medications  Medication Sig Dispense Refill Last Dose   albuterol (VENTOLIN HFA) 108 (90 Base) MCG/ACT inhaler Inhale 2 puffs into the lungs every 4 (four) hours as needed for wheezing or shortness of breath. 1 each 2    azelastine (ASTELIN) 0.1 % nasal spray Place 2 sprays into both nostrils 2 (two) times daily. Use in each nostril  as directed 30 mL 5    Benralizumab (FASENRA) 30 MG/ML SOSY Inject 1 mL (30 mg total) into the skin every 8 (eight) weeks. 1 mL 6    budesonide-formoterol (SYMBICORT) 160-4.5 MCG/ACT inhaler Inhale 2 puffs into the lungs 2 (two) times daily. (Patient taking differently: Inhale 1 puff into the lungs 2 (two) times daily.) 10.2 g 5    celecoxib (CELEBREX) 200 MG capsule Take 1 capsule (200 mg total) by mouth 2 (two) times daily. 60 capsule 2    EPINEPHrine 0.3 mg/0.3 mL IJ SOAJ injection Inject 0.3 mg into the muscle as needed for anaphylaxis.      Flaxseed, Linseed, (FLAXSEED OIL) 1000 MG CAPS Take 1,000 mg by mouth daily. With Omega 3-6-9      fluticasone (FLONASE) 50 MCG/ACT nasal spray Place 2 sprays into both nostrils in the morning and at bedtime. 16 g 5    hydrOXYzine (ATARAX/VISTARIL) 50 MG tablet TAKE 1/2 TO 1 TABLET BY MOUTH EVERY 8 HOURS AS NEEDED FOR ANXIETY 30 tablet prn    ipratropium-albuterol (DUONEB) 0.5-2.5 (3) MG/3ML SOLN Take 3 mLs by nebulization every 4 (four) hours as needed. 360 mL 0    levocetirizine (XYZAL) 5 MG tablet Take 1 tablet (5 mg total) by mouth 2 (two) times daily as needed for allergies (Can take an extra dose during flare ups.). (Patient taking differently: Take 5 mg by mouth daily.) 180 tablet 1    lidocaine (XYLOCAINE) 5 % ointment Apply 1 application. topically as needed. 35.44 g 0    montelukast (SINGULAIR) 10 MG tablet Take 1 tablet (10 mg total) by mouth at bedtime. 90 tablet 1    morphine (MSIR) 15 MG tablet Take 15 mg by mouth every 8 (eight) hours as needed. Back Pain      Multiple Vitamins-Minerals (MULTIVITAMIN WITH MINERALS) tablet Take 1 tablet by mouth daily.      oxyCODONE (OXY IR/ROXICODONE) 5 MG immediate release tablet Take one tab po q6hrs prn breakthrough pain 28 tablet 0    pantoprazole (PROTONIX) 40 MG tablet TAKE 1 TABLET BY MOUTH DAILY 30 tablet 5    predniSONE (STERAPRED UNI-PAK 21 TAB) 10 MG (21) TBPK tablet Use as directed on back of pill  pack 21 tablet 0    Propylene Glycol (SYSTANE BALANCE) 0.6 % SOLN Place 1 drop into both eyes as needed (dry eyes).      tiZANidine (ZANAFLEX) 4 MG tablet Take 4 mg by mouth 2 (two) times daily as needed for muscle spasms.      Turmeric 500 MG CAPS Take 500 mg by mouth 2 (two) times daily as needed (pain).      Current Facility-Administered Medications  Medication Dose Route Frequency Provider Last Rate Last Admin   Benralizumab SOSY 30 mg  30 mg Subcutaneous Q8 Toney Reil, MD   30 mg at 11/24/21 [id number]   No Known Allergies  Social History   Tobacco Use   Smoking status: Never   Smokeless tobacco: Never  Substance Use Topics   Alcohol use: No    Family History  Problem Relation Age of Onset  Emphysema Maternal Grandmother        never smoker   COPD Maternal Grandmother    Emphysema Maternal Uncle        smoked   COPD Maternal Uncle    Arthritis Mother    Diabetes Mother    Hyperlipidemia Mother    Hypertension Mother    Early death Father 39       MVA   Asthma Sister    Asthma Brother    Heart disease Maternal Grandfather      Review of Systems  HENT:  Positive for tinnitus.   Respiratory:  Positive for wheezing.   Musculoskeletal:  Positive for arthralgias.  All other systems reviewed and are negative.   Objective:  Physical Exam Constitutional:      General: She is not in acute distress.    Appearance: Normal appearance.  HENT:     Head: Normocephalic and atraumatic.  Eyes:     Extraocular Movements: Extraocular movements intact.     Pupils: Pupils are equal, round, and reactive to light.  Cardiovascular:     Rate and Rhythm: Normal rate and regular rhythm.     Pulses: Normal pulses.     Heart sounds: Normal heart sounds.  Pulmonary:     Effort: Pulmonary effort is normal. No respiratory distress.     Breath sounds: Wheezing present.  Abdominal:     General: Abdomen is flat. Bowel sounds are normal. There is no distension.      Palpations: Abdomen is soft.     Tenderness: There is no abdominal tenderness.  Musculoskeletal:     Cervical back: Normal range of motion and neck supple.     Right knee: Swelling and bony tenderness present. No effusion or erythema. Decreased range of motion. Tenderness present over the medial joint line.  Lymphadenopathy:     Cervical: No cervical adenopathy.  Skin:    General: Skin is warm and dry.     Findings: No erythema or rash.  Neurological:     General: No focal deficit present.     Mental Status: She is alert and oriented to person, place, and time.  Psychiatric:        Mood and Affect: Mood normal.        Behavior: Behavior normal.     Vital signs in last 24 hours: @VSRANGES @  Labs:   Estimated body mass index is 29.28 kg/m as calculated from the following:   Height as of 10/08/21: 5\' 7"  (1.702 m).   Weight as of 10/08/21: 84.8 kg.   Imaging Review Plain radiographs demonstrate moderate degenerative joint disease of the right knee(s). The overall alignment issignificant varus. The bone quality appears to be good for age and reported activity level.      Assessment/Plan:  End stage arthritis, right knee   The patient history, physical examination, clinical judgment of the provider and imaging studies are consistent with end stage degenerative joint disease of the right knee(s) and total knee arthroplasty is deemed medically necessary. The treatment options including medical management, injection therapy arthroscopy and arthroplasty were discussed at length. The risks and benefits of total knee arthroplasty were presented and reviewed. The risks due to aseptic loosening, infection, stiffness, patella tracking problems, thromboembolic complications and other imponderables were discussed. The patient acknowledged the explanation, agreed to proceed with the plan and consent was signed. Patient is being admitted for inpatient treatment for surgery, pain control, PT, OT,  prophylactic antibiotics, VTE prophylaxis, progressive ambulation and ADL's and  discharge planning. The patient is planning to be discharged  home with outpt PT.    Anticipated LOS equal to or greater than 2 midnights due to - Age 72 and older with one or more of the following:  - Obesity  - Expected need for hospital services (PT, OT, Nursing) required for safe  discharge  - Anticipated need for postoperative skilled nursing care or inpatient rehab  - Active co-morbidities: Chronic pain requiring opiods and Respiratory Failure/COPD OR   - Unanticipated findings during/Post Surgery: None  - Patient is a high risk of re-admission due to: None

## 2021-12-30 NOTE — H&P (Signed)
TOTAL KNEE ADMISSION H&P  Patient is being admitted for right total knee arthroplasty.  Subjective:  Chief Complaint:right knee pain.  HPI: [name], 61 y.o. female, has a history of pain and functional disability in the right knee due to arthritis and has failed non-surgical conservative treatments for greater than 12 weeks to includeNSAID's and/or analgesics, corticosteriod injections, viscosupplementation injections, supervised PT with diminished ADL's post treatment, use of assistive devices, and activity modification.  Onset of symptoms was gradual, starting >10 years ago with gradually worsening course since that time. The patient noted prior procedures on the knee to include  menisectomy and microfracture  on the right knee(s).  Patient currently rates pain in the right knee(s) at 8 out of 10 with activity. Patient has night pain, worsening of pain with activity and weight bearing, pain that interferes with activities of daily living, pain with passive range of motion, crepitus, and joint swelling.  Patient has evidence of periarticular osteophytes and joint space narrowing by imaging studies. There is no active infection.  Patient Active Problem List   Diagnosis Date Noted   GERD (gastroesophageal reflux disease)    Positive colorectal cancer screening using Cologuard test    Esophageal dysphagia    Varicose veins of bilateral lower extremities with pain 01/02/2020   Seasonal and perennial allergic rhinitis 04/15/2018   Fusion of spine, thoracolumbar region 11/29/2016   Chronic insomnia 11/29/2016   Eosinophilia 03/29/2016   Moderate persistent asthma, uncomplicated [id number]   Mixed rhinitis 03/29/2016   Other social stressor 03/29/2016   Chronic migraine w/o aura w/o status migrainosus, not intractable 03/14/2016   Arthritis of knee, degenerative 07/09/2015   Chronic low back pain 04/15/2015   Cough variant asthma 08/09/2014   Pulmonary infiltrates with eosinophilia ([place])  08/09/2014   Past Medical History:  Diagnosis Date   Arthritis    Spine, R knee   Asthma    Back pain    Rod in back   GERD (gastroesophageal reflux disease)    History of blood transfusion    Back surgery   hx Cough 09/03/2014   Followed in Pulmonary clinic/ [facility]/ [facility] - sinus CT 09/08/2014> Mild mucosal thickening involves the paranasal sinuses  - Eos 1.2  08/06/14 - Allergy profile 11/27/14 >  Eos 1.0/ IgE  41 with neg RAST      Past Surgical History:  Procedure Laterality Date   ABDOMINAL HYSTERECTOMY  2004   "cysts", pelvic pain   BACK SURGERY  2010   rod   BACK SURGERY  2014   broken hardware removal   BIOPSY  05/19/2021   Procedure: BIOPSY;  Surgeon: Rogene Houston, MD;  [facility];  Service: Endoscopy;;   COLONOSCOPY WITH PROPOFOL N/A 05/19/2021   Procedure: COLONOSCOPY WITH PROPOFOL;  Surgeon: Rogene Houston, MD;  [facility];  Service: Endoscopy;  Laterality: N/A;  10:10   ESOPHAGEAL DILATION N/A 05/19/2021   Procedure: ESOPHAGEAL DILATION;  Surgeon: Rogene Houston, MD;  [facility];  Service: Endoscopy;  Laterality: N/A;   ESOPHAGOGASTRODUODENOSCOPY (EGD) WITH PROPOFOL N/A 05/19/2021   Procedure: ESOPHAGOGASTRODUODENOSCOPY (EGD) WITH PROPOFOL;  Surgeon: Rogene Houston, MD;  [facility];  Service: Endoscopy;  Laterality: N/A;   KNEE ARTHROSCOPY Right 2017   KNEE ARTHROSCOPY Left 2022   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   Procedure: TOTAL KNEE ARTHROPLASTY;  Surgeon: Earlie Server, MD;  [facility];  Service: Orthopedics;  Laterality: Left;  Current Outpatient Medications  Medication Sig Dispense Refill Last Dose   albuterol (VENTOLIN HFA) 108 (90 Base) MCG/ACT inhaler Inhale 2 puffs into the lungs every 4 (four) hours as needed for wheezing or shortness of breath. 1 each 2    azelastine (ASTELIN) 0.1 % nasal spray Place 2 sprays into both nostrils 2 (two) times daily. Use in each nostril  as directed 30 mL 5    Benralizumab (FASENRA) 30 MG/ML SOSY Inject 1 mL (30 mg total) into the skin every 8 (eight) weeks. 1 mL 6    budesonide-formoterol (SYMBICORT) 160-4.5 MCG/ACT inhaler Inhale 2 puffs into the lungs 2 (two) times daily. (Patient taking differently: Inhale 1 puff into the lungs 2 (two) times daily.) 10.2 g 5    celecoxib (CELEBREX) 200 MG capsule Take 1 capsule (200 mg total) by mouth 2 (two) times daily. 60 capsule 2    EPINEPHrine 0.3 mg/0.3 mL IJ SOAJ injection Inject 0.3 mg into the muscle as needed for anaphylaxis.      Flaxseed, Linseed, (FLAXSEED OIL) 1000 MG CAPS Take 1,000 mg by mouth daily. With Omega 3-6-9      fluticasone (FLONASE) 50 MCG/ACT nasal spray Place 2 sprays into both nostrils in the morning and at bedtime. 16 g 5    hydrOXYzine (ATARAX/VISTARIL) 50 MG tablet TAKE 1/2 TO 1 TABLET BY MOUTH EVERY 8 HOURS AS NEEDED FOR ANXIETY 30 tablet prn    ipratropium-albuterol (DUONEB) 0.5-2.5 (3) MG/3ML SOLN Take 3 mLs by nebulization every 4 (four) hours as needed. 360 mL 0    levocetirizine (XYZAL) 5 MG tablet Take 1 tablet (5 mg total) by mouth 2 (two) times daily as needed for allergies (Can take an extra dose during flare ups.). (Patient taking differently: Take 5 mg by mouth daily.) 180 tablet 1    lidocaine (XYLOCAINE) 5 % ointment Apply 1 application. topically as needed. 35.44 g 0    montelukast (SINGULAIR) 10 MG tablet Take 1 tablet (10 mg total) by mouth at bedtime. 90 tablet 1    morphine (MSIR) 15 MG tablet Take 15 mg by mouth every 8 (eight) hours as needed. Back Pain      Multiple Vitamins-Minerals (MULTIVITAMIN WITH MINERALS) tablet Take 1 tablet by mouth daily.      oxyCODONE (OXY IR/ROXICODONE) 5 MG immediate release tablet Take one tab po q6hrs prn breakthrough pain 28 tablet 0    pantoprazole (PROTONIX) 40 MG tablet TAKE 1 TABLET BY MOUTH DAILY 30 tablet 5    predniSONE (STERAPRED UNI-PAK 21 TAB) 10 MG (21) TBPK tablet Use as directed on back of pill  pack 21 tablet 0    Propylene Glycol (SYSTANE BALANCE) 0.6 % SOLN Place 1 drop into both eyes as needed (dry eyes).      tiZANidine (ZANAFLEX) 4 MG tablet Take 4 mg by mouth 2 (two) times daily as needed for muscle spasms.      Turmeric 500 MG CAPS Take 500 mg by mouth 2 (two) times daily as needed (pain).      Current Facility-Administered Medications  Medication Dose Route Frequency Provider Last Rate Last Admin   Benralizumab SOSY 30 mg  30 mg Subcutaneous Q8 Toney Reil, MD   30 mg at 11/24/21 [id number]   No Known Allergies  Social History   Tobacco Use   Smoking status: Never   Smokeless tobacco: Never  Substance Use Topics   Alcohol use: No    Family History  Problem Relation Age of Onset  Emphysema Maternal Grandmother        never smoker   COPD Maternal Grandmother    Emphysema Maternal Uncle        smoked   COPD Maternal Uncle    Arthritis Mother    Diabetes Mother    Hyperlipidemia Mother    Hypertension Mother    Early death Father 39       MVA   Asthma Sister    Asthma Brother    Heart disease Maternal Grandfather      Review of Systems  HENT:  Positive for tinnitus.   Respiratory:  Positive for wheezing.   Musculoskeletal:  Positive for arthralgias.  All other systems reviewed and are negative.   Objective:  Physical Exam Constitutional:      General: She is not in acute distress.    Appearance: Normal appearance.  HENT:     Head: Normocephalic and atraumatic.  Eyes:     Extraocular Movements: Extraocular movements intact.     Pupils: Pupils are equal, round, and reactive to light.  Cardiovascular:     Rate and Rhythm: Normal rate and regular rhythm.     Pulses: Normal pulses.     Heart sounds: Normal heart sounds.  Pulmonary:     Effort: Pulmonary effort is normal. No respiratory distress.     Breath sounds: Wheezing present.  Abdominal:     General: Abdomen is flat. Bowel sounds are normal. There is no distension.      Palpations: Abdomen is soft.     Tenderness: There is no abdominal tenderness.  Musculoskeletal:     Cervical back: Normal range of motion and neck supple.     Right knee: Swelling and bony tenderness present. No effusion or erythema. Decreased range of motion. Tenderness present over the medial joint line.  Lymphadenopathy:     Cervical: No cervical adenopathy.  Skin:    General: Skin is warm and dry.     Findings: No erythema or rash.  Neurological:     General: No focal deficit present.     Mental Status: She is alert and oriented to person, place, and time.  Psychiatric:        Mood and Affect: Mood normal.        Behavior: Behavior normal.     Vital signs in last 24 hours: @VSRANGES @  Labs:   Estimated body mass index is 29.28 kg/m as calculated from the following:   Height as of 10/08/21: 5\' 7"  (1.702 m).   Weight as of 10/08/21: 84.8 kg.   Imaging Review Plain radiographs demonstrate moderate degenerative joint disease of the right knee(s). The overall alignment issignificant varus. The bone quality appears to be good for age and reported activity level.      Assessment/Plan:  End stage arthritis, right knee   The patient history, physical examination, clinical judgment of the provider and imaging studies are consistent with end stage degenerative joint disease of the right knee(s) and total knee arthroplasty is deemed medically necessary. The treatment options including medical management, injection therapy arthroscopy and arthroplasty were discussed at length. The risks and benefits of total knee arthroplasty were presented and reviewed. The risks due to aseptic loosening, infection, stiffness, patella tracking problems, thromboembolic complications and other imponderables were discussed. The patient acknowledged the explanation, agreed to proceed with the plan and consent was signed. Patient is being admitted for inpatient treatment for surgery, pain control, PT, OT,  prophylactic antibiotics, VTE prophylaxis, progressive ambulation and ADL's and  discharge planning. The patient is planning to be discharged  home with outpt PT.    Anticipated LOS equal to or greater than 2 midnights due to - Age 72 and older with one or more of the following:  - Obesity  - Expected need for hospital services (PT, OT, Nursing) required for safe  discharge  - Anticipated need for postoperative skilled nursing care or inpatient rehab  - Active co-morbidities: Chronic pain requiring opiods and Respiratory Failure/COPD OR   - Unanticipated findings during/Post Surgery: None  - Patient is a high risk of re-admission due to: None

## 2022-01-13 NOTE — Progress Notes (Addendum)
Anesthesia Review:  PCP:  DR Lorne Skeens, NP- [facility] 12/20/21  Cardiologist : none  Chest x-ray : 05/23/21- 1view  EKG :08/23/21  CT chest- 10/26/20  Echo : Stress test: 2021  Cardiac Cath :  Activity level: can do a flight of stairs without difficulty  Sleep Study/ CPAP : none  Fasting Blood Sugar :      / Checks Blood Sugar -- times a day:   Blood Thinner/ Instructions /Last Dose: ASA / Instructions/ Last Dose :   12/13/21- surgery cancelled with Dr Lequita Halt  09/2021- left knee

## 2022-01-17 NOTE — Progress Notes (Signed)
DUE TO COVID-19 ONLY ONE VISITOR IS ALLOWED TO COME WITH YOU AND STAY IN THE WAITING ROOM ONLY DURING PRE OP AND PROCEDURE DAY OF SURGERY.  2 VISITOR  MAY VISIT WITH YOU AFTER SURGERY IN YOUR PRIVATE ROOM DURING VISITING HOURS ONLY! YOU MAY HAVE ONE PERSON SPEND THE NITE WITH YOU IN YOUR ROOM AFTER SURGERY.       Your procedure is scheduled on:           01/28/2022   Report to [facility] Main  Entrance   Report to admitting at      0515            AM DO NOT BRING INSURANCE CARD, PICTURE ID OR WALLET DAY OF SURGERY.      Call this number if you have problems the morning of surgery [phone]    REMEMBER: NO  SOLID FOODS , CANDY, GUM OR MINTS AFTER MIDNITE THE NITE BEFORE SURGERY .       Marland Kitchen CLEAR LIQUIDS UNTIL    0430am             DAY OF SURGERY.      PLEASE FINISH ENSURE DRINK PER SURGEON ORDER  WHICH NEEDS TO BE COMPLETED AT    0430am      MORNING OF SURGERY.       CLEAR LIQUID DIET   Foods Allowed      WATER BLACK COFFEE ( SUGAR OK, NO MILK, CREAM OR CREAMER) REGULAR AND DECAF  TEA ( SUGAR OK NO MILK, CREAM, OR CREAMER) REGULAR AND DECAF  PLAIN JELLO ( NO RED)  FRUIT ICES ( NO RED, NO FRUIT PULP)  POPSICLES ( NO RED)  JUICE- APPLE, WHITE GRAPE AND WHITE CRANBERRY  SPORT DRINK LIKE GATORADE ( NO RED)  CLEAR BROTH ( VEGETABLE , CHICKEN OR BEEF)                                                                     BRUSH YOUR TEETH MORNING OF SURGERY AND RINSE YOUR MOUTH OUT, NO CHEWING GUM CANDY OR MINTS.     Take these medicines the morning of surgery with A SIP OF WATER:  inhalers as usual and bring, xyzal if needed, protonix , nasal spray    DO NOT TAKE ANY DIABETIC MEDICATIONS DAY OF YOUR SURGERY                               You may not have any metal on your body including hair pins and              piercings  Do not wear jewelry, make-up, lotions, powders or perfumes, deodorant             Do not wear nail polish on your fingernails.              IF YOU ARE  A FEMALE AND WANT TO SHAVE UNDER ARMS OR LEGS PRIOR TO SURGERY YOU MUST DO SO AT LEAST 48 HOURS PRIOR TO SURGERY.              Men may shave face and neck.   Do not bring valuables to the hospital. [facility] IS NOT             RESPONSIBLE   FOR VALUABLES.  Contacts, dentures or bridgework may not be worn into surgery.  Leave suitcase in the car. After surgery it may be brought to your room.     Patients discharged the day of surgery will not be allowed to drive home. IF YOU ARE HAVING SURGERY AND GOING HOME THE SAME DAY, YOU MUST HAVE AN ADULT TO DRIVE YOU HOME AND BE WITH YOU FOR 24 HOURS. YOU MAY GO HOME BY TAXI OR UBER OR ORTHERWISE, BUT AN ADULT MUST ACCOMPANY YOU HOME AND STAY WITH YOU FOR 24 HOURS.                Please read over the following fact sheets you were given: _____________________________________________________________________  [facility] - Preparing for Surgery Before surgery, you can play an important role.  Because skin is not sterile, your skin needs to be as free of germs as possible.  You can reduce the number of germs on your skin by washing with CHG (chlorahexidine gluconate) soap before surgery.  CHG is an antiseptic cleaner which kills germs and bonds with the skin to continue killing germs even after washing. Please DO NOT use if you have an allergy to CHG or antibacterial soaps.  If your skin becomes reddened/irritated stop using the CHG and inform your nurse when you arrive at Short Stay. Do not shave (including legs and underarms) for at least 48 hours prior to the first CHG shower.  You may shave your face/neck. Please follow these instructions carefully:  1.  Shower with CHG Soap the night before surgery and the  morning of Surgery.  2.  If you choose to wash your hair, wash your hair first as usual with your  normal  shampoo.  3.  After you shampoo, rinse your hair and body thoroughly to remove the  shampoo.                           4.  Use CHG as you  would any other liquid soap.  You can apply chg directly  to the skin and wash                       Gently with a scrungie or clean washcloth.  5.  Apply the CHG Soap to your body ONLY FROM THE NECK DOWN.   Do not use on face/ open                           Wound or open sores. Avoid contact with eyes, ears mouth and genitals (private parts).                       Wash face,  Genitals (private parts) with your normal soap.             6.  Wash thoroughly, paying special attention to the area where your surgery  will be performed.  7.  Thoroughly rinse your body with warm water from the neck down.  8.  DO NOT shower/wash with your normal soap after using and rinsing off  the CHG Soap.                9.  Pat yourself dry with a clean towel.  10.  Wear clean pajamas.            11.  Place clean sheets on your bed the night of your first shower and do not  sleep with pets. Day of Surgery : Do not apply any lotions/deodorants the morning of surgery.  Please wear clean clothes to the hospital/surgery center.  FAILURE TO FOLLOW THESE INSTRUCTIONS MAY RESULT IN THE CANCELLATION OF YOUR SURGERY PATIENT SIGNATURE_________________________________  NURSE SIGNATURE__________________________________  ________________________________________________________________________

## 2022-01-18 ENCOUNTER — Other Ambulatory Visit: Payer: Self-pay

## 2022-01-18 ENCOUNTER — Encounter (HOSPITAL_COMMUNITY)
Admission: RE | Admit: 2022-01-18 | Discharge: 2022-01-18 | Disposition: A | Discharge status: Home / Self Care | Payer: Medicare HMO | Source: Ambulatory Visit | Attending: Orthopedic Surgery | Admitting: Orthopedic Surgery

## 2022-01-18 ENCOUNTER — Encounter (HOSPITAL_COMMUNITY): Payer: Self-pay

## 2022-01-18 VITALS — BP 136/85 | HR 66 | Temp 98.5°F | Resp 16 | Ht 67.0 in | Wt 178.0 lb

## 2022-01-18 DIAGNOSIS — K219 Gastro-esophageal reflux disease without esophagitis: Secondary | ICD-10-CM | POA: Insufficient documentation

## 2022-01-18 DIAGNOSIS — J45909 Unspecified asthma, uncomplicated: Secondary | ICD-10-CM | POA: Diagnosis not present

## 2022-01-18 DIAGNOSIS — M25561 Pain in right knee: Secondary | ICD-10-CM | POA: Insufficient documentation

## 2022-01-18 DIAGNOSIS — M1711 Unilateral primary osteoarthritis, right knee: Secondary | ICD-10-CM | POA: Insufficient documentation

## 2022-01-18 DIAGNOSIS — G8929 Other chronic pain: Secondary | ICD-10-CM | POA: Insufficient documentation

## 2022-01-18 DIAGNOSIS — Z01812 Encounter for preprocedural laboratory examination: Secondary | ICD-10-CM | POA: Insufficient documentation

## 2022-01-18 DIAGNOSIS — Z01818 Encounter for other preprocedural examination: Secondary | ICD-10-CM

## 2022-01-18 LAB — CBC WITH DIFFERENTIAL/PLATELET
Abs Immature Granulocytes: 0.01 10*3/uL (ref 0.00–0.07)
Basophils Absolute: 0 10*3/uL (ref 0.0–0.1)
Basophils Relative: 0 %
Eosinophils Absolute: 0 10*3/uL (ref 0.0–0.5)
Eosinophils Relative: 0 %
HCT: 40.6 % (ref 36.0–46.0)
Hemoglobin: 13.4 g/dL (ref 12.0–15.0)
Immature Granulocytes: 0 %
Lymphocytes Relative: 41 %
Lymphs Abs: 2.6 10*3/uL (ref 0.7–4.0)
MCH: 29.5 pg (ref 26.0–34.0)
MCHC: 33 g/dL (ref 30.0–36.0)
MCV: 89.4 fL (ref 80.0–100.0)
Monocytes Absolute: 0.5 10*3/uL (ref 0.1–1.0)
Monocytes Relative: 7 %
Neutro Abs: 3.3 10*3/uL (ref 1.7–7.7)
Neutrophils Relative %: 52 %
Platelets: 231 10*3/uL (ref 150–400)
RBC: 4.54 MIL/uL (ref 3.87–5.11)
RDW: 12.9 % (ref 11.5–15.5)
WBC: 6.4 10*3/uL (ref 4.0–10.5)
nRBC: 0 % (ref 0.0–0.2)

## 2022-01-18 LAB — COMPREHENSIVE METABOLIC PANEL
ALT: 27 U/L (ref 0–44)
AST: 26 U/L (ref 15–41)
Albumin: 3.9 g/dL (ref 3.5–5.0)
Alkaline Phosphatase: 112 U/L (ref 38–126)
Anion gap: 6 (ref 5–15)
BUN: 15 mg/dL (ref 6–20)
CO2: 29 mmol/L (ref 22–32)
Calcium: 9.7 mg/dL (ref 8.9–10.3)
Chloride: 107 mmol/L (ref 98–111)
Creatinine, Ser: 0.73 mg/dL (ref 0.44–1.00)
GFR, Estimated: >60 mL/min (ref >60)
Glucose, Bld: 96 mg/dL (ref 70–99)
Potassium: 3.8 mmol/L (ref 3.5–5.1)
Sodium: 142 mmol/L (ref 135–145)
Total Bilirubin: 0.7 mg/dL (ref 0.3–1.2)
Total Protein: 7.1 g/dL (ref 6.5–8.1)

## 2022-01-18 LAB — SURGICAL PCR SCREEN
MRSA, PCR: NEGATIVE
Staphylococcus aureus: NEGATIVE

## 2022-01-19 NOTE — Progress Notes (Signed)
Anesthesia Chart Review   Case: [id number] Date/Time: 01/28/22 0715   Procedure: TOTAL KNEE ARTHROPLASTY (Right: Knee)   Anesthesia type: Spinal   Pre-op diagnosis: OA RIGHT KNEE   [facility] / [facility]   Surgeons: Frederico Hamman, MD       DISCUSSION:61 y.o. never smoker with h/o GERD, asthma, right OA scheduled for above procedure 01/28/2022 with Dr. Frederico Hamman.   Pt with extensive back surgery, hardware in place T9-S1.  General with left total knee 10/08/2021.   Anticipate pt can proceed with planned procedure barring acute status change.   VS: BP 136/85   Pulse 66   Temp 36.9 C (Oral)   Resp 16   Ht 5\' 7"  (1.702 m)   Wt 80.7 kg   LMP 08/30/2002 (Approximate)   SpO2 100%   BMI 27.88 kg/m   PROVIDERS: 10/30/2002, DO is PCP    LABS: Labs reviewed: Acceptable for surgery. (all labs ordered are listed, but only abnormal results are displayed)  Labs Reviewed  SURGICAL PCR SCREEN  CBC WITH DIFFERENTIAL/PLATELET  COMPREHENSIVE METABOLIC PANEL  TYPE AND SCREEN     IMAGES:   EKG: 08/23/2021 Rate 49 bpm  Marked sinus bradycardia  Consider old anterior infarct.   CV: Exercise Tolerance Test 09/25/2019 Blood pressure demonstrated a normal response to exercise. There was no ST segment deviation noted during stress. Duke treadmill score portends a low risk for cardiac events. Past Medical History:  Diagnosis Date   Arthritis    Spine, R knee   Asthma    Back pain    Rod in back   GERD (gastroesophageal reflux disease)    History of blood transfusion    Back surgery   hx Cough 09/03/2014   Followed in Pulmonary clinic/ [facility]/ [facility] - sinus CT 09/08/2014> Mild mucosal thickening involves the paranasal sinuses  - Eos 1.2  08/06/14 - Allergy profile 11/27/14 >  Eos 1.0/ IgE  41 with neg RAST      Past Surgical History:  Procedure Laterality Date   ABDOMINAL HYSTERECTOMY  2004   "cysts", pelvic pain   BACK SURGERY  2010   rod   BACK  SURGERY  2014   broken hardware removal   BIOPSY  05/19/2021   Procedure: BIOPSY;  Surgeon: 05/21/2021, MD;  [facility];  Service: Endoscopy;;   COLONOSCOPY WITH PROPOFOL N/A 05/19/2021   Procedure: COLONOSCOPY WITH PROPOFOL;  Surgeon: 05/21/2021, MD;  [facility];  Service: Endoscopy;  Laterality: N/A;  10:10   ESOPHAGEAL DILATION N/A 05/19/2021   Procedure: ESOPHAGEAL DILATION;  Surgeon: 05/21/2021, MD;  [facility];  Service: Endoscopy;  Laterality: N/A;   ESOPHAGOGASTRODUODENOSCOPY (EGD) WITH PROPOFOL N/A 05/19/2021   Procedure: ESOPHAGOGASTRODUODENOSCOPY (EGD) WITH PROPOFOL;  Surgeon: 05/21/2021, MD;  [facility];  Service: Endoscopy;  Laterality: N/A;   KNEE ARTHROSCOPY Right 2017   KNEE ARTHROSCOPY Left 2022   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   Procedure: TOTAL KNEE ARTHROPLASTY;  Surgeon: 10/10/2021, MD;  [facility];  Service: Orthopedics;  Laterality: Left;    MEDICATIONS:  acetaminophen (TYLENOL) 500 MG tablet   albuterol (VENTOLIN HFA) 108 (90 Base) MCG/ACT inhaler   azelastine (ASTELIN) 0.1 % nasal spray   Benralizumab (FASENRA) 30 MG/ML SOSY   benzonatate (TESSALON) 200 MG capsule   budesonide-formoterol (SYMBICORT) 160-4.5 MCG/ACT inhaler   celecoxib (CELEBREX) 200 MG capsule   EPINEPHrine 0.3  mg/0.3 mL IJ SOAJ injection   fluticasone (FLONASE) 50 MCG/ACT nasal spray   hydrOXYzine (ATARAX/VISTARIL) 50 MG tablet   levocetirizine (XYZAL) 5 MG tablet   lidocaine (XYLOCAINE) 5 % ointment   montelukast (SINGULAIR) 10 MG tablet   morphine (MSIR) 15 MG tablet   Multiple Vitamins-Minerals (MULTIVITAMIN WITH MINERALS) tablet   oxyCODONE (OXY IR/ROXICODONE) 5 MG immediate release tablet   pantoprazole (PROTONIX) 40 MG tablet   Propylene Glycol (SYSTANE BALANCE) 0.6 % SOLN   tiZANidine (ZANAFLEX) 4 MG tablet    Benralizumab SOSY 30 mg    [facility], PA-C [facility] Pre-Surgical  Testing [phone]

## 2022-01-19 NOTE — Anesthesia Preprocedure Evaluation (Addendum)
Anesthesia Evaluation  Patient identified by MRN, date of birth, ID band Patient awake    Airway Mallampati: II  TM Distance: >3 FB Neck ROM: Full    Dental no notable dental hx. (+) Caps, Dental Advisory Given   Pulmonary asthma ,    Pulmonary exam normal breath sounds clear to auscultation       Cardiovascular negative cardio ROS Normal cardiovascular exam Rhythm:Regular Rate:Normal     Neuro/Psych  Headaches, negative psych ROS   GI/Hepatic Neg liver ROS, GERD  Medicated and Controlled,  Endo/Other  Hyperlipidemia  Renal/GU negative Renal ROS  negative genitourinary   Musculoskeletal  (+) Arthritis , Osteoarthritis,  OA right knee Chronic back pain S/P Scoliosis surgery with rods placed 2010   Abdominal   Peds  Hematology negative hematology ROS (+)   Anesthesia Other Findings   Reproductive/Obstetrics                           Anesthesia Physical Anesthesia Plan  ASA: 2  Anesthesia Plan: General   Post-op Pain Management: Regional block*, Precedex, Dilaudid IV and Tylenol PO (pre-op)*   Induction: Intravenous  PONV Risk Score and Plan: 4 or greater  Airway Management Planned: LMA  Additional Equipment: None  Intra-op Plan:   Post-operative Plan: Extubation in OR  Informed Consent: I have reviewed the patients History and Physical, chart, labs and discussed the procedure including the risks, benefits and alternatives for the proposed anesthesia with the patient or authorized representative who has indicated his/her understanding and acceptance.     Dental advisory given  Plan Discussed with: CRNA and Anesthesiologist  Anesthesia Plan Comments: (See PAT note 01/18/2022)      Anesthesia Quick Evaluation

## 2022-01-19 NOTE — Care Plan (Signed)
Ortho Bundle Case Management Note  Patient Details  Name: [name] MRN: [id number] Date of Birth: 28-Jan-1961   Met with patient in the office prior to surgery. She will discharge to home with family to assist. CPM ordered from [place]. Has rolling walker at home. OPPT set up with [facility]. Patient and MD in agreement with plan and choice offered                   DME Arranged:  [facility] DME Agency:  [employer]  HH Arranged:    [place] Agency:     Additional Comments: Please contact me with any questions of if this plan should need to change.  Ladell Heads,  [place] [facility]ist  [phone] 01/19/2022, 2:44 PM

## 2022-01-24 ENCOUNTER — Ambulatory Visit (INDEPENDENT_AMBULATORY_CARE_PROVIDER_SITE_OTHER): Payer: Medicare HMO

## 2022-01-24 DIAGNOSIS — J4550 Severe persistent asthma, uncomplicated: Secondary | ICD-10-CM

## 2022-01-24 MED ORDER — MEPOLIZUMAB 100 MG ~~LOC~~ SOLR
100.0000 mg | SUBCUTANEOUS | Status: DC
  Administered 2022-01-24 – 2022-02-21 (×2): 100 mg via SUBCUTANEOUS

## 2022-01-24 NOTE — Progress Notes (Signed)
Immunotherapy   Patient Details  Name: [name] MRN: [id number] Date of Birth: 1960-11-23  01/24/2022  [name] started injections for  Nucala  Frequency: Every four weeks. Patient will come in next month for a teaching to administer injections at home.  Epi-Pen: not needed. Consent signed and patient instructions given. Patient was instructed to call our office if any problems occur.    Ralene Muskrat 01/24/2022, 9:41 AM

## 2022-01-26 DIAGNOSIS — Z6827 Body mass index (BMI) 27.0-27.9, adult: Secondary | ICD-10-CM | POA: Diagnosis not present

## 2022-01-26 DIAGNOSIS — G894 Chronic pain syndrome: Secondary | ICD-10-CM | POA: Diagnosis not present

## 2022-01-26 DIAGNOSIS — M4326 Fusion of spine, lumbar region: Secondary | ICD-10-CM | POA: Diagnosis not present

## 2022-01-27 ENCOUNTER — Encounter (HOSPITAL_COMMUNITY): Payer: Self-pay | Admitting: Orthopedic Surgery

## 2022-01-27 MED ORDER — TRANEXAMIC ACID 1000 MG/10ML IV SOLN
2000.0000 mg | INTRAVENOUS | Status: DC
  Filled 2022-01-27: qty 20

## 2022-01-28 ENCOUNTER — Encounter (HOSPITAL_COMMUNITY)
Admission: RE | Disposition: A | Discharge status: Home / Self Care | Payer: Self-pay | Source: Home / Self Care | Attending: Orthopedic Surgery

## 2022-01-28 ENCOUNTER — Ambulatory Visit (HOSPITAL_COMMUNITY): Payer: Medicare HMO | Admitting: Physician Assistant

## 2022-01-28 ENCOUNTER — Other Ambulatory Visit: Payer: Self-pay

## 2022-01-28 ENCOUNTER — Encounter (HOSPITAL_COMMUNITY): Payer: Self-pay | Admitting: Orthopedic Surgery

## 2022-01-28 ENCOUNTER — Ambulatory Visit (HOSPITAL_COMMUNITY)
Admission: RE | Admit: 2022-01-28 | Discharge: 2022-01-28 | Disposition: A | Discharge status: Home / Self Care | Payer: Medicare HMO | Attending: Orthopedic Surgery | Admitting: Orthopedic Surgery

## 2022-01-28 ENCOUNTER — Ambulatory Visit (HOSPITAL_BASED_OUTPATIENT_CLINIC_OR_DEPARTMENT_OTHER): Payer: Medicare HMO | Admitting: Anesthesiology

## 2022-01-28 DIAGNOSIS — M1711 Unilateral primary osteoarthritis, right knee: Secondary | ICD-10-CM | POA: Diagnosis not present

## 2022-01-28 DIAGNOSIS — E785 Hyperlipidemia, unspecified: Secondary | ICD-10-CM | POA: Insufficient documentation

## 2022-01-28 DIAGNOSIS — M21161 Varus deformity, not elsewhere classified, right knee: Secondary | ICD-10-CM

## 2022-01-28 DIAGNOSIS — Z01818 Encounter for other preprocedural examination: Secondary | ICD-10-CM

## 2022-01-28 DIAGNOSIS — G8918 Other acute postprocedural pain: Secondary | ICD-10-CM | POA: Diagnosis not present

## 2022-01-28 DIAGNOSIS — K219 Gastro-esophageal reflux disease without esophagitis: Secondary | ICD-10-CM | POA: Insufficient documentation

## 2022-01-28 HISTORY — PX: TOTAL KNEE ARTHROPLASTY: SHX125

## 2022-01-28 LAB — TYPE AND SCREEN
ABO/RH(D): B POS
Antibody Screen: NEGATIVE

## 2022-01-28 SURGERY — ARTHROPLASTY, KNEE, TOTAL
Anesthesia: General | Site: Knee | Laterality: Right

## 2022-01-28 MED ORDER — ASPIRIN 81 MG PO TBEC: 81.0000 mg | DELAYED_RELEASE_TABLET | Freq: Two times a day (BID) | ORAL | 0 refills | Status: AC

## 2022-01-28 MED ORDER — CEFAZOLIN SODIUM-DEXTROSE 2-4 GM/100ML-% IV SOLN
2.0000 g | INTRAVENOUS | Status: AC
  Administered 2022-01-28: 2 g via INTRAVENOUS
  Filled 2022-01-28: qty 100

## 2022-01-28 MED ORDER — HYDROMORPHONE HCL 1 MG/ML IJ SOLN
0.5000 mg | INTRAMUSCULAR | Status: AC | PRN
  Administered 2022-01-28 (×2): 0.5 mg via INTRAVENOUS

## 2022-01-28 MED ORDER — ACETAMINOPHEN 500 MG PO TABS
ORAL_TABLET | ORAL | Status: AC
  Filled 2022-01-28: qty 2

## 2022-01-28 MED ORDER — BUPIVACAINE LIPOSOME 1.3 % IJ SUSP
INTRAMUSCULAR | Status: DC | PRN
  Administered 2022-01-28: 20 mL

## 2022-01-28 MED ORDER — CEFAZOLIN SODIUM-DEXTROSE 1-4 GM/50ML-% IV SOLN: 1.0000 g | Freq: Four times a day (QID) | INTRAVENOUS | Status: DC

## 2022-01-28 MED ORDER — PROPOFOL 1000 MG/100ML IV EMUL
INTRAVENOUS | Status: AC
  Filled 2022-01-28: qty 100

## 2022-01-28 MED ORDER — OXYCODONE HCL 5 MG PO TABS
5.0000 mg | ORAL_TABLET | Freq: Once | ORAL | Status: AC | PRN
  Administered 2022-01-28: 5 mg via ORAL

## 2022-01-28 MED ORDER — BUPIVACAINE-EPINEPHRINE (PF) 0.25% -1:200000 IJ SOLN
INTRAMUSCULAR | Status: AC
  Filled 2022-01-28: qty 30

## 2022-01-28 MED ORDER — PHENYLEPHRINE HCL-NACL 20-0.9 MG/250ML-% IV SOLN
INTRAVENOUS | Status: DC | PRN
  Administered 2022-01-28: 25 ug/min via INTRAVENOUS

## 2022-01-28 MED ORDER — BUPIVACAINE-EPINEPHRINE (PF) 0.25% -1:200000 IJ SOLN
INTRAMUSCULAR | Status: DC | PRN
  Administered 2022-01-28: 30 mL

## 2022-01-28 MED ORDER — TIZANIDINE HCL 4 MG PO TABS: 4.0000 mg | ORAL_TABLET | Freq: Three times a day (TID) | ORAL | 0 refills | Status: AC | PRN

## 2022-01-28 MED ORDER — DEXMEDETOMIDINE HCL IN NACL 80 MCG/20ML IV SOLN
INTRAVENOUS | Status: AC
  Filled 2022-01-28: qty 20

## 2022-01-28 MED ORDER — ROPIVACAINE HCL 5 MG/ML IJ SOLN
INTRAMUSCULAR | Status: DC | PRN
  Administered 2022-01-28: 30 mL via PERINEURAL

## 2022-01-28 MED ORDER — SODIUM CHLORIDE (PF) 0.9 % IJ SOLN
INTRAMUSCULAR | Status: AC
  Filled 2022-01-28: qty 50

## 2022-01-28 MED ORDER — AMISULPRIDE (ANTIEMETIC) 5 MG/2ML IV SOLN: 10.0000 mg | Freq: Once | INTRAVENOUS | Status: DC | PRN

## 2022-01-28 MED ORDER — DEXAMETHASONE SODIUM PHOSPHATE 10 MG/ML IJ SOLN
INTRAMUSCULAR | Status: AC
  Filled 2022-01-28: qty 1

## 2022-01-28 MED ORDER — LIDOCAINE 2% (20 MG/ML) 5 ML SYRINGE
INTRAMUSCULAR | Status: DC | PRN
  Administered 2022-01-28: 60 mg via INTRAVENOUS

## 2022-01-28 MED ORDER — TRANEXAMIC ACID-NACL 1000-0.7 MG/100ML-% IV SOLN
1000.0000 mg | INTRAVENOUS | Status: AC
  Administered 2022-01-28: 1000 mg via INTRAVENOUS
  Filled 2022-01-28: qty 100

## 2022-01-28 MED ORDER — HYDROMORPHONE HCL 1 MG/ML IJ SOLN
0.2500 mg | INTRAMUSCULAR | Status: DC | PRN
  Administered 2022-01-28 (×4): 0.5 mg via INTRAVENOUS

## 2022-01-28 MED ORDER — ACETAMINOPHEN 500 MG PO TABS
1000.0000 mg | ORAL_TABLET | Freq: Four times a day (QID) | ORAL | Status: DC
  Administered 2022-01-28: 1000 mg via ORAL

## 2022-01-28 MED ORDER — TRANEXAMIC ACID 1000 MG/10ML IV SOLN
INTRAVENOUS | Status: DC | PRN
  Administered 2022-01-28: 2000 mg via TOPICAL

## 2022-01-28 MED ORDER — HYDROMORPHONE HCL 1 MG/ML IJ SOLN
INTRAMUSCULAR | Status: AC
  Filled 2022-01-28: qty 1

## 2022-01-28 MED ORDER — GLYCOPYRROLATE 0.2 MG/ML IJ SOLN
INTRAMUSCULAR | Status: DC | PRN
  Administered 2022-01-28: .2 mg via INTRAVENOUS

## 2022-01-28 MED ORDER — ONDANSETRON HCL 4 MG/2ML IJ SOLN: 4.0000 mg | Freq: Once | INTRAMUSCULAR | Status: DC | PRN

## 2022-01-28 MED ORDER — PHENYLEPHRINE HCL (PRESSORS) 10 MG/ML IV SOLN
INTRAVENOUS | Status: AC
Start: 2022-01-28 — End: ?
  Filled 2022-01-28: qty 1

## 2022-01-28 MED ORDER — OXYCODONE HCL 5 MG/5ML PO SOLN: 5.0000 mg | Freq: Once | ORAL | Status: AC | PRN

## 2022-01-28 MED ORDER — FENTANYL CITRATE (PF) 100 MCG/2ML IJ SOLN
INTRAMUSCULAR | Status: AC
  Filled 2022-01-28: qty 2

## 2022-01-28 MED ORDER — PHENYLEPHRINE HCL (PRESSORS) 10 MG/ML IV SOLN
INTRAVENOUS | Status: AC
  Filled 2022-01-28: qty 1

## 2022-01-28 MED ORDER — POVIDONE-IODINE 10 % EX SWAB
2.0000 | Freq: Once | CUTANEOUS | Status: AC
  Administered 2022-01-28: 2 via TOPICAL

## 2022-01-28 MED ORDER — EPHEDRINE SULFATE-NACL 50-0.9 MG/10ML-% IV SOSY
PREFILLED_SYRINGE | INTRAVENOUS | Status: DC | PRN
  Administered 2022-01-28: 5 mg via INTRAVENOUS

## 2022-01-28 MED ORDER — LIDOCAINE HCL (PF) 2 % IJ SOLN
INTRAMUSCULAR | Status: AC
  Filled 2022-01-28: qty 5

## 2022-01-28 MED ORDER — METHOCARBAMOL 1000 MG/10ML IJ SOLN
750.0000 mg | Freq: Once | INTRAVENOUS | Status: AC
  Administered 2022-01-28: 750 mg via INTRAVENOUS
  Filled 2022-01-28: qty 7.5

## 2022-01-28 MED ORDER — EPHEDRINE 5 MG/ML INJ
INTRAVENOUS | Status: AC
  Filled 2022-01-28: qty 5

## 2022-01-28 MED ORDER — ACETAMINOPHEN 500 MG PO TABS
1000.0000 mg | ORAL_TABLET | Freq: Once | ORAL | Status: AC
  Administered 2022-01-28: 1000 mg via ORAL
  Filled 2022-01-28: qty 2

## 2022-01-28 MED ORDER — MIDAZOLAM HCL 5 MG/5ML IJ SOLN
INTRAMUSCULAR | Status: DC | PRN
  Administered 2022-01-28: 2 mg via INTRAVENOUS

## 2022-01-28 MED ORDER — TRANEXAMIC ACID-NACL 1000-0.7 MG/100ML-% IV SOLN
INTRAVENOUS | Status: AC
  Filled 2022-01-28: qty 100

## 2022-01-28 MED ORDER — PROPOFOL 10 MG/ML IV BOLUS
INTRAVENOUS | Status: AC
  Filled 2022-01-28: qty 20

## 2022-01-28 MED ORDER — ONDANSETRON HCL 4 MG/2ML IJ SOLN
INTRAMUSCULAR | Status: DC | PRN
  Administered 2022-01-28: 4 mg via INTRAVENOUS

## 2022-01-28 MED ORDER — OXYCODONE HCL 5 MG PO TABS: ORAL_TABLET | ORAL | 0 refills | Status: DC

## 2022-01-28 MED ORDER — LACTATED RINGERS IV BOLUS
500.0000 mL | Freq: Once | INTRAVENOUS | Status: AC
  Administered 2022-01-28: 500 mL via INTRAVENOUS

## 2022-01-28 MED ORDER — CHLORHEXIDINE GLUCONATE 0.12 % MT SOLN
15.0000 mL | Freq: Once | OROMUCOSAL | Status: AC
  Administered 2022-01-28: 15 mL via OROMUCOSAL

## 2022-01-28 MED ORDER — SODIUM CHLORIDE 0.9 % IR SOLN
Status: DC | PRN
  Administered 2022-01-28: 1000 mL

## 2022-01-28 MED ORDER — MIDAZOLAM HCL 2 MG/2ML IJ SOLN
INTRAMUSCULAR | Status: AC
  Filled 2022-01-28: qty 2

## 2022-01-28 MED ORDER — TRANEXAMIC ACID-NACL 1000-0.7 MG/100ML-% IV SOLN: 1000.0000 mg | Freq: Once | INTRAVENOUS | Status: DC

## 2022-01-28 MED ORDER — OXYCODONE HCL 5 MG PO TABS
ORAL_TABLET | ORAL | Status: AC
  Filled 2022-01-28: qty 1

## 2022-01-28 MED ORDER — LACTATED RINGERS IV SOLN: INTRAVENOUS | Status: DC

## 2022-01-28 MED ORDER — WATER FOR IRRIGATION, STERILE IR SOLN
Status: DC | PRN
  Administered 2022-01-28: 2000 mL

## 2022-01-28 MED ORDER — CEFAZOLIN SODIUM-DEXTROSE 1-4 GM/50ML-% IV SOLN
INTRAVENOUS | Status: AC
  Filled 2022-01-28: qty 50

## 2022-01-28 MED ORDER — SODIUM CHLORIDE 0.9 % IV SOLN: INTRAVENOUS | Status: DC

## 2022-01-28 MED ORDER — ORAL CARE MOUTH RINSE: 15.0000 mL | Freq: Once | OROMUCOSAL | Status: AC

## 2022-01-28 MED ORDER — BUPIVACAINE LIPOSOME 1.3 % IJ SUSP: 20.0000 mL | Freq: Once | INTRAMUSCULAR | Status: DC

## 2022-01-28 MED ORDER — LACTATED RINGERS IV BOLUS
250.0000 mL | Freq: Once | INTRAVENOUS | Status: AC
  Administered 2022-01-28: 250 mL via INTRAVENOUS

## 2022-01-28 MED ORDER — SODIUM CHLORIDE 0.9% FLUSH
INTRAVENOUS | Status: DC | PRN
  Administered 2022-01-28: 50 mL

## 2022-01-28 MED ORDER — HYDROMORPHONE HCL 1 MG/ML IJ SOLN
INTRAMUSCULAR | Status: DC | PRN
  Administered 2022-01-28 (×4): .5 mg via INTRAVENOUS

## 2022-01-28 MED ORDER — HYDROMORPHONE HCL 1 MG/ML IJ SOLN
INTRAMUSCULAR | Status: AC
  Filled 2022-01-28: qty 2

## 2022-01-28 MED ORDER — FENTANYL CITRATE (PF) 100 MCG/2ML IJ SOLN
INTRAMUSCULAR | Status: DC | PRN
  Administered 2022-01-28 (×6): 50 ug via INTRAVENOUS

## 2022-01-28 MED ORDER — HYDROMORPHONE HCL 2 MG/ML IJ SOLN
INTRAMUSCULAR | Status: AC
  Filled 2022-01-28: qty 1

## 2022-01-28 MED ORDER — DEXMEDETOMIDINE (PRECEDEX) IN NS 20 MCG/5ML (4 MCG/ML) IV SYRINGE
PREFILLED_SYRINGE | INTRAVENOUS | Status: DC | PRN
  Administered 2022-01-28: 4 ug via INTRAVENOUS
  Administered 2022-01-28: 8 ug via INTRAVENOUS

## 2022-01-28 MED ORDER — PROPOFOL 10 MG/ML IV BOLUS
INTRAVENOUS | Status: DC | PRN
  Administered 2022-01-28: 140 mg via INTRAVENOUS

## 2022-01-28 MED ORDER — DEXAMETHASONE SODIUM PHOSPHATE 10 MG/ML IJ SOLN
INTRAMUSCULAR | Status: DC | PRN
  Administered 2022-01-28: 8 mg via INTRAVENOUS

## 2022-01-28 MED ORDER — 0.9 % SODIUM CHLORIDE (POUR BTL) OPTIME
TOPICAL | Status: DC | PRN
  Administered 2022-01-28: 1000 mL

## 2022-01-28 MED ORDER — ONDANSETRON HCL 4 MG/2ML IJ SOLN
INTRAMUSCULAR | Status: AC
  Filled 2022-01-28: qty 2

## 2022-01-28 MED ORDER — BUPIVACAINE LIPOSOME 1.3 % IJ SUSP
INTRAMUSCULAR | Status: AC
  Filled 2022-01-28: qty 20

## 2022-01-28 SURGICAL SUPPLY — 60 items
ATTUNE MED DOME PAT 38 KNEE (Knees) ×1 IMPLANT
ATTUNE PS FEM RT SZ 5 CEM KNEE (Femur) ×1 IMPLANT
ATTUNE PSRP INSR SZ5 8 KNEE (Insert) ×1 IMPLANT
BAG COUNTER SPONGE SURGICOUNT (BAG) ×3 IMPLANT
BAG DECANTER FOR FLEXI CONT (MISCELLANEOUS) ×3 IMPLANT
BAG SPEC THK2 15X12 ZIP CLS (MISCELLANEOUS) ×1
BAG SPNG CNTER NS LX DISP (BAG) ×1
BAG ZIPLOCK 12X15 (MISCELLANEOUS) ×3 IMPLANT
BASE TIBIA ATTUNE KNEE SYS SZ6 (Knees) IMPLANT
BLADE SAGITTAL 25.0X1.19X90 (BLADE) ×3 IMPLANT
BLADE SAW SGTL 13X75X1.27 (BLADE) ×3 IMPLANT
BLADE SURG 15 STRL LF DISP TIS (BLADE) ×2 IMPLANT
BLADE SURG 15 STRL SS (BLADE) ×2
BLADE SURG SZ10 CARB STEEL (BLADE) ×6 IMPLANT
BNDG CMPR MED 15X6 ELC VLCR LF (GAUZE/BANDAGES/DRESSINGS) ×1
BNDG ELASTIC 6X15 VLCR STRL LF (GAUZE/BANDAGES/DRESSINGS) ×3 IMPLANT
BOWL SMART MIX CTS (DISPOSABLE) ×3 IMPLANT
BSPLAT TIB 6 CMNT ROT PLAT STR (Knees) ×1 IMPLANT
CEMENT HV SMART SET (Cement) ×2 IMPLANT
CLSR STERI-STRIP ANTIMIC 1/2X4 (GAUZE/BANDAGES/DRESSINGS) ×6 IMPLANT
COVER SURGICAL LIGHT HANDLE (MISCELLANEOUS) ×3 IMPLANT
CUFF TOURN SGL QUICK 34 (TOURNIQUET CUFF) ×2
CUFF TRNQT CYL 34X4.125X (TOURNIQUET CUFF) ×2 IMPLANT
DRAPE INCISE IOBAN 66X45 STRL (DRAPES) ×3 IMPLANT
DRAPE U-SHAPE 47X51 STRL (DRAPES) ×3 IMPLANT
DRESSING AQUACEL AG SP 3.5X10 (GAUZE/BANDAGES/DRESSINGS) ×2 IMPLANT
DRSG AQUACEL AG SP 3.5X10 (GAUZE/BANDAGES/DRESSINGS) ×2
DURAPREP 26ML APPLICATOR (WOUND CARE) ×6 IMPLANT
ELECT REM PT RETURN 15FT ADLT (MISCELLANEOUS) ×3 IMPLANT
GLOVE BIOGEL PI IND STRL 8 (GLOVE) ×4 IMPLANT
GLOVE BIOGEL PI INDICATOR 8 (GLOVE) ×2
GLOVE SURG ORTHO 8.0 STRL STRW (GLOVE) ×3 IMPLANT
GLOVE SURG POLYISO LF SZ7.5 (GLOVE) ×3 IMPLANT
GOWN STRL REUS W/ TWL XL LVL3 (GOWN DISPOSABLE) ×4 IMPLANT
GOWN STRL REUS W/TWL XL LVL3 (GOWN DISPOSABLE) ×4
HANDPIECE INTERPULSE COAX TIP (DISPOSABLE) ×2
HOLDER FOLEY CATH W/STRAP (MISCELLANEOUS) IMPLANT
HOOD PEEL AWAY FLYTE STAYCOOL (MISCELLANEOUS) ×3 IMPLANT
IMMOBILIZER KNEE 20 (SOFTGOODS) ×2
IMMOBILIZER KNEE 20 THIGH 36 (SOFTGOODS) ×2 IMPLANT
KIT TURNOVER KIT A (KITS) IMPLANT
MANIFOLD NEPTUNE II (INSTRUMENTS) ×3 IMPLANT
NEEDLE HYPO 22GX1.5 SAFETY (NEEDLE) ×6 IMPLANT
NS IRRIG 1000ML POUR BTL (IV SOLUTION) ×3 IMPLANT
PACK TOTAL KNEE CUSTOM (KITS) ×3 IMPLANT
PROTECTOR NERVE ULNAR (MISCELLANEOUS) ×3 IMPLANT
SET HNDPC FAN SPRY TIP SCT (DISPOSABLE) ×2 IMPLANT
SPIKE FLUID TRANSFER (MISCELLANEOUS) ×6 IMPLANT
SUT ETHIBOND NAB CT1 #1 30IN (SUTURE) ×6 IMPLANT
SUT MNCRL AB 3-0 PS2 18 (SUTURE) ×3 IMPLANT
SUT VIC AB 0 CT1 36 (SUTURE) ×3 IMPLANT
SUT VIC AB 2-0 CT1 27 (SUTURE) ×4
SUT VIC AB 2-0 CT1 TAPERPNT 27 (SUTURE) ×4 IMPLANT
SYR CONTROL 10ML LL (SYRINGE) ×9 IMPLANT
TIBIA ATTUNE KNEE SYS BASE SZ6 (Knees) ×2 IMPLANT
TOWEL OR 17X26 10 PK STRL BLUE (TOWEL DISPOSABLE) ×3 IMPLANT
TRAY FOLEY MTR SLVR 16FR STAT (SET/KITS/TRAYS/PACK) ×3 IMPLANT
TUBE SUCTION HIGH CAP CLEAR NV (SUCTIONS) ×3 IMPLANT
WATER STERILE IRR 1000ML POUR (IV SOLUTION) ×6 IMPLANT
WRAP KNEE MAXI GEL POST OP (GAUZE/BANDAGES/DRESSINGS) ×3 IMPLANT

## 2022-01-28 NOTE — Op Note (Signed)
NAMELAKYN, ALSTEEN MEDICAL RECORD NO: [id number] ACCOUNT NO: [id number] DATE OF BIRTH: February 03, 1961 FACILITY: [facility] LOCATION: [facility] PHYSICIAN: W D. Carloyn Manner., MD  Operative Report   DATE OF PROCEDURE: 01/28/2022  PREOPERATIVE DIAGNOSIS:  Severe osteoarthritis, right knee with varus deformity.  POSTOPERATIVE DIAGNOSIS:  Severe osteoarthritis, right knee with varus deformity.  PROCEDURE:  Right total knee replacement (DePuy Attune cemented knee), size 5 femur, size 5 tibial bearing 8 mm thickness with a size 6 tibia, 38 mm all poly patella.  SURGEON:  W D. Carloyn Manner., MD  ASSISTANTVincent Peyer.  TOURNIQUET TIME:  55 minutes.  ANESTHESIA:  General anesthetic with adductor block.  DESCRIPTION OF PROCEDURE:  Straight skin incision with medial parapatellar approach to the knee made.  We did a 10 mm 5-degree valgus cut on the femur cutting 2-3 below the most diseased medial compartment with the extension gap, despite minimal  resection of the medial side of the knee, eventually being measured at 8 mm.  Femur was sized to be a size 5.  Placement of the all-in-1 cutting block and 3 degrees of external rotation with the anterior, posterior chamfer cuts.  Complete release of the  PCL with removal of osteophytes in the posterior aspect of the knee as well.  We infiltrated subcutaneous and capsular tissues with a mixture of Marcaine and Exparel.  Tibia was sized to be a size 6.  Placement of the keel cut for the tibia and the box  cut on the femur.  We then trialled off the femur, resecting 7.5 mm of patellar bone due to a relatively thin patella.  Excellent valgus alignment was noted with resolution of the varus deformity with full range of motion with the best stability and full  extension noted with an 8 mm bearing.  Cement was prepared on the back table, inserted tibia followed by femur, patella.  Cement was allowed to harden.  Trial bearing was removed, no excess cement was noted.  Tourniquet  was released without the bearing  in the knee.  Small bleeders were coagulated.  Final bearing was placed.  Used topical TXA as well.  Final bearing was placed.  Closure was affected with #1 Ethibond, 2-0 Vicryl and Monocryl in the skin.   NIK D: 01/28/2022 9:29:23 am T: 01/28/2022 10:15:00 am  JOB: [id number]/ [id number]

## 2022-01-28 NOTE — Evaluation (Signed)
Physical Therapy Evaluation Patient Details Name: [name] MRN: [id number] DOB: [birth year] Today's Date: 01/28/2022  History of Present Illness  Pt s/p R TKR and with hx of back surgery and L TKR  Clinical Impression  Pt s/p R TKR and presents with decreased R LE strength/ROM and post op pain limiting functional mobility.  This date, pt up to ambulate in hall and negotiated stairs with assist of significant other.  Pt and spouse reviewed don/doff KI, and HEP with written instruction provided.  Pt reports HHPT to start 01/31/22.     Recommendations for follow up therapy are one component of a multi-disciplinary discharge planning process, led by the attending physician.  Recommendations may be updated based on patient status, additional functional criteria and insurance authorization.  Follow Up Recommendations Follow physician's recommendations for discharge plan and follow up therapies      Assistance Recommended at Discharge Intermittent Supervision/Assistance  Patient can return home with the following  A little help with walking and/or transfers;A little help with bathing/dressing/bathroom;Assistance with cooking/housework;Assist for transportation;Help with stairs or ramp for entrance    Equipment Recommendations None recommended by PT  Recommendations for Other Services       Functional Status Assessment Patient has had a recent decline in their functional status and demonstrates the ability to make significant improvements in function in a reasonable and predictable amount of time.     Precautions / Restrictions Precautions Precautions: Knee;Fall Required Braces or Orthoses: Knee Immobilizer - Right Knee Immobilizer - Right: Discontinue once straight leg raise with < 10 degree lag Restrictions Weight Bearing Restrictions: No Other Position/Activity Restrictions: WBAT      Mobility  Bed Mobility Overal bed mobility: Needs Assistance Bed Mobility: Supine to Sit      Supine to sit: Min guard     General bed mobility comments: for safety only    Transfers Overall transfer level: Needs assistance Equipment used: Rolling walker (2 wheels) Transfers: Sit to/from Stand Sit to Stand: Min guard           General transfer comment: for safety only    Ambulation/Gait Ambulation/Gait assistance: Min guard Gait Distance (Feet): 100 Feet Assistive device: Rolling walker (2 wheels) Gait Pattern/deviations: Step-to pattern, Decreased step length - right, Decreased step length - left, Shuffle, Trunk flexed Gait velocity: decr     General Gait Details: cues for sequence, posture and position from RW  Stairs Stairs: Yes Stairs assistance: Min assist Stair Management: No rails, Step to pattern, Backwards, With walker Number of Stairs: 2 General stair comments: cues for sequence, spouse assisting  Wheelchair Mobility    Modified Rankin (Stroke Patients Only)       Balance Overall balance assessment: Mild deficits observed, not formally tested                                           Pertinent Vitals/Pain Pain Assessment Pain Assessment: 0-10 Pain Score: 6  Pain Location: R knee Pain Descriptors / Indicators: Aching, Sore Pain Intervention(s): Limited activity within patient's tolerance, Monitored during session, Premedicated before session    Home Living Family/patient expects to be discharged to:: Private residence Living Arrangements: Spouse/significant other Available Help at Discharge: Friend(s);Available 24 hours/day Type of Home: House Home Access: Stairs to enter Entrance Stairs-Rails: None Entrance Stairs-Number of Steps: 2   Home Layout: One level Home Equipment: Agricultural consultant (2 wheels);Cane -  single point;BSC/3in1      Prior Function Prior Level of Function : Independent/Modified Independent             Mobility Comments: ind ADLs Comments: ind     Hand Dominance         Extremity/Trunk Assessment   Upper Extremity Assessment Upper Extremity Assessment: Overall WFL for tasks assessed    Lower Extremity Assessment Lower Extremity Assessment: RLE deficits/detail RLE Deficits / Details: IND SLR, AAROM at knee -4 - 80    Cervical / Trunk Assessment Cervical / Trunk Assessment: Normal  Communication   Communication: No difficulties  Cognition Arousal/Alertness: Awake/alert Behavior During Therapy: WFL for tasks assessed/performed Overall Cognitive Status: Within Functional Limits for tasks assessed                                          General Comments      Exercises Total Joint Exercises Ankle Circles/Pumps: AROM, Both, 15 reps, Supine Quad Sets: AROM, Both, 5 reps, Supine Heel Slides: AAROM, Right, 5 reps, Supine Straight Leg Raises: AAROM, AROM, Right, 5 reps, Supine   Assessment/Plan    PT Assessment Patient needs continued PT services  PT Problem List Decreased strength;Decreased range of motion;Decreased activity tolerance;Decreased balance;Decreased mobility;Decreased knowledge of use of DME;Pain       PT Treatment Interventions DME instruction;Gait training;Stair training;Functional mobility training;Therapeutic activities;Balance training;Therapeutic exercise;Patient/family education    PT Goals (Current goals can be found in the Care Plan section)  Acute Rehab PT Goals Patient Stated Goal: Regain IND PT Goal Formulation: With patient Time For Goal Achievement: 02/04/22 Potential to Achieve Goals: Good    Frequency 7X/week     Co-evaluation               AM-PAC PT "6 Clicks" Mobility  Outcome Measure Help needed turning from your back to your side while in a flat bed without using bedrails?: A Little Help needed moving from lying on your back to sitting on the side of a flat bed without using bedrails?: A Little Help needed moving to and from a bed to a chair (including a wheelchair)?: A  Little Help needed standing up from a chair using your arms (e.g., wheelchair or bedside chair)?: A Little Help needed to walk in hospital room?: A Little Help needed climbing 3-5 steps with a railing? : A Little 6 Click Score: 18    End of Session Equipment Utilized During Treatment: Gait belt;Right knee immobilizer Activity Tolerance: Patient tolerated treatment well Patient left: in chair;with call bell/phone within reach;with family/visitor present;with nursing/sitter in room Nurse Communication: Mobility status PT Visit Diagnosis: Difficulty in walking, not elsewhere classified (R26.2)    Time: 1256-1330 PT Time Calculation (min) (ACUTE ONLY): 34 min   Charges:   PT Evaluation $PT Eval Low Complexity: 1 Low PT Treatments $Gait Training: 8-22 mins        Mauro Kaufmann PT Acute Rehabilitation Services Pager [phone] Office [phone]   Demarie Uhlig 01/28/2022, 1:47 PM

## 2022-01-28 NOTE — Discharge Instructions (Signed)

## 2022-01-28 NOTE — Interval H&P Note (Signed)
History and Physical Interval Note:  01/28/2022 7:34 AM  [name]  has presented today for surgery, with the diagnosis of OA RIGHT KNEE.  The various methods of treatment have been discussed with the patient and family. After consideration of risks, benefits and other options for treatment, the patient has consented to  Procedure(s): TOTAL KNEE ARTHROPLASTY (Right) as a surgical intervention.  The patient's history has been reviewed, patient examined, no change in status, stable for surgery.  I have reviewed the patient's chart and labs.  Questions were answered to the patient's satisfaction.     Thera Flake

## 2022-01-28 NOTE — Anesthesia Procedure Notes (Signed)
Procedure Name: LMA Insertion Date/Time: 01/28/2022 7:43 AM  Performed by: Elisabeth Cara, CRNAPre-anesthesia Checklist: Patient identified, Emergency Drugs available, Suction available, Patient being monitored and Timeout performed Patient Re-evaluated:Patient Re-evaluated prior to induction Oxygen Delivery Method: Circle system utilized Preoxygenation: Pre-oxygenation with 100% oxygen Induction Type: IV induction LMA: LMA with gastric port inserted LMA Size: 4.0 Number of attempts: 1 Placement Confirmation: positive ETCO2 and breath sounds checked- equal and bilateral Tube secured with: Tape Dental Injury: Teeth and Oropharynx as per pre-operative assessment

## 2022-01-28 NOTE — Transfer of Care (Signed)
Immediate Anesthesia Transfer of Care Note  Patient: [name]  Procedure(s) Performed: TOTAL KNEE ARTHROPLASTY (Right: Knee)  Patient Location: PACU  Anesthesia Type:General  Level of Consciousness: awake, alert , oriented and patient cooperative  Airway & Oxygen Therapy: Patient Spontanous Breathing and Patient connected to face mask oxygen  Post-op Assessment: Report given to RN, Post -op Vital signs reviewed and stable and Patient moving all extremities  Post vital signs: Reviewed and stable  Last Vitals:  Vitals Value Taken Time  BP 141/77 01/28/22 0956  Temp    Pulse 91 01/28/22 0958  Resp 14 01/28/22 0958  SpO2 100 % 01/28/22 0958  Vitals shown include unvalidated device data.  Last Pain:  Vitals:   01/28/22 0553  TempSrc: Oral  PainSc:          Complications: No notable events documented.

## 2022-01-28 NOTE — Anesthesia Postprocedure Evaluation (Signed)
Anesthesia Post Note  Patient: [name]  Procedure(s) Performed: TOTAL KNEE ARTHROPLASTY (Right: Knee)     Patient location during evaluation: PACU Anesthesia Type: General Level of consciousness: awake and alert and oriented Pain management: pain level controlled Vital Signs Assessment: post-procedure vital signs reviewed and stable Respiratory status: spontaneous breathing, nonlabored ventilation and respiratory function stable Cardiovascular status: blood pressure returned to baseline and stable Postop Assessment: no apparent nausea or vomiting Anesthetic complications: no   No notable events documented.  Last Vitals:  Vitals:   01/28/22 1115 01/28/22 1130  BP: 117/67 118/67  Pulse: 63 (!) 51  Resp: 11 11  Temp:  (!) 36.3 C  SpO2: 100% 100%    Last Pain:  Vitals:   01/28/22 1130  TempSrc:   PainSc: Asleep                 Manasvi Dickard A.

## 2022-01-28 NOTE — Anesthesia Procedure Notes (Signed)
Anesthesia Regional Block: Adductor canal block   Pre-Anesthetic Checklist: , timeout performed,  Correct Patient, Correct Site, Correct Laterality,  Correct Procedure, Correct Position, site marked,  Risks and benefits discussed,  Surgical consent,  Pre-op evaluation,  At surgeon's request and post-op pain management  Laterality: Right  Prep: chloraprep       Needles:  Injection technique: Single-shot  Needle Type: Echogenic Stimulator Needle     Needle Length: 10cm  Needle Gauge: 21   Needle insertion depth: 7 cm   Additional Needles:   Procedures:,,,, ultrasound used (permanent image in chart),,    Narrative:  Start time: 01/28/2022 7:00 AM End time: 01/28/2022 7:05 AM Injection made incrementally with aspirations every 5 mL.  Performed by: Personally  Anesthesiologist: Mal Amabile, MD  Additional Notes: Timeout performed. Patient sedated. Relevant anatomy ID'd using [place]. Incremental 2-62ml injection of LA with frequent aspiration. Patient tolerated procedure well.     Right Adductor Canal Block

## 2022-01-28 NOTE — Brief Op Note (Signed)
01/28/2022  9:55 AM  PATIENT:  [name]  61 y.o. female  PRE-OPERATIVE DIAGNOSIS:  OA RIGHT KNEE  POST-OPERATIVE DIAGNOSIS:  OA RIGHT KNEE  PROCEDURE:  Procedure(s): TOTAL KNEE ARTHROPLASTY (Right)  SURGEON:  Surgeon(s) and Role:    Frederico Hamman, MD - Primary  PHYSICIAN ASSISTANT: Margart Sickles, PA-C  ASSISTANTS: OR staff x1   ANESTHESIA:   local, regional, and general  EBL:  50 mL   BLOOD ADMINISTERED:none  DRAINS: none   LOCAL MEDICATIONS USED:  MARCAINE     SPECIMEN:  No Specimen  DISPOSITION OF SPECIMEN:  N/A  COUNTS:  YES  TOURNIQUET:   Total Tourniquet Time Documented: Thigh (Right) - 56 minutes Total: Thigh (Right) - 56 minutes   DICTATION: .Other Dictation: Dictation Number unknown  PLAN OF CARE: Discharge to home after PACU  PATIENT DISPOSITION:  PACU - hemodynamically stable.   Delay start of Pharmacological VTE agent (>24hrs) due to surgical blood loss or risk of bleeding: yes

## 2022-01-31 ENCOUNTER — Encounter (HOSPITAL_COMMUNITY): Payer: Self-pay | Admitting: Orthopedic Surgery

## 2022-02-06 DIAGNOSIS — M25561 Pain in right knee: Secondary | ICD-10-CM | POA: Diagnosis not present

## 2022-02-06 DIAGNOSIS — M6281 Muscle weakness (generalized): Secondary | ICD-10-CM | POA: Diagnosis not present

## 2022-02-06 DIAGNOSIS — Z471 Aftercare following joint replacement surgery: Secondary | ICD-10-CM | POA: Diagnosis not present

## 2022-02-09 DIAGNOSIS — Z471 Aftercare following joint replacement surgery: Secondary | ICD-10-CM | POA: Diagnosis not present

## 2022-02-09 DIAGNOSIS — M25561 Pain in right knee: Secondary | ICD-10-CM | POA: Diagnosis not present

## 2022-02-09 DIAGNOSIS — M6281 Muscle weakness (generalized): Secondary | ICD-10-CM | POA: Diagnosis not present

## 2022-02-11 DIAGNOSIS — M1711 Unilateral primary osteoarthritis, right knee: Secondary | ICD-10-CM | POA: Diagnosis not present

## 2022-02-15 DIAGNOSIS — M25561 Pain in right knee: Secondary | ICD-10-CM | POA: Diagnosis not present

## 2022-02-15 DIAGNOSIS — M6281 Muscle weakness (generalized): Secondary | ICD-10-CM | POA: Diagnosis not present

## 2022-02-15 DIAGNOSIS — Z471 Aftercare following joint replacement surgery: Secondary | ICD-10-CM | POA: Diagnosis not present

## 2022-02-17 DIAGNOSIS — M25561 Pain in right knee: Secondary | ICD-10-CM | POA: Diagnosis not present

## 2022-02-17 DIAGNOSIS — M6281 Muscle weakness (generalized): Secondary | ICD-10-CM | POA: Diagnosis not present

## 2022-02-17 DIAGNOSIS — Z471 Aftercare following joint replacement surgery: Secondary | ICD-10-CM | POA: Diagnosis not present

## 2022-02-21 ENCOUNTER — Ambulatory Visit (INDEPENDENT_AMBULATORY_CARE_PROVIDER_SITE_OTHER): Payer: Medicare HMO

## 2022-02-21 DIAGNOSIS — J4550 Severe persistent asthma, uncomplicated: Secondary | ICD-10-CM | POA: Diagnosis not present

## 2022-02-21 NOTE — Progress Notes (Signed)
Immunotherapy   Patient Details  Name: [name] MRN: [id number] Date of Birth: June 24, 1961  02/21/2022  [name] started injections for  [place] for home administration.  Following schedule: Every four weeks. Frequency:Every four weeks.  Epi-Pen: Not needed.  Consent signed previously and patient instructions given on how to administer. Patient was taught and showed with teaching syringe how to inject herself in the appropriate places. Patient showed me how she would inject herself with the teaching syringe before using the actual syringe and administered her injection successfully. Patient stated that she would wait a few minutes as she needed to rest her legs as she recently had knee surgery. I called and spoke to patient and she verbalized the injection site was fine.    Ralene Muskrat 02/21/2022, 10:56 AM

## 2022-02-22 DIAGNOSIS — M6281 Muscle weakness (generalized): Secondary | ICD-10-CM | POA: Diagnosis not present

## 2022-02-22 DIAGNOSIS — Z471 Aftercare following joint replacement surgery: Secondary | ICD-10-CM | POA: Diagnosis not present

## 2022-02-22 DIAGNOSIS — M25561 Pain in right knee: Secondary | ICD-10-CM | POA: Diagnosis not present

## 2022-02-25 DIAGNOSIS — M4326 Fusion of spine, lumbar region: Secondary | ICD-10-CM | POA: Diagnosis not present

## 2022-02-25 DIAGNOSIS — M25561 Pain in right knee: Secondary | ICD-10-CM | POA: Diagnosis not present

## 2022-02-25 DIAGNOSIS — M6281 Muscle weakness (generalized): Secondary | ICD-10-CM | POA: Diagnosis not present

## 2022-02-25 DIAGNOSIS — G894 Chronic pain syndrome: Secondary | ICD-10-CM | POA: Diagnosis not present

## 2022-02-25 DIAGNOSIS — Z471 Aftercare following joint replacement surgery: Secondary | ICD-10-CM | POA: Diagnosis not present

## 2022-03-01 DIAGNOSIS — M25561 Pain in right knee: Secondary | ICD-10-CM | POA: Diagnosis not present

## 2022-03-01 DIAGNOSIS — M6281 Muscle weakness (generalized): Secondary | ICD-10-CM | POA: Diagnosis not present

## 2022-03-01 DIAGNOSIS — Z471 Aftercare following joint replacement surgery: Secondary | ICD-10-CM | POA: Diagnosis not present

## 2022-03-03 DIAGNOSIS — Z471 Aftercare following joint replacement surgery: Secondary | ICD-10-CM | POA: Diagnosis not present

## 2022-03-03 DIAGNOSIS — M6281 Muscle weakness (generalized): Secondary | ICD-10-CM | POA: Diagnosis not present

## 2022-03-03 DIAGNOSIS — M25561 Pain in right knee: Secondary | ICD-10-CM | POA: Diagnosis not present

## 2022-03-04 DIAGNOSIS — M1711 Unilateral primary osteoarthritis, right knee: Secondary | ICD-10-CM | POA: Diagnosis not present

## 2022-03-06 DIAGNOSIS — R197 Diarrhea, unspecified: Secondary | ICD-10-CM | POA: Diagnosis not present

## 2022-03-06 DIAGNOSIS — R109 Unspecified abdominal pain: Secondary | ICD-10-CM | POA: Diagnosis not present

## 2022-03-06 DIAGNOSIS — K529 Noninfective gastroenteritis and colitis, unspecified: Secondary | ICD-10-CM | POA: Diagnosis not present

## 2022-03-06 DIAGNOSIS — R1110 Vomiting, unspecified: Secondary | ICD-10-CM | POA: Diagnosis not present

## 2022-03-06 DIAGNOSIS — K5730 Diverticulosis of large intestine without perforation or abscess without bleeding: Secondary | ICD-10-CM | POA: Diagnosis not present

## 2022-03-06 DIAGNOSIS — R1013 Epigastric pain: Secondary | ICD-10-CM | POA: Diagnosis not present

## 2022-03-06 DIAGNOSIS — N281 Cyst of kidney, acquired: Secondary | ICD-10-CM | POA: Diagnosis not present

## 2022-03-09 ENCOUNTER — Encounter: Payer: Self-pay | Admitting: Allergy & Immunology

## 2022-03-09 ENCOUNTER — Ambulatory Visit: Payer: Medicare HMO | Admitting: Allergy & Immunology

## 2022-03-09 VITALS — BP 106/78 | HR 81 | Temp 98.0°F | Resp 20 | Ht 66.5 in | Wt 174.0 lb

## 2022-03-09 DIAGNOSIS — J3089 Other allergic rhinitis: Secondary | ICD-10-CM | POA: Diagnosis not present

## 2022-03-09 DIAGNOSIS — J302 Other seasonal allergic rhinitis: Secondary | ICD-10-CM

## 2022-03-09 DIAGNOSIS — J4550 Severe persistent asthma, uncomplicated: Secondary | ICD-10-CM | POA: Diagnosis not present

## 2022-03-09 NOTE — Progress Notes (Signed)
FOLLOW UP  Date of Service/Encounter:  03/09/22   Assessment:   Moderate persistent asthma - doing very well on Faserna today   Seasonal and perennial allergic rhinitis (indoor and outdoor molds, cat, dog, cockroach)   Chronic back pain - with a history of scoliosis (rods placement in 2010) and now on disability    History of ground glass opacities in 2016 - repeat chest CT markedly improved   Recent COVID-19 infection in August 2022   S/p bilateral knee replacement     Plan/Recommendations:   1. Moderate persistent asthma, uncomplicated - Lung testing looked AMAZING today!  - I think we are on a good course from a pulmonary perspective at least.  - Let us know if you want to get the shots in our office again (we can pull strings and make that happen).  - Daily controller medication(s): Symbicort 160/4.68mcg two puffs once daily with spacer in the morning and Fasenra every 8 weeks and Singulair daily - Prior to physical activity: albuterol 2 puffs 10-15 minutes before physical activity. - Rescue medications: albuterol 4 puffs every 4-6 hours as needed or albuterol nebulizer one vial every 4-6 hours as needed - Changes during respiratory infections or worsening symptoms: Increase Symbicort to two puffs TWICE DAILY for one to two weeks. - Asthma control goals:  * Full participation in all desired activities (may need albuterol before activity) * Albuterol use two time or less a week on average (not counting use with activity) * Cough interfering with sleep two time or less a month * Oral steroids no more than once a year * No hospitalizations  2. Chronic rhinitis (weeds, indoor molds, outdoor molds, cat, dog, and cockroach) - Continue with fluticasone nasal spray 1-2 sprays per nostril daily. - Continue with Astelin 2 sprays per nostril up to twice daily as needed.  - Continue with cetirizine 10mg  daily. - Continue with montelukast 10mg  daily.   3. Return in about 6 months  (around 09/07/2022).    Subjective:   [name] is a 61 y.o. female presenting today for follow up of  Chief Complaint  Patient presents with   Asthma    No issues   Allergic Rhinitis     Still has some symptoms going on.     [name] has a history of the following: Patient Active Problem List   Diagnosis Date Noted   GERD (gastroesophageal reflux disease)    Positive colorectal cancer screening using Cologuard test    Esophageal dysphagia    Varicose veins of bilateral lower extremities with pain 01/02/2020   Seasonal and perennial allergic rhinitis 04/15/2018   Fusion of spine, thoracolumbar region 11/29/2016   Chronic insomnia 11/29/2016   Eosinophilia 03/29/2016   Moderate persistent asthma, uncomplicated 03/29/2016   Mixed rhinitis 03/29/2016   Other social stressor 03/29/2016   Chronic migraine w/o aura w/o status migrainosus, not intractable 03/14/2016   Arthritis of knee, degenerative 07/09/2015   Chronic low back pain 04/15/2015   Cough variant asthma 08/09/2014   Pulmonary infiltrates with eosinophilia (HCC) 08/09/2014    History obtained from: chart review and patient.  [name] is a 61 y.o. female presenting for a follow up visit.  She was last seen in March 2023.  At that time, her lung testing looked great.  We continue with Symbicort 160 mcg 2 puffs once daily as well as Fasenra every 8 weeks.  For her rhinitis, would continue with Flonase as well as Astelin and cetirizine.  In the interim, she has had bilateral knee replacements. She had one done August 4th and another on April 15th. She is getting around well. The right knee is slightly weaker, but she is doing physical therapy twice weekly.   Asthma/Respiratory Symptom History: Breathing has been good. She remains on her Berna Bue. She has not had issues with that. She is getting free drug. They want her to start doing this at home now. She gave herself a shot, but she is going to try this.  She is slightly  squeamish about. She has not gotten prednisone for breathing in years which is good news. She was sick with the flu last time that she had prednisone. She remains on her Symbicort two puffs once daily. This is covered well.   Allergic Rhinitis Symptom History: She remains on the fluticasone as well as the azelastine.  Overall, her symptoms are under good control.  She is also on montelukast as well as the cetirizine.  She has not had any antibiotics for sinus infections at all.  Overall, she has done very well.  She was in the hospital recently for gastroenteritis. She was sick for several days and actually needed IVF. Her boyfriend did not catch it.   Her boyfriend is having his ostomy reversed soon. They are hoping that he is going to come home without a bag. He remains on the treatments for stage IV colorectal cancer.   Otherwise, there have been no changes to her past medical history, surgical history, family history, or social history.    Review of Systems  Constitutional: Negative.  Negative for fever, malaise/fatigue and weight loss.  HENT: Negative.  Negative for congestion, ear discharge and ear pain.   Eyes:  Negative for pain, discharge and redness.  Respiratory:  Negative for cough, sputum production, shortness of breath and wheezing.   Cardiovascular: Negative.  Negative for chest pain and palpitations.  Gastrointestinal:  Negative for abdominal pain, constipation, diarrhea, heartburn, nausea and vomiting.  Skin: Negative.  Negative for itching and rash.  Neurological:  Negative for dizziness and headaches.  Endo/Heme/Allergies:  Negative for environmental allergies. Does not bruise/bleed easily.       Objective:   Blood pressure 106/78, pulse 81, temperature 98 F (36.7 C), resp. rate 20, height 5' 6.5" (1.689 m), weight 174 lb (78.9 kg), last menstrual period 08/30/2002, SpO2 95 %. Body mass index is 27.66 kg/m.    Physical Exam Vitals reviewed.  Constitutional:       Appearance: She is well-developed.  HENT:     Head: Normocephalic and atraumatic.     Right Ear: Tympanic membrane, ear canal and external ear normal.     Left Ear: Tympanic membrane, ear canal and external ear normal.     Nose: No nasal deformity, septal deviation, mucosal edema or rhinorrhea.     Right Turbinates: Enlarged, swollen and pale.     Left Turbinates: Enlarged, swollen and pale.     Right Sinus: No maxillary sinus tenderness or frontal sinus tenderness.     Left Sinus: No maxillary sinus tenderness or frontal sinus tenderness.     Comments: No nasal polyps.    Mouth/Throat:     Mouth: Mucous membranes are not pale and not dry.     Pharynx: Uvula midline.  Eyes:     General: Lids are normal. Allergic shiner present.        Right eye: No discharge.        Left eye: No discharge.  Conjunctiva/sclera: Conjunctivae normal.     Right eye: Right conjunctiva is not injected. No chemosis.    Left eye: Left conjunctiva is not injected. No chemosis.    Pupils: Pupils are equal, round, and reactive to light.  Cardiovascular:     Rate and Rhythm: Normal rate and regular rhythm.     Heart sounds: Normal heart sounds.  Pulmonary:     Effort: Pulmonary effort is normal. No tachypnea, accessory muscle usage or respiratory distress.     Breath sounds: Normal breath sounds. No wheezing, rhonchi or rales.     Comments: Moving air well in all lung fields.  No increased work of breathing. Chest:     Chest wall: No tenderness.  Musculoskeletal:     Comments: Scars over the bilateral knees.  Ambulating without a cane.  Lymphadenopathy:     Cervical: No cervical adenopathy.  Skin:    General: Skin is warm.     Capillary Refill: Capillary refill takes less than 2 seconds.     Coloration: Skin is not pale.     Findings: No abrasion, erythema, petechiae or rash. Rash is not papular, urticarial or vesicular.     Comments: No eczematous or urticarial lesions noted.  Neurological:      Mental Status: She is alert.  Psychiatric:        Behavior: Behavior is cooperative.      Diagnostic studies:    Spirometry: results normal (FEV1: 2.27/82%, FVC: 2.94/83%, FEV1/FVC: 77%).    Spirometry consistent with normal pattern.   Allergy Studies: none       Malachi Bonds, MD  [facility]

## 2022-03-09 NOTE — Patient Instructions (Addendum)
1. Moderate persistent asthma, uncomplicated - Lung testing looked AMAZING today!  - I think we are on a good course from a pulmonary perspective at least.  - Let us know if you want to get the shots in our office again (we can pull strings and make that happen).  - Daily controller medication(s): Symbicort 160/4.45mcg two puffs once daily with spacer in the morning and Fasenra every 8 weeks and Singulair daily - Prior to physical activity: albuterol 2 puffs 10-15 minutes before physical activity. - Rescue medications: albuterol 4 puffs every 4-6 hours as needed or albuterol nebulizer one vial every 4-6 hours as needed - Changes during respiratory infections or worsening symptoms: Increase Symbicort to two puffs TWICE DAILY for one to two weeks. - Asthma control goals:  * Full participation in all desired activities (may need albuterol before activity) * Albuterol use two time or less a week on average (not counting use with activity) * Cough interfering with sleep two time or less a month * Oral steroids no more than once a year * No hospitalizations  2. Chronic rhinitis (weeds, indoor molds, outdoor molds, cat, dog, and cockroach) - Continue with fluticasone nasal spray 1-2 sprays per nostril daily. - Continue with Astelin 2 sprays per nostril up to twice daily as needed.  - Continue with cetirizine 10mg  daily. - Continue with montelukast 10mg  daily.   3. Return in about 6 months (around 09/07/2022).    Please inform of any Emergency Department visits, hospitalizations, or changes in symptoms. Call 09/09/2022 before going to the ED for breathing or allergy symptoms since we might be able to fit you in for a sick visit. Feel free to contact us anytime with any questions, problems, or concerns.  It was a pleasure to see you again today!  Websites that have reliable patient information: 1. American Academy of Asthma, Allergy, and Immunology: www.aaaai.org 2. Food Allergy Research and Education  (FARE): foodallergy.org 3. Mothers of Asthmatics: http://www.asthmacommunitynetwork.org 4. American College of Allergy, Asthma, and Immunology: www.acaai.org   COVID-19 Vaccine Information can be found at: [place] For questions related to vaccine distribution or appointments, please email [email or web address] or call [phone].   We realize that you might be concerned about having an allergic reaction to the COVID19 vaccines. To help with that concern, WE ARE OFFERING THE COVID19 VACCINES IN OUR OFFICE! Ask the front desk for dates!     "Like" [email or web address] on Facebook and Instagram for our latest updates!      A healthy democracy works best when [phone] participate! Make sure you are registered to vote! If you have moved or changed any of your contact information, you will need to get this updated before voting!  In some cases, you MAY be able to register to vote online: [place]

## 2022-03-15 DIAGNOSIS — M25561 Pain in right knee: Secondary | ICD-10-CM | POA: Diagnosis not present

## 2022-03-15 DIAGNOSIS — M6281 Muscle weakness (generalized): Secondary | ICD-10-CM | POA: Diagnosis not present

## 2022-03-15 DIAGNOSIS — Z471 Aftercare following joint replacement surgery: Secondary | ICD-10-CM | POA: Diagnosis not present

## 2022-03-18 DIAGNOSIS — M25561 Pain in right knee: Secondary | ICD-10-CM | POA: Diagnosis not present

## 2022-03-18 DIAGNOSIS — Z471 Aftercare following joint replacement surgery: Secondary | ICD-10-CM | POA: Diagnosis not present

## 2022-03-18 DIAGNOSIS — M6281 Muscle weakness (generalized): Secondary | ICD-10-CM | POA: Diagnosis not present

## 2022-03-22 DIAGNOSIS — M25561 Pain in right knee: Secondary | ICD-10-CM | POA: Diagnosis not present

## 2022-03-22 DIAGNOSIS — M6281 Muscle weakness (generalized): Secondary | ICD-10-CM | POA: Diagnosis not present

## 2022-03-22 DIAGNOSIS — Z471 Aftercare following joint replacement surgery: Secondary | ICD-10-CM | POA: Diagnosis not present

## 2022-03-23 ENCOUNTER — Ambulatory Visit: Payer: Medicare HMO

## 2022-03-25 DIAGNOSIS — G894 Chronic pain syndrome: Secondary | ICD-10-CM | POA: Diagnosis not present

## 2022-03-25 DIAGNOSIS — M4326 Fusion of spine, lumbar region: Secondary | ICD-10-CM | POA: Diagnosis not present

## 2022-03-29 DIAGNOSIS — M25561 Pain in right knee: Secondary | ICD-10-CM | POA: Diagnosis not present

## 2022-03-29 DIAGNOSIS — Z471 Aftercare following joint replacement surgery: Secondary | ICD-10-CM | POA: Diagnosis not present

## 2022-03-29 DIAGNOSIS — M6281 Muscle weakness (generalized): Secondary | ICD-10-CM | POA: Diagnosis not present

## 2022-04-06 ENCOUNTER — Other Ambulatory Visit: Payer: Self-pay | Admitting: Allergy & Immunology

## 2022-04-06 ENCOUNTER — Telehealth: Payer: Self-pay

## 2022-04-06 MED ORDER — PREDNISONE 10 MG PO TABS: ORAL_TABLET | ORAL | 0 refills | Status: DC

## 2022-04-06 NOTE — Telephone Encounter (Signed)
Called and informed patient. Patient stated that she does not currently have hurting legs.

## 2022-04-06 NOTE — Telephone Encounter (Signed)
I sent in a prednisone burst.  Please let the patient know.  We might need to change to [facility]sp[facility].   Is she still have the hurting legs?  Salvatore Marvel, MD [facility] [place] [facility]

## 2022-04-06 NOTE — Telephone Encounter (Signed)
Patient called stating she is having a lot of sinus drainage down her throat into her chest. It is thick and clear. She hasn't had a fever. She did her last Nucala on 03/28/22 she believes.    [facility]

## 2022-04-06 NOTE — Telephone Encounter (Signed)
[name] helped pt with allergies but nucala is not doing much but she was having side effects of legs hurting and wasn't sure if the fasenra done the side effects or not. She is doing Flonase, singulair, xyzal, and azelastine. She is worried this might settle in her chest.

## 2022-04-07 NOTE — Telephone Encounter (Signed)
Oh gotcha - so stopping the Berna Bue helped with her leg pain? Perfecto.  [name] - let's switch to [employer].  Salvatore Marvel, MD [facility] [place] [facility] [place]

## 2022-04-14 ENCOUNTER — Other Ambulatory Visit: Payer: Self-pay | Admitting: Allergy & Immunology

## 2022-04-21 ENCOUNTER — Encounter: Payer: Self-pay | Admitting: Allergy & Immunology

## 2022-04-21 DIAGNOSIS — G894 Chronic pain syndrome: Secondary | ICD-10-CM | POA: Diagnosis not present

## 2022-04-21 DIAGNOSIS — Z79899 Other long term (current) drug therapy: Secondary | ICD-10-CM | POA: Diagnosis not present

## 2022-04-21 DIAGNOSIS — Z79891 Long term (current) use of opiate analgesic: Secondary | ICD-10-CM | POA: Diagnosis not present

## 2022-04-21 DIAGNOSIS — M5106 Intervertebral disc disorders with myelopathy, lumbar region: Secondary | ICD-10-CM | POA: Diagnosis not present

## 2022-04-22 NOTE — Telephone Encounter (Signed)
L/m for patient to contact me discuss change to [facility]

## 2022-04-28 ENCOUNTER — Encounter: Payer: Self-pay | Admitting: Allergy & Immunology

## 2022-05-10 NOTE — Telephone Encounter (Signed)
Sent patient reply in my chart message. Will mail Tezspire app to patient

## 2022-05-23 ENCOUNTER — Other Ambulatory Visit: Payer: Self-pay | Admitting: Allergy & Immunology

## 2022-05-23 ENCOUNTER — Telehealth: Payer: Self-pay | Admitting: *Deleted

## 2022-05-23 DIAGNOSIS — G894 Chronic pain syndrome: Secondary | ICD-10-CM | POA: Diagnosis not present

## 2022-05-23 DIAGNOSIS — M5106 Intervertebral disc disorders with myelopathy, lumbar region: Secondary | ICD-10-CM | POA: Diagnosis not present

## 2022-05-23 NOTE — Telephone Encounter (Signed)
Patient called and l/m regarding Tezspire change and application. I called her and advised will mail same out to her

## 2022-06-17 DIAGNOSIS — M5106 Intervertebral disc disorders with myelopathy, lumbar region: Secondary | ICD-10-CM | POA: Diagnosis not present

## 2022-06-17 DIAGNOSIS — G894 Chronic pain syndrome: Secondary | ICD-10-CM | POA: Diagnosis not present

## 2022-07-03 ENCOUNTER — Other Ambulatory Visit: Payer: Self-pay | Admitting: Allergy & Immunology

## 2022-07-14 ENCOUNTER — Encounter: Payer: Self-pay | Admitting: Allergy & Immunology

## 2022-07-15 DIAGNOSIS — M1711 Unilateral primary osteoarthritis, right knee: Secondary | ICD-10-CM | POA: Diagnosis not present

## 2022-07-19 ENCOUNTER — Other Ambulatory Visit: Payer: Self-pay | Admitting: Allergy & Immunology

## 2022-07-21 DIAGNOSIS — G894 Chronic pain syndrome: Secondary | ICD-10-CM | POA: Diagnosis not present

## 2022-07-21 DIAGNOSIS — M5106 Intervertebral disc disorders with myelopathy, lumbar region: Secondary | ICD-10-CM | POA: Diagnosis not present

## 2022-08-18 DIAGNOSIS — G894 Chronic pain syndrome: Secondary | ICD-10-CM | POA: Diagnosis not present

## 2022-08-18 DIAGNOSIS — M5106 Intervertebral disc disorders with myelopathy, lumbar region: Secondary | ICD-10-CM | POA: Diagnosis not present

## 2022-08-24 ENCOUNTER — Encounter: Payer: Self-pay | Admitting: Family Medicine

## 2022-08-24 ENCOUNTER — Ambulatory Visit (INDEPENDENT_AMBULATORY_CARE_PROVIDER_SITE_OTHER): Payer: Medicare HMO | Admitting: Family Medicine

## 2022-08-24 VITALS — BP 122/80 | HR 64 | Temp 98.6°F | Ht 66.0 in | Wt 175.0 lb

## 2022-08-24 DIAGNOSIS — Z8249 Family history of ischemic heart disease and other diseases of the circulatory system: Secondary | ICD-10-CM

## 2022-08-24 DIAGNOSIS — Z636 Dependent relative needing care at home: Secondary | ICD-10-CM | POA: Diagnosis not present

## 2022-08-24 DIAGNOSIS — J4551 Severe persistent asthma with (acute) exacerbation: Secondary | ICD-10-CM | POA: Diagnosis not present

## 2022-08-24 DIAGNOSIS — Z96651 Presence of right artificial knee joint: Secondary | ICD-10-CM

## 2022-08-24 DIAGNOSIS — E782 Mixed hyperlipidemia: Secondary | ICD-10-CM

## 2022-08-24 DIAGNOSIS — Z0000 Encounter for general adult medical examination without abnormal findings: Secondary | ICD-10-CM

## 2022-08-24 DIAGNOSIS — Z0001 Encounter for general adult medical examination with abnormal findings: Secondary | ICD-10-CM

## 2022-08-24 HISTORY — DX: Mixed hyperlipidemia: E78.2

## 2022-08-24 MED ORDER — PREDNISONE 10 MG PO TABS: ORAL_TABLET | ORAL | 0 refills | Status: DC

## 2022-08-24 NOTE — Progress Notes (Signed)
[name] is a 62 y.o. female presents to office today for annual physical exam examination.    Concerns today include: 1. Wheezing She was started on a new shot for her severe asthma and apparently this caused wheezing.  She has since been discontinued on it and they are going to trial her on get a different one.  She has been utilizing her inhalers as directed.  She is worried that it will progress again.  She is under quite a bit of stress caring for her partner, who had some complications of a recent ileostomy reversal, liver issue.  Marital status: has significant other Ricky, Substance use: none Diet: typical [place], Exercise:  no structured right now Last colonoscopy: 05/19/2021 Last mammogram: 10/22/20 Last pap smear: na Refills needed today: none Immunizations needed: Immunization History  Administered Date(s) Administered   Influenza, Seasonal, Injecte, Preservative Fre 05/28/2013   Influenza,inj,Quad PF,6+ Mos 06/07/2017, 04/16/2018   Pneumococcal Conjugate-13 11/29/2017   Td 06/28/2011   Tdap 06/29/2011     Past Medical History:  Diagnosis Date   Arthritis    Spine, R knee   Asthma    Back pain    Rod in back   GERD (gastroesophageal reflux disease)    History of blood transfusion    Back surgery   hx Cough 09/03/2014   Followed in Pulmonary clinic/ [facility]/ [place] - sinus CT 09/08/2014> Mild mucosal thickening involves the paranasal sinuses  - Eos 1.2  08/06/14 - Allergy profile 11/27/14 >  Eos 1.0/ IgE  41 with neg RAST     Mixed hyperlipidemia 08/24/2022   Social History   Socioeconomic History   Marital status: Significant Other    Spouse name: Ricky   Number of children: 2   Years of education: 12   Highest education level: Not on file  Occupational History   Occupation: disabled    Comment: back  Tobacco Use   Smoking status: Never   Smokeless tobacco: Never  Vaping Use   Vaping Use: Never used  Substance and Sexual Activity    Alcohol use: No   Drug use: No   Sexual activity: Yes    Birth control/protection: Surgical  Other Topics Concern   Not on file  Social History Narrative   Lives with [name] - 65 years   [name] has end stage colon cancer   Two sons/ grandchildren   Disabled from back pain/had scoliosis. Has had back reconstruction.    Dr. Hulda Marin doctor.    Social Determinants of Health   Financial Resource Strain: Low Risk  (10/07/2021)   Overall Financial Resource Strain (CARDIA)    Difficulty of Paying Living Expenses: Not hard at all  Food Insecurity: No Food Insecurity (10/07/2021)   Hunger Vital Sign    Worried About Running Out of Food in the Last Year: Never true    Ran Out of Food in the Last Year: Never true  Transportation Needs: No Transportation Needs (10/07/2021)   PRAPARE - Hydrologist (Medical): No    Lack of Transportation (Non-Medical): No  Physical Activity: Sufficiently Active (10/07/2021)   Exercise Vital Sign    Days of Exercise per Week: 3 days    Minutes of Exercise per Session: 60 min  Stress: No Stress Concern Present (10/07/2021)   [place]    Feeling of Stress : Only a little  Social Connections: Socially Integrated (10/07/2021)   Social Connection  and Isolation Panel [NHANES]    Frequency of Communication with Friends and Family: More than three times a week    Frequency of Social Gatherings with Friends and Family: More than three times a week    Attends Religious Services: More than 4 times per year    Active Member of Clubs or Organizations: Yes    Attends Archivist Meetings: More than 4 times per year    Marital Status: Living with partner  Intimate Partner Violence: Not At Risk (10/07/2021)   Humiliation, Afraid, Rape, and Kick questionnaire    Fear of Current or Ex-Partner: No    Emotionally Abused: No    Physically Abused: No    Sexually Abused: No    Past Surgical History:  Procedure Laterality Date   ABDOMINAL HYSTERECTOMY  2004   "cysts", pelvic pain   BACK SURGERY  2010   rod   BACK SURGERY  2014   broken hardware removal   BIOPSY  05/19/2021   Procedure: BIOPSY;  Surgeon: Rogene Houston, MD;  [facility];  Service: Endoscopy;;   COLONOSCOPY WITH PROPOFOL N/A 05/19/2021   Procedure: COLONOSCOPY WITH PROPOFOL;  Surgeon: Rogene Houston, MD;  Location: [facility];  Service: Endoscopy;  Laterality: N/A;  10:10   ESOPHAGEAL DILATION N/A 05/19/2021   Procedure: ESOPHAGEAL DILATION;  Surgeon: Rogene Houston, MD;  [facility]: [facility];  Service: Endoscopy;  Laterality: N/A;   ESOPHAGOGASTRODUODENOSCOPY (EGD) WITH PROPOFOL N/A 05/19/2021   Procedure: ESOPHAGOGASTRODUODENOSCOPY (EGD) WITH PROPOFOL;  Surgeon: Rogene Houston, MD;  [facility];  Service: Endoscopy;  Laterality: N/A;   KNEE ARTHROSCOPY Right 2017   KNEE ARTHROSCOPY Left 2022   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   Procedure: TOTAL KNEE ARTHROPLASTY;  Surgeon: Earlie Server, MD;  Location: [facility];  Service: Orthopedics;  Laterality: Left;   TOTAL KNEE ARTHROPLASTY Right 01/28/2022   Procedure: TOTAL KNEE ARTHROPLASTY;  Surgeon: Earlie Server, MD;  Location: [facility];  Service: Orthopedics;  Laterality: Right;   Family History  Problem Relation Age of Onset   Emphysema Maternal Grandmother        never smoker   COPD Maternal Grandmother    Emphysema Maternal Uncle        smoked   COPD Maternal Uncle    Arthritis Mother    Diabetes Mother    Hyperlipidemia Mother    Hypertension Mother    Early death Father 33       MVA   Asthma Sister    Asthma Brother    Heart disease Maternal Grandfather     Current Outpatient Medications:    acetaminophen (TYLENOL) 500 MG tablet, Take 1,000 mg by mouth every 8 (eight) hours as needed for moderate pain., Disp: , Rfl:    albuterol (VENTOLIN HFA) 108 (90 Base) MCG/ACT inhaler, Inhale 2  puffs into the lungs every 4 (four) hours as needed for wheezing or shortness of breath., Disp: 1 each, Rfl: 2   Azelastine HCl 137 MCG/SPRAY SOLN, INSTILL 2 SPRAYS IN EACH NOSTRIL TWICE DAILY AS DIRECTED, Disp: 30 mL, Rfl: 5   benzonatate (TESSALON) 200 MG capsule, Take 200 mg by mouth 3 (three) times daily as needed for cough., Disp: , Rfl:    budesonide-formoterol (SYMBICORT) 160-4.5 MCG/ACT inhaler, Inhale 2 puffs into the lungs 2 (two) times daily. (Patient taking differently: Inhale 1 puff into the lungs 2 (two) times daily.), Disp: 10.2 g, Rfl: 5   celecoxib (CELEBREX) 200 MG  capsule, Take 1 capsule (200 mg total) by mouth 2 (two) times daily., Disp: 60 capsule, Rfl: 2   EPINEPHrine 0.3 mg/0.3 mL IJ SOAJ injection, Inject 0.3 mg into the muscle as needed for anaphylaxis., Disp: , Rfl:    fluticasone (FLONASE) 50 MCG/ACT nasal spray, INSTILL 2 SPRAYS IN EACH NOSTRIL EVERY MORNING AND AT BEDTIME, Disp: 16 g, Rfl: 5   hydrOXYzine (ATARAX/VISTARIL) 50 MG tablet, TAKE 1/2 TO 1 TABLET BY MOUTH EVERY 8 HOURS AS NEEDED FOR ANXIETY, Disp: 30 tablet, Rfl: prn   levocetirizine (XYZAL) 5 MG tablet, Take 1 tablet (5 mg total) by mouth 2 (two) times daily as needed for allergies (Can take an extra dose during flare ups.). (Patient taking differently: Take 5 mg by mouth daily.), Disp: 180 tablet, Rfl: 1   lidocaine (XYLOCAINE) 5 % ointment, Apply 1 application. topically as needed., Disp: 35.44 g, Rfl: 0   montelukast (SINGULAIR) 10 MG tablet, TAKE 1 TABLET BY MOUTH AT BEDTIME, Disp: 30 tablet, Rfl: 2   morphine (MSIR) 15 MG tablet, Take 15 mg by mouth every 8 (eight) hours as needed. Back Pain, Disp: , Rfl:    Multiple Vitamins-Minerals (MULTIVITAMIN WITH MINERALS) tablet, Take 1 tablet by mouth daily., Disp: , Rfl:    oxyCODONE (OXY IR/ROXICODONE) 5 MG immediate release tablet, Take one tab po q6hrs prn breakthrough pain (Patient not taking: Reported on 03/09/2022), Disp: 40 tablet, Rfl: 0   pantoprazole  (PROTONIX) 40 MG tablet, TAKE 1 TABLET BY MOUTH DAILY, Disp: 30 tablet, Rfl: 5   predniSONE (DELTASONE) 10 MG tablet, Take two tablets ('20mg'$ ) twice daily for three days, then one tablet ('10mg'$ ) twice daily for three days, then STOP., Disp: 18 tablet, Rfl: 0   Propylene Glycol (SYSTANE BALANCE) 0.6 % SOLN, Place 1 drop into both eyes as needed (dry eyes)., Disp: , Rfl:    tiZANidine (ZANAFLEX) 4 MG tablet, Take 1 tablet (4 mg total) by mouth every 8 (eight) hours as needed for muscle spasms., Disp: 30 tablet, Rfl: 0  Current Facility-Administered Medications:    mepolizumab (NUCALA) injection 100 mg, 100 mg, Subcutaneous, Q28 days, Valentina Shaggy, MD, 100 mg at 02/21/22 1029  No Known Allergies   ROS: Review of Systems Pertinent items noted in HPI and remainder of comprehensive ROS otherwise negative.    Physical exam BP 122/80   Pulse 64   Temp 98.6 F (37 C)   Ht '5\' 6"'$  (1.676 m)   Wt 175 lb (79.4 kg)   LMP 08/30/2002 (Approximate)   SpO2 99%   BMI 28.25 kg/m  General appearance: alert, cooperative, appears stated age, and no distress Head: Normocephalic, without obvious abnormality, atraumatic Eyes: negative findings: lids and lashes normal, conjunctivae and sclerae normal, corneas clear, and pupils equal, round, reactive to light and accomodation Ears: normal TM's and external ear canals both ears Nose: Nares normal. Septum midline. Mucosa normal. No drainage or sinus tenderness. Throat: lips, mucosa, and tongue normal; teeth and gums normal Neck: no adenopathy, supple, symmetrical, trachea midline, and thyroid not enlarged, symmetric, no tenderness/mass/nodules Back: symmetric, no curvature. ROM normal. No CVA tenderness. Lungs: global expiratory wheezes. Normal WOB on room air. Heart: regular rate and rhythm, S1, S2 normal, no murmur, click, rub or gallop Abdomen: soft, mild TTP to RUQ. No rebound or guarding.  Extremities: extremities normal, atraumatic, no cyanosis or  edema Pulses: 2+ and symmetric Skin: Skin color, texture, turgor normal. No rashes or lesions Lymph nodes: Cervical, supraclavicular, and axillary nodes normal. Neurologic:  Grossly normal    08/24/2022    9:03 AM 10/07/2021    2:08 PM 08/23/2021    8:07 AM  Depression screen PHQ 2/9  Decreased Interest 0 0 0  Down, Depressed, Hopeless 0 0 0  PHQ - 2 Score 0 0 0  Altered sleeping 0 2 2  Tired, decreased energy '1 1 1  '$ Change in appetite '1 1 1  '$ Feeling bad or failure about yourself  0 0 0  Trouble concentrating 0 0 1  Moving slowly or fidgety/restless 0 0 0  Suicidal thoughts 0 0 0  PHQ-9 Score '2 4 5  '$ Difficult doing work/chores Not difficult at all Somewhat difficult Not difficult at all      08/24/2022    9:03 AM 08/23/2021    8:08 AM 04/09/2021    4:34 PM  GAD 7 : Generalized Anxiety Score  Nervous, Anxious, on Edge 1 1 0  Control/stop worrying 0 0 0  Worry too much - different things '1 1 1  '$ Trouble relaxing '1 1 2  '$ Restless 0 1 1  Easily annoyed or irritable 0 0 0  Afraid - awful might happen 0 0 0  Total GAD 7 Score '3 4 4  '$ Anxiety Difficulty Somewhat difficult Somewhat difficult Not difficult at all    Assessment/ Plan: 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   Annual physical exam  Mixed hyperlipidemia - Plan: CMP14+EGFR, Lipid Panel, TSH  Family history of early CAD  Severe persistent asthma with acute exacerbation - Plan: CBC, predniSONE (DELTASONE) 10 MG tablet  History of total knee arthroplasty, right  Caregiver stress  Check fasting labs.  Had a few candies at 3 AM but no other foods or drink  Prednisone given for what seems like a mild exacerbation of her severe asthma.  Will CC asthma and allergy as FYI.  Check CBC  Recovered well from knee replacement  Having some stress related to her partner's illness.  She is to contact me if she needs additional support  Counseled on healthy lifestyle choices, including diet (rich in fruits, vegetables and  lean meats and low in salt and simple carbohydrates) and exercise (at least 30 minutes of moderate physical activity daily).  Patient to follow up in 1 year for annual exam or sooner if needed.  Nuel Dejaynes M. Lajuana Ripple, DO

## 2022-08-24 NOTE — Patient Instructions (Signed)
Don't forget to call me with recommended labs for [name] and I'll see if I can arrange the home health nurse to get them for me.  Let me know who to send a copy of results to

## 2022-08-25 LAB — CMP14+EGFR
ALT: 24 IU/L (ref 0–32)
AST: 23 IU/L (ref 0–40)
Albumin/Globulin Ratio: 2.2 (ref 1.2–2.2)
Albumin: 4.4 g/dL (ref 3.8–4.9)
Alkaline Phosphatase: 139 IU/L — ABNORMAL HIGH (ref 44–121)
BUN/Creatinine Ratio: 16 (ref 9–23)
BUN: 13 mg/dL (ref 6–24)
Bilirubin Total: 0.6 mg/dL (ref 0.0–1.2)
CO2: 24 mmol/L (ref 20–29)
Calcium: 9.6 mg/dL (ref 8.7–10.2)
Chloride: 103 mmol/L (ref 96–106)
Creatinine, Ser: 0.8 mg/dL (ref 0.57–1.00)
Globulin, Total: 2 g/dL (ref 1.5–4.5)
Glucose: 94 mg/dL (ref 70–99)
Potassium: 4.4 mmol/L (ref 3.5–5.2)
Sodium: 143 mmol/L (ref 134–144)
Total Protein: 6.4 g/dL (ref 6.0–8.5)
eGFR: 85 mL/min/{1.73_m2} (ref >59)

## 2022-08-25 LAB — LIPID PANEL
Chol/HDL Ratio: 4 ratio (ref 0.0–4.4)
Cholesterol, Total: 171 mg/dL (ref 100–199)
HDL: 43 mg/dL (ref >39)
LDL Chol Calc (NIH): 110 mg/dL — ABNORMAL HIGH (ref 0–99)
Triglycerides: 100 mg/dL (ref 0–149)
VLDL Cholesterol Cal: 18 mg/dL (ref 5–40)

## 2022-08-25 LAB — CBC
Hematocrit: 39.8 % (ref 34.0–46.6)
Hemoglobin: 13.3 g/dL (ref 11.1–15.9)
MCH: 29.6 pg (ref 26.6–33.0)
MCHC: 33.4 g/dL (ref 31.5–35.7)
MCV: 88 fL (ref 79–97)
Platelets: 241 10*3/uL (ref 150–450)
RBC: 4.5 x10E6/uL (ref 3.77–5.28)
RDW: 13.3 % (ref 11.7–15.4)
WBC: 5.6 10*3/uL (ref 3.4–10.8)

## 2022-08-25 LAB — TSH: TSH: 0.911 u[IU]/mL (ref 0.450–4.500)

## 2022-09-07 ENCOUNTER — Ambulatory Visit (INDEPENDENT_AMBULATORY_CARE_PROVIDER_SITE_OTHER): Payer: Medicare HMO | Admitting: Allergy & Immunology

## 2022-09-07 ENCOUNTER — Encounter: Payer: Self-pay | Admitting: Allergy & Immunology

## 2022-09-07 ENCOUNTER — Other Ambulatory Visit: Payer: Self-pay

## 2022-09-07 VITALS — BP 126/70 | HR 65 | Temp 98.6°F | Resp 18 | Wt 180.1 lb

## 2022-09-07 DIAGNOSIS — J4550 Severe persistent asthma, uncomplicated: Secondary | ICD-10-CM | POA: Diagnosis not present

## 2022-09-07 DIAGNOSIS — J0100 Acute maxillary sinusitis, unspecified: Secondary | ICD-10-CM | POA: Diagnosis not present

## 2022-09-07 DIAGNOSIS — J3089 Other allergic rhinitis: Secondary | ICD-10-CM | POA: Diagnosis not present

## 2022-09-07 DIAGNOSIS — J302 Other seasonal allergic rhinitis: Secondary | ICD-10-CM

## 2022-09-07 MED ORDER — BENRALIZUMAB 30 MG/ML ~~LOC~~ SOSY
30.0000 mg | PREFILLED_SYRINGE | SUBCUTANEOUS | Status: DC
  Administered 2022-09-07: 30 mg via SUBCUTANEOUS

## 2022-09-07 MED ORDER — FLUCONAZOLE 150 MG PO TABS: 150.0000 mg | ORAL_TABLET | Freq: Once | ORAL | 0 refills | Status: AC

## 2022-09-07 MED ORDER — PREDNISONE 10 MG PO TABS: ORAL_TABLET | ORAL | 0 refills | Status: DC

## 2022-09-07 MED ORDER — PANTOPRAZOLE SODIUM 40 MG PO TBEC: 40.0000 mg | DELAYED_RELEASE_TABLET | Freq: Every day | ORAL | 5 refills | Status: DC

## 2022-09-07 MED ORDER — AMOXICILLIN-POT CLAVULANATE 875-125 MG PO TABS: 1.0000 | ORAL_TABLET | Freq: Two times a day (BID) | ORAL | 0 refills | Status: AC

## 2022-09-07 NOTE — Progress Notes (Signed)
Immunotherapy   Patient Details  Name: [name] MRN: [id number] Date of Birth: [birth year]  09/07/2022  [name] started injections for  Berna Bue Following schedule: Every twenty eight days for three injections then every sixty days there after.  Frequency:Every four weeks for three injections then every eight weeks there after.  Epi-Pen:Not needed Consent signed in office today and patient instructions given. Patient sat in room three for thirty minutes without an issue.   Julius Bowels 09/07/2022, 11:08 AM

## 2022-09-07 NOTE — Patient Instructions (Addendum)
1. Moderate persistent asthma, uncomplicated - Lung testing looks good today. - Sample of Berna Bue provided today. - We are going to get you back on track - Daily controller medication(s): Symbicort 160/4.33mg two puffs once daily with spacer in the morning and Fasenra every 8 weeks and Singulair daily - Prior to physical activity: albuterol 2 puffs 10-15 minutes before physical activity. - Rescue medications: albuterol 4 puffs every 4-6 hours as needed or albuterol nebulizer one vial every 4-6 hours as needed - Changes during respiratory infections or worsening symptoms: Increase Symbicort to two puffs TWICE DAILY for one to two weeks. - Asthma control goals:  * Full participation in all desired activities (may need albuterol before activity) * Albuterol use two time or less a week on average (not counting use with activity) * Cough interfering with sleep two time or less a month * Oral steroids no more than once a year * No hospitalizations  2. Chronic rhinitis (weeds, indoor molds, outdoor molds, cat, dog, and cockroach) - with overlying sinusitis - Continue with fluticasone nasal spray 1-2 sprays per nostril daily. - Continue with Astelin 2 sprays per nostril up to twice daily as needed.  - Continue with cetirizine '10mg'$  daily. - Continue with montelukast '10mg'$  daily.  - We are going to start Augmentin twice daily for ten days. - We are sending a prednisone burst as well, but do not start that unless the antibiotic is not doing the trick in a couple of days.   3. Return in about 3 months (around 12/08/2022).    Please inform [place]of any Emergency Department visits, hospitalizations, or changes in symptoms. Call u[place]abefore going to the ED for breathing or allergy symptoms since we might be able to fit you in for a sick visit. Feel free to contact u[place]oreaanytime with any questions, problems, or concerns.  It was a pleasure to see you again today!  Websites that have reliable patient  information: 1. American Academy of Asthma, Allergy, and Immunology: www.aaaai.org 2. Food Allergy Research and Education (FARE): foodallergy.org 3. Mothers of Asthmatics: http://www.asthmacommunitynetwork.org 4. American College of Allergy, Asthma, and Immunology: www.acaai.org   COVID-19 Vaccine Information can be found at: [email or web address] questions related to vaccine distribution or appointments, please email [email or web address] or call [phone]   We realize that you might be concerned about having an allergic reaction to the COVID19 vaccines. To help with that concern, WE ARE OFFERING THE COVID19 VACCINES IN OUR OFFICE! Ask the front desk for dates!     "Like" u[place]on Facebook and Instagram for our latest updates!      A healthy democracy works best when ANew York Life Insuranceparticipate! Make sure you are registered to vote! If you have moved or changed any of your contact information, you will need to get this updated before voting!  In some cases, you MAY be able to register to vote online: [email or web address]

## 2022-09-07 NOTE — Progress Notes (Signed)
FOLLOW UP  Date of Service/Encounter:  09/07/22   Assessment:   Moderate persistent asthma - doing very well on Faserna    Seasonal and perennial allergic rhinitis (indoor and outdoor molds, cat, dog, cockroach)   Chronic back pain - with a history of scoliosis (rods placement in 2010) and now on disability    History of ground glass opacities in 2016 - repeat chest CT markedly improved   Recent COVID-19 infection in August 2022   S/p bilateral knee replacement     Plan/Recommendations:   1. Moderate persistent asthma, uncomplicated - Lung testing looks good today. - Sample of Berna Bue provided today. - We are going to get you back on track - Daily controller medication(s): Symbicort 160/4.33mg two puffs once daily with spacer in the morning and Fasenra every 8 weeks and Singulair daily - Prior to physical activity: albuterol 2 puffs 10-15 minutes before physical activity. - Rescue medications: albuterol 4 puffs every 4-6 hours as needed or albuterol nebulizer one vial every 4-6 hours as needed - Changes during respiratory infections or worsening symptoms: Increase Symbicort to two puffs TWICE DAILY for one to two weeks. - Asthma control goals:  * Full participation in all desired activities (may need albuterol before activity) * Albuterol use two time or less a week on average (not counting use with activity) * Cough interfering with sleep two time or less a month * Oral steroids no more than once a year * No hospitalizations  2. Chronic rhinitis (weeds, indoor molds, outdoor molds, cat, dog, and cockroach) - with overlying sinusitis - Continue with fluticasone nasal spray 1-2 sprays per nostril daily. - Continue with Astelin 2 sprays per nostril up to twice daily as needed.  - Continue with cetirizine '10mg'$  daily. - Continue with montelukast '10mg'$  daily.  - We are going to start Augmentin twice daily for ten days. - We are sending a prednisone burst as well, but do not  start that unless the antibiotic is not doing the trick in a couple of days.   3. Return in about 3 months (around 12/08/2022).    Subjective:   Michelle LEIBROCKis a 62y.o. female presenting today for follow up of  Chief Complaint  Patient presents with   Allergic Rhinitis     Last Fri/Sunday she was sneezing and SNancy Fetterhad some chest congestion.     Michelle MAVEShas a history of the following: Patient Active Problem List   Diagnosis Date Noted   Family history of early CAD 08/24/2022   Mixed hyperlipidemia 08/24/2022   History of total knee arthroplasty, right 08/24/2022   Caregiver stress 08/24/2022   GERD (gastroesophageal reflux disease)    Positive colorectal cancer screening using Cologuard test    Esophageal dysphagia    Varicose veins of bilateral lower extremities with pain 01/02/2020   Seasonal and perennial allergic rhinitis 04/15/2018   Fusion of spine, thoracolumbar region 11/29/2016   Chronic insomnia 11/29/2016   Eosinophilia 03/29/2016   Moderate persistent asthma, uncomplicated [id number]  Mixed rhinitis 03/29/2016   Other social stressor 03/29/2016   Chronic migraine w/o aura w/o status migrainosus, not intractable 03/14/2016   Arthritis of knee, degenerative 07/09/2015   Chronic low back pain 04/15/2015   Cough variant asthma 08/09/2014   Pulmonary infiltrates with eosinophilia ([place] 08/09/2014    History obtained from: chart review and patient.  [name] a 62y.o. female presenting for a follow up visit.  We last saw her in  in September 2023.  At that time, her lung testing looked amazing.  We continue with Symbicort 160 mcg 2 puffs once daily and Fasenra every 8 weeks as well as Singulair.  For her rhinitis, we continue with Flonase as well as Astelin, cetirizine, and montelukast.  Since last visit, she is doing well. We did jump through some hoops to get other biologics approved because she was having the leg pain. We thought that it was from the [place].  She has both knees replaced. She was previously giving herself the [place]. We tried to change to [facility] in October 2023. We had changed her to [facility] but this was not controlling her allergies as well as Fasenra.  In the interim, she reports that she started feeling worse with sneezing a lot on Friday. She never had a fever and she had a COVID test that was negative. She had a sore throat off and on. She has a lot of drainage that has been worsening over the course of the week. She has been pursuing some over the counter treatments with minimal improvement in her symptoms.   Asthma/Respiratory Symptom History: She remains on her Symbicort one to two puffs twice daily. She has not had a biologic on board in well over 6 weeks or more. She never did get Tezspire approved through the support program. She is fine with trying Berna Bue again since this stabilized her breathing for so long.   Allergic Rhinitis Symptom History: She remains on the cetirizine and the montelukast for the most part. She is using her nasal sprays on a PRN basis. She has not been on antibiotics or prednisone in years.   GERD Symptom History: She has been on Protonix for years from Dr. Wonda Amis. He retired and she never established care with someone else there. There is no one   Her boyfriend has a lot of issues with cirrhosis and hyperammonemia. Multiple surgeries and solostomy reversals etc. She has been stressed out dealing with this.   Otherwise, there have been no changes to her past medical history, surgical history, family history, or social history.    Review of Systems  Constitutional: Negative.  Negative for chills, fever, malaise/fatigue and weight loss.  HENT: Negative.  Negative for congestion, ear discharge, ear pain and sinus pain.   Eyes:  Negative for pain, discharge and redness.  Respiratory:  Negative for cough, sputum production, shortness of breath and wheezing.   Cardiovascular: Negative.  Negative for  chest pain and palpitations.  Gastrointestinal:  Negative for abdominal pain, constipation, diarrhea, heartburn, nausea and vomiting.  Skin: Negative.  Negative for itching and rash.  Neurological:  Negative for dizziness and headaches.  Endo/Heme/Allergies:  Negative for environmental allergies. Does not bruise/bleed easily.       Objective:   Blood pressure 126/70, pulse 65, temperature 98.6 F (37 C), resp. rate 18, weight 180 lb 2 oz (81.7 kg), last menstrual period 08/30/2002, SpO2 96 %. Body mass index is 29.07 kg/m.    Physical Exam Vitals reviewed.  Constitutional:      Appearance: She is well-developed.  HENT:     Head: Normocephalic and atraumatic.     Right Ear: Tympanic membrane, ear canal and external ear normal.     Left Ear: Tympanic membrane, ear canal and external ear normal.     Nose: No nasal deformity, septal deviation, mucosal edema or rhinorrhea.     Right Turbinates: Enlarged, swollen and pale.     Left Turbinates: Enlarged, swollen and  pale.     Right Sinus: No maxillary sinus tenderness or frontal sinus tenderness.     Left Sinus: No maxillary sinus tenderness or frontal sinus tenderness.     Comments: No nasal polyps.    Mouth/Throat:     Mouth: Mucous membranes are not pale and not dry.     Pharynx: Uvula midline.  Eyes:     General: Lids are normal. Allergic shiner present.        Right eye: No discharge.        Left eye: No discharge.     Conjunctiva/sclera: Conjunctivae normal.     Right eye: Right conjunctiva is not injected. No chemosis.    Left eye: Left conjunctiva is not injected. No chemosis.    Pupils: Pupils are equal, round, and reactive to light.  Cardiovascular:     Rate and Rhythm: Normal rate and regular rhythm.     Heart sounds: Normal heart sounds.  Pulmonary:     Effort: Pulmonary effort is normal. No tachypnea, accessory muscle usage or respiratory distress.     Breath sounds: Normal breath sounds. No wheezing, rhonchi or  rales.     Comments: Moving air well in all lung fields.  No increased work of breathing. Chest:     Chest wall: No tenderness.  Musculoskeletal:     Comments: Scars over the bilateral knees.  Ambulating without a cane.  Lymphadenopathy:     Cervical: No cervical adenopathy.  Skin:    General: Skin is warm.     Capillary Refill: Capillary refill takes less than 2 seconds.     Coloration: Skin is not pale.     Findings: No abrasion, erythema, petechiae or rash. Rash is not papular, urticarial or vesicular.     Comments: No eczematous or urticarial lesions noted.  Neurological:     Mental Status: She is alert.  Psychiatric:        Behavior: Behavior is cooperative.      Diagnostic studies:    Spirometry: results normal (FEV1: 2.35/85%, FVC: 3.08/87%, FEV1/FVC: 76%).    Spirometry consistent with normal pattern.   Allergy Studies: none        Salvatore Marvel, MD  [facility]

## 2022-09-08 ENCOUNTER — Other Ambulatory Visit: Payer: Self-pay | Admitting: *Deleted

## 2022-09-08 MED ORDER — FASENRA 30 MG/ML ~~LOC~~ SOSY: 30.0000 mg | PREFILLED_SYRINGE | SUBCUTANEOUS | 6 refills | Status: DC

## 2022-09-12 ENCOUNTER — Other Ambulatory Visit: Payer: Self-pay | Admitting: Allergy & Immunology

## 2022-09-22 DIAGNOSIS — M5106 Intervertebral disc disorders with myelopathy, lumbar region: Secondary | ICD-10-CM | POA: Diagnosis not present

## 2022-09-22 DIAGNOSIS — G894 Chronic pain syndrome: Secondary | ICD-10-CM | POA: Diagnosis not present

## 2022-10-11 ENCOUNTER — Ambulatory Visit (INDEPENDENT_AMBULATORY_CARE_PROVIDER_SITE_OTHER): Payer: Medicare HMO

## 2022-10-11 VITALS — Ht 67.0 in | Wt 170.0 lb

## 2022-10-11 DIAGNOSIS — Z0000 Encounter for general adult medical examination without abnormal findings: Secondary | ICD-10-CM

## 2022-10-11 DIAGNOSIS — Z1231 Encounter for screening mammogram for malignant neoplasm of breast: Secondary | ICD-10-CM

## 2022-10-11 NOTE — Patient Instructions (Signed)
[name] , Thank you for taking time to come for your Medicare Wellness Visit. I appreciate your ongoing commitment to your health goals. Please review the following plan we discussed and let me know if I can assist you in the future.   These are the goals we discussed:  Goals      Exercise 3x per week (30 min per time)     Gym 3x per week for an hour each time After knees are feeling better (scheduled for surgery)     Prevent falls        This is a list of the screening recommended for you and due dates:  Health Maintenance  Topic Date Due   COVID-19 Vaccine (1) Never done   Mammogram  10/22/2021   Zoster (Shingles) Vaccine (1 of 2) 11/22/2022*   HIV Screening  08/25/2023*   Flu Shot  01/26/2023   Medicare Annual Wellness Visit  10/11/2023   Colon Cancer Screening  05/20/2031   Hepatitis C Screening: USPSTF Recommendation to screen - Ages 18-79 yo.  Completed   HPV Vaccine  Aged Out   DTaP/Tdap/Td vaccine  Discontinued   Pap Smear  Discontinued   Cologuard (Stool DNA test)  Discontinued  *Topic was postponed. The date shown is not the original due date.    Advanced directives: Advance directive discussed with you today. I have provided a copy for you to complete at home and have notarized. Once this is complete please bring a copy in to our office so we can scan it into your chart.   Conditions/risks identified: Aim for 30 minutes of exercise or brisk walking, 6-8 glasses of water, and 5 servings of fruits and vegetables each day.   Next appointment: Follow up in one year for your annual wellness visit.   Preventive Care 40-64 Years, Female Preventive care refers to lifestyle choices and visits with your health care provider that can promote health and wellness. What does preventive care include? A yearly physical exam. This is also called an annual well check. Dental exams once or twice a year. Routine eye exams. Ask your health care provider how often you should have  your eyes checked. Personal lifestyle choices, including: Daily care of your teeth and gums. Regular physical activity. Eating a healthy diet. Avoiding tobacco and drug use. Limiting alcohol use. Practicing safe sex. Taking low-dose aspirin daily starting at age 10. Taking vitamin and mineral supplements as recommended by your health care provider. What happens during an annual well check? The services and screenings done by your health care provider during your annual well check will depend on your age, overall health, lifestyle risk factors, and family history of disease. Counseling  Your health care provider may ask you questions about your: Alcohol use. Tobacco use. Drug use. Emotional well-being. Home and relationship well-being. Sexual activity. Eating habits. Work and work Astronomer. Method of birth control. Menstrual cycle. Pregnancy history. Screening  You may have the following tests or measurements: Height, weight, and BMI. Blood pressure. Lipid and cholesterol levels. These may be checked every 5 years, or more frequently if you are over 62 years old. Skin check. Lung cancer screening. You may have this screening every year starting at age 30 if you have a 30-pack-year history of smoking and currently smoke or have quit within the past 15 years. Fecal occult blood test (FOBT) of the stool. You may have this test every year starting at age 37. Flexible sigmoidoscopy or colonoscopy. You may have a  sigmoidoscopy every 5 years or a colonoscopy every 10 years starting at age 48. Hepatitis C blood test. Hepatitis B blood test. Sexually transmitted disease (STD) testing. Diabetes screening. This is done by checking your blood sugar (glucose) after you have not eaten for a while (fasting). You may have this done every 1-3 years. Mammogram. This may be done every 1-2 years. Talk to your health care provider about when you should start having regular mammograms. This may  depend on whether you have a family history of breast cancer. BRCA-related cancer screening. This may be done if you have a family history of breast, ovarian, tubal, or peritoneal cancers. Pelvic exam and Pap test. This may be done every 3 years starting at age 30. Starting at age 66, this may be done every 5 years if you have a Pap test in combination with an HPV test. Bone density scan. This is done to screen for osteoporosis. You may have this scan if you are at high risk for osteoporosis. Discuss your test results, treatment options, and if necessary, the need for more tests with your health care provider. Vaccines  Your health care provider may recommend certain vaccines, such as: Influenza vaccine. This is recommended every year. Tetanus, diphtheria, and acellular pertussis (Tdap, Td) vaccine. You may need a Td booster every 10 years. Zoster vaccine. You may need this after age 59. Pneumococcal 13-valent conjugate (PCV13) vaccine. You may need this if you have certain conditions and were not previously vaccinated. Pneumococcal polysaccharide (PPSV23) vaccine. You may need one or two doses if you smoke cigarettes or if you have certain conditions. Talk to your health care provider about which screenings and vaccines you need and how often you need them. This information is not intended to replace advice given to you by your health care provider. Make sure you discuss any questions you have with your health care provider. Document Released: 07/10/2015 Document Revised: 03/02/2016 Document Reviewed: 04/14/2015 Elsevier Interactive Patient Education  2017 [employer].    Fall Prevention in the Home Falls can cause injuries. They can happen to people of all ages. There are many things you can do to make your home safe and to help prevent falls. What can I do on the outside of my home? Regularly fix the edges of walkways and driveways and fix any cracks. Remove anything that might make you  trip as you walk through a door, such as a raised step or threshold. Trim any bushes or trees on the path to your home. Use bright outdoor lighting. Clear any walking paths of anything that might make someone trip, such as rocks or tools. Regularly check to see if handrails are loose or broken. Make sure that both sides of any steps have handrails. Any raised decks and porches should have guardrails on the edges. Have any leaves, snow, or ice cleared regularly. Use sand or salt on walking paths during winter. Clean up any spills in your garage right away. This includes oil or grease spills. What can I do in the bathroom? Use night lights. Install grab bars by the toilet and in the tub and shower. Do not use towel bars as grab bars. Use non-skid mats or decals in the tub or shower. If you need to sit down in the shower, use a plastic, non-slip stool. Keep the floor dry. Clean up any water that spills on the floor as soon as it happens. Remove soap buildup in the tub or shower regularly. Attach bath mats  securely with double-sided non-slip rug tape. Do not have throw rugs and other things on the floor that can make you trip. What can I do in the bedroom? Use night lights. Make sure that you have a light by your bed that is easy to reach. Do not use any sheets or blankets that are too big for your bed. They should not hang down onto the floor. Have a firm chair that has side arms. You can use this for support while you get dressed. Do not have throw rugs and other things on the floor that can make you trip. What can I do in the kitchen? Clean up any spills right away. Avoid walking on wet floors. Keep items that you use a lot in easy-to-reach places. If you need to reach something above you, use a strong step stool that has a grab bar. Keep electrical cords out of the way. Do not use floor polish or wax that makes floors slippery. If you must use wax, use non-skid floor wax. Do not have  throw rugs and other things on the floor that can make you trip. What can I do with my stairs? Do not leave any items on the stairs. Make sure that there are handrails on both sides of the stairs and use them. Fix handrails that are broken or loose. Make sure that handrails are as long as the stairways. Check any carpeting to make sure that it is firmly attached to the stairs. Fix any carpet that is loose or worn. Avoid having throw rugs at the top or bottom of the stairs. If you do have throw rugs, attach them to the floor with carpet tape. Make sure that you have a light switch at the top of the stairs and the bottom of the stairs. If you do not have them, ask someone to add them for you. What else can I do to help prevent falls? Wear shoes that: Do not have high heels. Have rubber bottoms. Are comfortable and fit you well. Are closed at the toe. Do not wear sandals. If you use a stepladder: Make sure that it is fully opened. Do not climb a closed stepladder. Make sure that both sides of the stepladder are locked into place. Ask someone to hold it for you, if possible. Clearly mark and make sure that you can see: Any grab bars or handrails. First and last steps. Where the edge of each step is. Use tools that help you move around (mobility aids) if they are needed. These include: Canes. Walkers. Scooters. Crutches. Turn on the lights when you go into a dark area. Replace any light bulbs as soon as they burn out. Set up your furniture so you have a clear path. Avoid moving your furniture around. If any of your floors are uneven, fix them. If there are any pets around you, be aware of where they are. Review your medicines with your doctor. Some medicines can make you feel dizzy. This can increase your chance of falling. Ask your doctor what other things that you can do to help prevent falls. This information is not intended to replace advice given to you by your health care provider.  Make sure you discuss any questions you have with your health care provider. Document Released: 04/09/2009 Document Revised: 11/19/2015 Document Reviewed: 07/18/2014 Elsevier Interactive Patient Education  2017 [employer].

## 2022-10-11 NOTE — Progress Notes (Signed)
Subjective:   [name] is a 62 y.o. female who presents for Medicare Annual (Subsequent) preventive examination. I connected with  [name] on 10/11/22 by a audio enabled telemedicine application and verified that I am speaking with the correct person using two identifiers.  Patient Location: Home  Provider Location: Home Office  I discussed the limitations of evaluation and management by telemedicine. The patient expressed understanding and agreed to proceed.  Review of Systems     Cardiac Risk Factors include: advanced age (>50men, >70 women)     Objective:    Today's Vitals   10/11/22 1159  Weight: 170 lb (77.1 kg)  Height: 5\' 7"  (1.702 m)   Body mass index is 26.63 kg/m.     10/11/2022   12:01 PM 01/18/2022    1:12 PM 10/08/2021    6:32 AM 10/07/2021    2:13 PM 09/27/2021   11:36 AM 05/23/2021    5:06 AM 05/17/2021   12:34 PM  Advanced Directives  Does Patient Have a Medical Advance Directive? No No No No No No No  Would patient like information on creating a medical advance directive? No - Patient declined  No - Patient declined No - Patient declined No - Patient declined No - Patient declined     Current Medications (verified) Outpatient Encounter Medications as of 10/11/2022  Medication Sig   acetaminophen (TYLENOL) 500 MG tablet Take 1,000 mg by mouth every 8 (eight) hours as needed for moderate pain.   albuterol (VENTOLIN HFA) 108 (90 Base) MCG/ACT inhaler Inhale 2 puffs into the lungs every 4 (four) hours as needed for wheezing or shortness of breath.   Azelastine HCl 137 MCG/SPRAY SOLN INSTILL 2 SPRAYS IN EACH NOSTRIL TWICE DAILY AS DIRECTED   Benralizumab (FASENRA) 30 MG/ML SOSY Inject 1 mL (30 mg total) into the skin every 8 (eight) weeks.   benzonatate (TESSALON) 200 MG capsule Take 200 mg by mouth 3 (three) times daily as needed for cough.   diclofenac (VOLTAREN) 75 MG EC tablet Take 75 mg by mouth 2 (two) times daily.   EPINEPHrine 0.3 mg/0.3 mL IJ  SOAJ injection Inject 0.3 mg into the muscle as needed for anaphylaxis.   fluticasone (FLONASE) 50 MCG/ACT nasal spray INSTILL 2 SPRAYS IN EACH NOSTRIL EVERY MORNING AND AT BEDTIME   hydrOXYzine (ATARAX/VISTARIL) 50 MG tablet TAKE 1/2 TO 1 TABLET BY MOUTH EVERY 8 HOURS AS NEEDED FOR ANXIETY   levocetirizine (XYZAL) 5 MG tablet Take 1 tablet (5 mg total) by mouth 2 (two) times daily as needed for allergies (Can take an extra dose during flare ups.). (Patient taking differently: Take 5 mg by mouth daily.)   lidocaine (XYLOCAINE) 5 % ointment Apply 1 application. topically as needed.   montelukast (SINGULAIR) 10 MG tablet TAKE 1 TABLET BY MOUTH AT BEDTIME   morphine (MSIR) 15 MG tablet Take 15 mg by mouth every 8 (eight) hours as needed. Back Pain   Multiple Vitamins-Minerals (MULTIVITAMIN WITH MINERALS) tablet Take 1 tablet by mouth daily.   pantoprazole (PROTONIX) 40 MG tablet Take 1 tablet (40 mg total) by mouth daily.   predniSONE (DELTASONE) 10 MG tablet Take two tablets (20mg ) twice daily for three days, then one tablet (10mg ) twice daily for three days, then STOP.   Propylene Glycol (SYSTANE BALANCE) 0.6 % SOLN Place 1 drop into both eyes as needed (dry eyes).   SYMBICORT 160-4.5 MCG/ACT inhaler INHALE TWO PUFFS BY MOUTH TWICE DAILY   tiZANidine (ZANAFLEX) 4  MG tablet Take 1 tablet (4 mg total) by mouth every 8 (eight) hours as needed for muscle spasms.   Facility-Administered Encounter Medications as of 10/11/2022  Medication   Benralizumab SOSY 30 mg    Allergies (verified) Patient has no known allergies.   History: Past Medical History:  Diagnosis Date   Arthritis    Spine, R knee   Asthma    Back pain    Rod in back   GERD (gastroesophageal reflux disease)    History of blood transfusion    Back surgery   hx Cough 09/03/2014   Followed in Pulmonary clinic/ [facility]/ [place] - sinus CT 09/08/2014> Mild mucosal thickening involves the paranasal sinuses  - Eos 1.2  08/06/14  - Allergy profile 11/27/14 >  Eos 1.0/ IgE  41 with neg RAST     Mixed hyperlipidemia 08/24/2022   Past Surgical History:  Procedure Laterality Date   ABDOMINAL HYSTERECTOMY  2004   "cysts", pelvic pain   BACK SURGERY  2010   rod   BACK SURGERY  2014   broken hardware removal   BIOPSY  05/19/2021   Procedure: BIOPSY;  Surgeon: Malissa Hippo, MD;  [facility]: [facility];  Service: Endoscopy;;   COLONOSCOPY WITH PROPOFOL N/A 05/19/2021   Procedure: COLONOSCOPY WITH PROPOFOL;  Surgeon: Malissa Hippo, MD;  Location: [facility];  Service: Endoscopy;  Laterality: N/A;  10:10   ESOPHAGEAL DILATION N/A 05/19/2021   Procedure: ESOPHAGEAL DILATION;  Surgeon: Malissa Hippo, MD;  [facility]: [facility];  Service: Endoscopy;  Laterality: N/A;   ESOPHAGOGASTRODUODENOSCOPY (EGD) WITH PROPOFOL N/A 05/19/2021   Procedure: ESOPHAGOGASTRODUODENOSCOPY (EGD) WITH PROPOFOL;  Surgeon: Malissa Hippo, MD;  [facility]: [facility];  Service: Endoscopy;  Laterality: N/A;   KNEE ARTHROSCOPY Right 2017   KNEE ARTHROSCOPY Left 2022   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   Procedure: TOTAL KNEE ARTHROPLASTY;  Surgeon: Frederico Hamman, MD;  Location: [facility];  Service: Orthopedics;  Laterality: Left;   TOTAL KNEE ARTHROPLASTY Right 01/28/2022   Procedure: TOTAL KNEE ARTHROPLASTY;  Surgeon: Frederico Hamman, MD;  Location: [facility];  Service: Orthopedics;  Laterality: Right;   Family History  Problem Relation Age of Onset   Emphysema Maternal Grandmother        never smoker   COPD Maternal Grandmother    Emphysema Maternal Uncle        smoked   COPD Maternal Uncle    Arthritis Mother    Diabetes Mother    Hyperlipidemia Mother    Hypertension Mother    Early death Father 79       MVA   Asthma Sister    Asthma Brother    Heart disease Maternal Grandfather    Social History   Socioeconomic History   Marital status: Significant Other    Spouse name: Clide Cliff   Number of children: 2   Years of  education: 12   Highest education level: Not on file  Occupational History   Occupation: disabled    Comment: back  Tobacco Use   Smoking status: Never   Smokeless tobacco: Never  Vaping Use   Vaping Use: Never used  Substance and Sexual Activity   Alcohol use: No   Drug use: No   Sexual activity: Yes    Birth control/protection: Surgical  Other Topics Concern   Not on file  Social History Narrative   Lives with Clide Cliff - 27 years   Clide Cliff has end stage colon cancer  Two sons/ grandchildren   Disabled from back pain/had scoliosis. Has had back reconstruction.    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    Social Determinants of Health   Financial Resource Strain: Low Risk  (10/11/2022)   Overall Financial Resource Strain (CARDIA)    Difficulty of Paying Living Expenses: Not hard at all  Food Insecurity: No Food Insecurity (10/11/2022)   Hunger Vital Sign    Worried About Running Out of Food in the Last Year: Never true    Ran Out of Food in the Last Year: Never true  Transportation Needs: No Transportation Needs (10/11/2022)   PRAPARE - Administrator, Civil Service (Medical): No    Lack of Transportation (Non-Medical): No  Physical Activity: Sufficiently Active (10/11/2022)   Exercise Vital Sign    Days of Exercise per Week: 3 days    Minutes of Exercise per Session: 60 min  Stress: No Stress Concern Present (10/11/2022)   [employer] of Occupational Health - Occupational Stress Questionnaire    Feeling of Stress : Not at all  Social Connections: Moderately Isolated (10/11/2022)   Social Connection and Isolation Panel [NHANES]    Frequency of Communication with Friends and Family: More than three times a week    Frequency of Social Gatherings with Friends and Family: More than three times a week    Attends Religious Services: More than 4 times per year    Active Member of [employer] or Organizations: No    Attends Engineer, structural: Never    Marital Status: Divorced     Tobacco Counseling Counseling given: Not Answered   Clinical Intake:  Pre-visit preparation completed: Yes  Pain : No/denies pain     Nutritional Risks: None Diabetes: No  How often do you need to have someone help you when you read instructions, pamphlets, or other written materials from your doctor or pharmacy?: 1 - Never  Diabetic?no   Interpreter Needed?: No  Information entered by :: Renie Ora, LPN   Activities of Daily Living    10/11/2022   12:01 PM 01/18/2022    1:13 PM  In your present state of health, do you have any difficulty performing the following activities:  Hearing? 0   Vision? 0   Difficulty concentrating or making decisions? 0   Walking or climbing stairs? 0   Dressing or bathing? 0   Doing errands, shopping? 0 0  Preparing Food and eating ? N   Using the Toilet? N   In the past six months, have you accidently leaked urine? N   Do you have problems with loss of bowel control? N   Managing your Medications? N   Managing your Finances? N   Housekeeping or managing your Housekeeping? N     Patient Care Team: Raliegh Ip, DO as PCP - General (Family Medicine) Jonelle Sidle, MD as PCP - Cardiology (Cardiology) Alfonse Spruce, MD as Consulting Physician (Allergy and Immunology) Malissa Hippo, MD as Consulting Physician (Gastroenterology) Frederico Hamman, MD as Consulting Physician (Orthopedic Surgery) Patricia Nettle, MD (Orthopedic Surgery)  Indicate any recent Medical Services you may have received from other than [facility] providers in the past year (date may be approximate).     Assessment:   This is a routine wellness examination for [facility].  Hearing/Vision screen Vision Screening - Comments:: Wears rx glasses - up to date with routine eye exams with  Dr.Lee   Dietary issues and exercise activities discussed: Current Exercise Habits: Home  exercise routine, Type of exercise: walking, Time (Minutes): 60, Frequency  (Times/Week): 3, Weekly Exercise (Minutes/Week): 180, Intensity: Mild, Exercise limited by: orthopedic condition(s)   Goals Addressed             This Visit's Progress    Exercise 3x per week (30 min per time)   On track    Gym 3x per week for an hour each time After knees are feeling better (scheduled for surgery)       Depression Screen    10/11/2022   12:00 PM 08/24/2022    9:03 AM 10/07/2021    2:08 PM 08/23/2021    8:07 AM 04/09/2021    4:34 PM 10/06/2020   12:39 PM 08/21/2020    9:13 AM  PHQ 2/9 Scores  PHQ - 2 Score 0 0 0 0 0 0 0  PHQ- 9 Score 0 0    Fall Risk    10/11/2022   11:59 AM 08/24/2022    9:03 AM 10/07/2021    2:03 PM 08/23/2021    8:07 AM 04/09/2021    4:34 PM  Fall Risk   Falls in the past year? 0 0 0 1 1  Number falls in past yr: 0 0 0 0 1  Injury with Fall? 0 0 0 0 0  Risk for fall due to : No Fall Risks No Fall Risks Orthopedic patient;Medication side effect History of fall(s) History of fall(s)  Follow up Falls prevention discussed Education provided Education provided;Falls prevention discussed Education provided Education provided    FALL RISK PREVENTION PERTAINING TO THE HOME:  Any stairs in or around the home? Yes  If so, are there any without handrails? No  Home free of loose throw rugs in walkways, pet beds, electrical cords, etc? Yes  Adequate lighting in your home to reduce risk of falls? Yes   ASSISTIVE DEVICES UTILIZED TO PREVENT FALLS:  Life alert? No  Use of a cane, walker or w/c? No  Grab bars in the bathroom? Yes  Shower chair or bench in shower? No  Elevated toilet seat or a handicapped toilet? No        10/11/2022   12:01 PM 10/07/2021    2:12 PM 10/04/2019   11:18 AM  6CIT Screen  What Year? 0 points 0 points   What month? 0 points 0 points   What time? 0 points 0 points 0 points  Count back from 20 0 points 0 points 0 points  Months in reverse 0 points 4 points 0 points  Repeat phrase 0 points 0 points 0  points  Total Score 0 points 4 points     Immunizations Immunization History  Administered Date(s) Administered   Influenza, Seasonal, Injecte, Preservative Fre 05/28/2013   Influenza,inj,Quad PF,6+ Mos 06/07/2017, 04/16/2018   Pneumococcal Conjugate-13 11/29/2017   Td 06/28/2011   Tdap 06/29/2011    TDAP status: Due, Education has been provided regarding the importance of this vaccine. Advised may receive this vaccine at local pharmacy or Health Dept. Aware to provide a copy of the vaccination record if obtained from local pharmacy or Health Dept. Verbalized acceptance and understanding.  Flu Vaccine status: Declined, Education has been provided regarding the importance of this vaccine but patient still declined. Advised may receive this vaccine at local pharmacy or Health Dept. Aware to provide a copy of the vaccination record if obtained from local pharmacy or Health Dept. Verbalized acceptance and understanding.  Pneumococcal vaccine status: Due, Education  has been provided regarding the importance of this vaccine. Advised may receive this vaccine at local pharmacy or Health Dept. Aware to provide a copy of the vaccination record if obtained from local pharmacy or Health Dept. Verbalized acceptance and understanding.  Covid-19 vaccine status: Completed vaccines  Qualifies for Shingles Vaccine? Yes   Zostavax completed No   Shingrix Completed?: No.    Education has been provided regarding the importance of this vaccine. Patient has been advised to call insurance company to determine out of pocket expense if they have not yet received this vaccine. Advised may also receive vaccine at local pharmacy or Health Dept. Verbalized acceptance and understanding.  Screening Tests Health Maintenance  Topic Date Due   COVID-19 Vaccine (1) Never done   MAMMOGRAM  10/22/2021   Zoster Vaccines- Shingrix (1 of 2) 11/22/2022 (Originally 01/25/1980)   HIV Screening  08/25/2023 (Originally 01/25/1976)    INFLUENZA VACCINE  01/26/2023   Medicare Annual Wellness (AWV)  10/11/2023   COLONOSCOPY (Pts 45-58yrs Insurance coverage will need to be confirmed)  05/20/2031   Hepatitis C Screening  Completed   HPV VACCINES  Aged Out   DTaP/Tdap/Td  Discontinued   PAP SMEAR-Modifier  Discontinued   Fecal DNA (Cologuard)  Discontinued    Health Maintenance  Health Maintenance Due  Topic Date Due   COVID-19 Vaccine (1) Never done   MAMMOGRAM  10/22/2021    Colorectal cancer screening: Type of screening: Colonoscopy. Completed 05/19/2021. Repeat every 10 years  Mammogram status: Ordered 10/11/2022. Pt provided with contact info and advised to call to schedule appt.   Bone Density status: Ordered not of age . Pt provided with contact info and advised to call to schedule appt.  Lung Cancer Screening: (Low Dose CT Chest recommended if Age 40-80 years, 30 pack-year currently smoking OR have quit w/in 15years.) does not qualify.   Lung Cancer Screening Referral: n/a  Additional Screening:  Hepatitis C Screening: does not qualify; Completed 11/29/2016  Vision Screening: Recommended annual ophthalmology exams for early detection of glaucoma and other disorders of the eye. Is the patient up to date with their annual eye exam?  Yes  Who is the provider or what is the name of the office in which the patient attends annual eye exams? Dr.Lee  If pt is not established with a provider, would they like to be referred to a provider to establish care? No .   Dental Screening: Recommended annual dental exams for proper oral hygiene  Community Resource Referral / Chronic Care Management: CRR required this visit?  No   CCM required this visit?  No      Plan:     I have personally reviewed and noted the following in the patient's chart:   Medical and social history Use of alcohol, tobacco or illicit drugs  Current medications and supplements including opioid prescriptions. Patient is not currently  taking opioid prescriptions. Functional ability and status Nutritional status Physical activity Advanced directives List of other physicians Hospitalizations, surgeries, and ER visits in previous 12 months Vitals Screenings to include cognitive, depression, and falls Referrals and appointments  In addition, I have reviewed and discussed with patient certain preventive protocols, quality metrics, and best practice recommendations. A written personalized care plan for preventive services as well as general preventive health recommendations were provided to patient.     Lorrene Reid, LPN   1/61/0960   Nurse Notes: Due Tdap Yevonne Aline Vaccine

## 2022-10-12 ENCOUNTER — Other Ambulatory Visit: Payer: Self-pay | Admitting: Allergy & Immunology

## 2022-10-17 ENCOUNTER — Encounter: Payer: Self-pay | Admitting: Allergy & Immunology

## 2022-10-21 DIAGNOSIS — G894 Chronic pain syndrome: Secondary | ICD-10-CM | POA: Diagnosis not present

## 2022-10-21 DIAGNOSIS — Z79891 Long term (current) use of opiate analgesic: Secondary | ICD-10-CM | POA: Diagnosis not present

## 2022-10-21 DIAGNOSIS — Z79899 Other long term (current) drug therapy: Secondary | ICD-10-CM | POA: Diagnosis not present

## 2022-10-21 DIAGNOSIS — G5602 Carpal tunnel syndrome, left upper limb: Secondary | ICD-10-CM | POA: Diagnosis not present

## 2022-11-09 ENCOUNTER — Other Ambulatory Visit: Payer: Self-pay | Admitting: Allergy & Immunology

## 2022-11-18 DIAGNOSIS — G894 Chronic pain syndrome: Secondary | ICD-10-CM | POA: Diagnosis not present

## 2022-11-18 DIAGNOSIS — M5106 Intervertebral disc disorders with myelopathy, lumbar region: Secondary | ICD-10-CM | POA: Diagnosis not present

## 2022-11-24 ENCOUNTER — Telehealth: Payer: Self-pay

## 2022-11-24 ENCOUNTER — Other Ambulatory Visit: Payer: Self-pay | Admitting: Allergy & Immunology

## 2022-11-24 NOTE — Telephone Encounter (Signed)
Received fax from [facility] - DOB verified - requesting review/completion/sign Home Infusion Form for patient to receive Tezspire injections at home.  Reviewed w/provider - requested form be forwarded/faxed to [name], Automotive engineer for review/completion.  Forwarding message to [name] as update.

## 2022-11-29 NOTE — Telephone Encounter (Signed)
Not sure this pertains to patient she was on [place] not [employer]

## 2022-12-09 ENCOUNTER — Ambulatory Visit: Payer: Medicare HMO | Admitting: Allergy & Immunology

## 2022-12-21 ENCOUNTER — Inpatient Hospital Stay: Admission: RE | Admit: 2022-12-21 | Payer: Medicare HMO | Source: Ambulatory Visit

## 2022-12-22 ENCOUNTER — Other Ambulatory Visit: Payer: Self-pay | Admitting: Allergy & Immunology

## 2022-12-23 DIAGNOSIS — M5106 Intervertebral disc disorders with myelopathy, lumbar region: Secondary | ICD-10-CM | POA: Diagnosis not present

## 2022-12-23 DIAGNOSIS — G894 Chronic pain syndrome: Secondary | ICD-10-CM | POA: Diagnosis not present

## 2022-12-26 ENCOUNTER — Encounter: Payer: Self-pay | Admitting: Family Medicine

## 2023-01-06 ENCOUNTER — Other Ambulatory Visit: Payer: Self-pay | Admitting: Allergy & Immunology

## 2023-01-11 ENCOUNTER — Other Ambulatory Visit: Payer: Self-pay | Admitting: Allergy & Immunology

## 2023-01-13 ENCOUNTER — Other Ambulatory Visit: Payer: Self-pay | Admitting: Family Medicine

## 2023-01-13 DIAGNOSIS — Z1231 Encounter for screening mammogram for malignant neoplasm of breast: Secondary | ICD-10-CM

## 2023-01-18 ENCOUNTER — Inpatient Hospital Stay: Admission: RE | Admit: 2023-01-18 | Payer: Medicare HMO | Source: Ambulatory Visit

## 2023-01-23 DIAGNOSIS — M5106 Intervertebral disc disorders with myelopathy, lumbar region: Secondary | ICD-10-CM | POA: Diagnosis not present

## 2023-01-23 DIAGNOSIS — G894 Chronic pain syndrome: Secondary | ICD-10-CM | POA: Diagnosis not present

## 2023-02-22 ENCOUNTER — Encounter: Payer: Self-pay | Admitting: Allergy & Immunology

## 2023-02-22 ENCOUNTER — Ambulatory Visit: Payer: Medicare HMO | Admitting: Allergy & Immunology

## 2023-02-22 VITALS — BP 122/72 | HR 58 | Temp 98.2°F | Resp 18 | Ht 66.5 in | Wt 173.5 lb

## 2023-02-22 DIAGNOSIS — J4550 Severe persistent asthma, uncomplicated: Secondary | ICD-10-CM

## 2023-02-22 DIAGNOSIS — R918 Other nonspecific abnormal finding of lung field: Secondary | ICD-10-CM | POA: Diagnosis not present

## 2023-02-22 DIAGNOSIS — J3089 Other allergic rhinitis: Secondary | ICD-10-CM

## 2023-02-22 DIAGNOSIS — J302 Other seasonal allergic rhinitis: Secondary | ICD-10-CM | POA: Diagnosis not present

## 2023-02-22 DIAGNOSIS — M5106 Intervertebral disc disorders with myelopathy, lumbar region: Secondary | ICD-10-CM | POA: Diagnosis not present

## 2023-02-22 DIAGNOSIS — G894 Chronic pain syndrome: Secondary | ICD-10-CM | POA: Diagnosis not present

## 2023-02-22 NOTE — Patient Instructions (Addendum)
1. Moderate persistent asthma, uncomplicated - Lung testing looks good today. - Let's avoid Tezspire for now since you are stable.  - Daily controller medication(s): Symbicort 160/4.61mcg two puffs once daily with spacer in the morning and Singulair daily - Prior to physical activity: albuterol 2 puffs 10-15 minutes before physical activity. - Rescue medications: albuterol 4 puffs every 4-6 hours as needed or albuterol nebulizer one vial every 4-6 hours as needed - Changes during respiratory infections or worsening symptoms: Increase Symbicort to two puffs TWICE DAILY for one to two weeks. - Asthma control goals:  * Full participation in all desired activities (may need albuterol before activity) * Albuterol use two time or less a week on average (not counting use with activity) * Cough interfering with sleep two time or less a month * Oral steroids no more than once a year * No hospitalizations  2. Chronic rhinitis (weeds, indoor molds, outdoor molds, cat, dog, and cockroach) - Continue with fluticasone nasal spray 1-2 sprays per nostril daily. - Continue with Astelin 2 sprays per nostril up to twice daily as needed.  - Continue with cetirizine 10mg  daily. - Continue with montelukast 10mg  daily.   3. Return in about 6 months (around 08/25/2023).    Please inform us of any Emergency Department visits, hospitalizations, or changes in symptoms. Call us before going to the ED for breathing or allergy symptoms since we might be able to fit you in for a sick visit. Feel free to contact us anytime with any questions, problems, or concerns.  It was a pleasure to see you again today!  Websites that have reliable patient information: 1. American Academy of Asthma, Allergy, and Immunology: www.aaaai.org 2. Food Allergy Research and Education (FARE): foodallergy.org 3. Mothers of Asthmatics: http://www.asthmacommunitynetwork.org 4. American College of Allergy, Asthma, and Immunology:  www.acaai.org   COVID-19 Vaccine Information can be found at: [email or web address].[email or web address] For questions related to vaccine distribution or appointments, please email [email or web address] or call [phone].   We realize that you might be concerned about having an allergic reaction to the COVID19 vaccines. To help with that concern, WE ARE OFFERING THE COVID19 VACCINES IN OUR OFFICE! Ask the front desk for dates!     "Like" [place] on Facebook and Instagram for our latest updates!      A healthy democracy works best when [employer] participate! Make sure you are registered to vote! If you have moved or changed any of your contact information, you will need to get this updated before voting!  In some cases, you MAY be able to register to vote online: [email or web address]      We are partnering with [employer] to provide home spirometry devices. These are sent to your home and connect to your smart phone. The company will reach out to you (after you scan the code) and go through the process and estimate any insurance copayments, if applicable. Then we will read your spirometry readings remotely from our office to make sure that you are heading in the right direction!

## 2023-02-22 NOTE — Progress Notes (Signed)
FOLLOW UP  Date of Service/Encounter:  02/22/23   Assessment:   Moderate persistent asthma - controlled without a biologic at this point in time   Seasonal and perennial allergic rhinitis (indoor and outdoor molds, cat, dog, cockroach)   Chronic back pain - with a history of scoliosis (rods placement in 2010) and now on disability    History of ground glass opacities in 2016 - repeat chest CT markedly improved   Recent COVID-19 infection in August 2022   S/p bilateral knee replacement   Leg pain - felt to be secondary to placenta (planning to transition to Tezspire)  Plan/Recommendations:   1. Moderate persistent asthma, uncomplicated - Lung testing looks good today. - Let's avoid Tezspire for now since you are stable.  - Daily controller medication(s): Symbicort 160/4.38mcg two puffs once daily with spacer in the morning and Singulair daily - Prior to physical activity: albuterol 2 puffs 10-15 minutes before physical activity. - Rescue medications: albuterol 4 puffs every 4-6 hours as needed or albuterol nebulizer one vial every 4-6 hours as needed - Changes during respiratory infections or worsening symptoms: Increase Symbicort to two puffs TWICE DAILY for one to two weeks. - Asthma control goals:  * Full participation in all desired activities (may need albuterol before activity) * Albuterol use two time or less a week on average (not counting use with activity) * Cough interfering with sleep two time or less a month * Oral steroids no more than once a year * No hospitalizations  2. Chronic rhinitis (weeds, indoor molds, outdoor molds, cat, dog, and cockroach) - Continue with fluticasone nasal spray 1-2 sprays per nostril daily. - Continue with Astelin 2 sprays per nostril up to twice daily as needed.  - Continue with cetirizine 10mg  daily. - Continue with montelukast 10mg  daily.   3. Return in about 6 months (around 08/25/2023).    Subjective:   [name] is a  62 y.o. female presenting today for follow up of  Chief Complaint  Patient presents with   Follow-up    Cough that comes and goes     [name] has a history of the following: Patient Active Problem List   Diagnosis Date Noted   Family history of early CAD 08/24/2022   Mixed hyperlipidemia 08/24/2022   History of total knee arthroplasty, right 08/24/2022   Caregiver stress 08/24/2022   GERD (gastroesophageal reflux disease)    Positive colorectal cancer screening using Cologuard test    Esophageal dysphagia    Varicose veins of bilateral lower extremities with pain 01/02/2020   Seasonal and perennial allergic rhinitis 04/15/2018   Fusion of spine, thoracolumbar region 11/29/2016   Chronic insomnia 11/29/2016   Eosinophilia 03/29/2016   Moderate persistent asthma, uncomplicated 03/29/2016   Mixed rhinitis 03/29/2016   Other social stressor 03/29/2016   Chronic migraine w/o aura w/o status migrainosus, not intractable 03/14/2016   Arthritis of knee, degenerative 07/09/2015   Chronic low back pain 04/15/2015   Cough variant asthma 08/09/2014   Pulmonary infiltrates with eosinophilia (HCC) 08/09/2014    History obtained from: chart review and patient.  [name] is a 62 y.o. female presenting for a follow up visit.  She was last seen in March 2024.  At that time, however testing was good.  We gave her a sample of to get her back on track.  We continue with Symbicort 160 mcg 2 puffs once daily as well as Singulair.  For her rhinitis, we will continue with  Flonase as well as Astelin, cetirizine, montelukast.  We started her on Augmentin and a prednisone burst for sinusitis.  Since last visit, she has mostly done well.  However, she has developed bilateral leg pains which she felt increased with each Fasenra injection. She made the decision to stop her Fasenra injections because of the side effects.   Asthma/Respiratory Symptom History: She remains  on her Symbicort two puffs once  daily.  This combination seems to be working pretty well.  She has some congestion in her chest intermittently. She thought that she would have been bad this morning but it is fine.  Yesterday, she was doing light yard work so she anticipated that her symptoms would be a lot worse this morning.  She was surprised when she woke up and was doing okay.  She will use the Symbicort BID when she is really bad and then back down to 2 puffs once daily.  Ariyona's asthma has been well controlled. She has not required rescue medication, experienced nocturnal awakenings due to lower respiratory symptoms, nor have activities of daily living been limited. She has required no Emergency Department or Urgent Care visits for her asthma. She has required zero courses of systemic steroids for asthma exacerbations since the last visit. ACT score today is 25, indicating excellent asthma symptom control.   She has not had prednisone for her breathing in a while.  She wants to just hold off on using the test Spiriva for now.  She feels that she is doing fairly good without it.  She just has a lot going on right now.  Allergic Rhinitis Symptom History: She did do some weed eating over the last couple of days.  This tends to make things worked. She is doing Flonase and Astelin faithfully. This is covered by her insurance. She remains cetirizine and montelukast. She does alternate between her antihistamines.  She has not been on antibiotics since we gave them to her in March.  Occasionally, she will get antibiotics from her PCP, but she has not done this since last time we saw her.  Her back pain is under fairly good control.  Her boyfriend is not doing great.  He is on palliative care for his colorectal cancer.  Evidently, they wanted to place him on hospice, but they just went for palliative care instead.   Otherwise, there have been no changes to her past medical history, surgical history, family history, or social  history.    Review of systems otherwise negative other than that mentioned in the HPI.    Objective:   Blood pressure 122/72, pulse (!) 58, temperature 98.2 F (36.8 C), resp. rate 18, height 5' 6.5" (1.689 m), weight 173 lb 8 oz (78.7 kg), last menstrual period 08/30/2002, SpO2 97%. Body mass index is 27.58 kg/m.    Physical Exam Vitals reviewed.  Constitutional:      Appearance: She is well-developed.     Comments: Talkative. Good natured.  HENT:     Head: Normocephalic and atraumatic.     Right Ear: Tympanic membrane, ear canal and external ear normal.     Left Ear: Tympanic membrane, ear canal and external ear normal.     Nose: No nasal deformity, septal deviation, mucosal edema or rhinorrhea.     Right Turbinates: Enlarged, swollen and pale.     Left Turbinates: Enlarged, swollen and pale.     Right Sinus: No maxillary sinus tenderness or frontal sinus tenderness.     Left Sinus:  No maxillary sinus tenderness or frontal sinus tenderness.     Comments: No nasal polyps.    Mouth/Throat:     Mouth: Mucous membranes are not pale and not dry.     Pharynx: Uvula midline.  Eyes:     General: Lids are normal. Allergic shiner present.        Right eye: No discharge.        Left eye: No discharge.     Conjunctiva/sclera: Conjunctivae normal.     Right eye: Right conjunctiva is not injected. No chemosis.    Left eye: Left conjunctiva is not injected. No chemosis.    Pupils: Pupils are equal, round, and reactive to light.  Cardiovascular:     Rate and Rhythm: Normal rate and regular rhythm.     Heart sounds: Normal heart sounds.  Pulmonary:     Effort: Pulmonary effort is normal. No tachypnea, accessory muscle usage or respiratory distress.     Breath sounds: Normal breath sounds. No wheezing, rhonchi or rales.     Comments: Moving air well in all lung fields.  No increased work of breathing. Chest:     Chest wall: No tenderness.  Musculoskeletal:     Comments: Scars  over the bilateral knees.  Ambulating without a cane.  Lymphadenopathy:     Cervical: No cervical adenopathy.  Skin:    General: Skin is warm.     Capillary Refill: Capillary refill takes less than 2 seconds.     Coloration: Skin is not pale.     Findings: No abrasion, erythema, petechiae or rash. Rash is not papular, urticarial or vesicular.     Comments: No eczematous or urticarial lesions noted.  Neurological:     Mental Status: She is alert.  Psychiatric:        Behavior: Behavior is cooperative.      Diagnostic studies:    Spirometry: results normal (FEV1: 2.45/92%, FVC: 3.52/104%, FEV1/FVC: 70%).    Spirometry consistent with normal pattern.   Allergy Studies: none        Malachi Bonds, MD  [facility]

## 2023-02-24 DIAGNOSIS — M170 Bilateral primary osteoarthritis of knee: Secondary | ICD-10-CM | POA: Diagnosis not present

## 2023-03-23 DIAGNOSIS — M5106 Intervertebral disc disorders with myelopathy, lumbar region: Secondary | ICD-10-CM | POA: Diagnosis not present

## 2023-03-23 DIAGNOSIS — G894 Chronic pain syndrome: Secondary | ICD-10-CM | POA: Diagnosis not present

## 2023-03-29 ENCOUNTER — Ambulatory Visit: Payer: Medicare HMO | Admitting: Family Medicine

## 2023-03-29 ENCOUNTER — Ambulatory Visit (HOSPITAL_COMMUNITY)
Admission: RE | Admit: 2023-03-29 | Discharge: 2023-03-29 | Disposition: A | Discharge status: Home / Self Care | Payer: Medicare HMO | Source: Ambulatory Visit | Attending: Family Medicine | Admitting: Family Medicine

## 2023-03-29 ENCOUNTER — Encounter: Payer: Self-pay | Admitting: Family Medicine

## 2023-03-29 VITALS — BP 118/80 | HR 60 | Temp 98.8°F | Ht 66.0 in | Wt 176.0 lb

## 2023-03-29 DIAGNOSIS — R0789 Other chest pain: Secondary | ICD-10-CM | POA: Insufficient documentation

## 2023-03-29 DIAGNOSIS — Z23 Encounter for immunization: Secondary | ICD-10-CM | POA: Diagnosis not present

## 2023-03-29 DIAGNOSIS — Z981 Arthrodesis status: Secondary | ICD-10-CM | POA: Diagnosis not present

## 2023-03-29 DIAGNOSIS — E8941 Symptomatic postprocedural ovarian failure: Secondary | ICD-10-CM

## 2023-03-29 DIAGNOSIS — R5383 Other fatigue: Secondary | ICD-10-CM

## 2023-03-29 DIAGNOSIS — Z636 Dependent relative needing care at home: Secondary | ICD-10-CM | POA: Diagnosis not present

## 2023-03-29 DIAGNOSIS — R6889 Other general symptoms and signs: Secondary | ICD-10-CM | POA: Diagnosis not present

## 2023-03-29 DIAGNOSIS — R079 Chest pain, unspecified: Secondary | ICD-10-CM | POA: Diagnosis not present

## 2023-03-29 MED ORDER — VEOZAH 45 MG PO TABS
1.0000 | ORAL_TABLET | Freq: Every day | ORAL | 3 refills | Status: DC
Start: 2023-03-29 — End: 2023-04-17

## 2023-03-29 NOTE — Progress Notes (Signed)
Subjective: CC: Chronic follow-up PCP: Raliegh Ip, DO [name] is a 62 y.o. female presenting to clinic today for:  1.  Fatigue, chest pain Patient reports she has been having intermittent episodes of chest pain.  She reports this cannot happen on any part of the chest left or right side.  She has reported some fatigue as well.  Reports no orthopnea, lower extremity edema, blood loss, shortness of breath outside of baseline.  She is compliant with her Symbicort, Protonix and allergy medications.  She does admit to being under more stress due to the worsening health of her partner [place].  He is suffering from advanced cirrhosis of the liver  2.  Hot flashes She has been suffering from a lot of hot flashes that been worse as of late.  She has history of partial hysterectomy which left only 1 ovary.  She reports no vaginal bleeding.  ROS: Per HPI  No Known Allergies Past Medical History:  Diagnosis Date   Arthritis    Spine, R knee   Asthma    Back pain    Rod in back   GERD (gastroesophageal reflux disease)    History of blood transfusion    Back surgery   hx Cough 09/03/2014   Followed in Pulmonary clinic/ [facility]/ [place] - sinus CT 09/08/2014> Mild mucosal thickening involves the paranasal sinuses  - Eos 1.2  08/06/14 - Allergy profile 11/27/14 >  Eos 1.0/ IgE  41 with neg RAST     Mixed hyperlipidemia 08/24/2022    Current Outpatient Medications:    acetaminophen (TYLENOL) 500 MG tablet, Take 1,000 mg by mouth every 8 (eight) hours as needed for moderate pain., Disp: , Rfl:    albuterol (VENTOLIN HFA) 108 (90 Base) MCG/ACT inhaler, Inhale 2 puffs into the lungs every 4 (four) hours as needed for wheezing or shortness of breath., Disp: 1 each, Rfl: 2   azelastine (ASTELIN) 0.1 % nasal spray, INSTILL 2 SPRAYS IN EACH NOSTRIL TWICE DAILY AS DIRECTED, Disp: 30 mL, Rfl: 5   Benralizumab (FASENRA) 30 MG/ML SOSY, Inject 1 mL (30 mg total) into the skin every 8  (eight) weeks., Disp: 1 mL, Rfl: 6   benzonatate (TESSALON) 200 MG capsule, Take 200 mg by mouth 3 (three) times daily as needed for cough., Disp: , Rfl:    cetirizine (ZYRTEC) 10 MG tablet, TAKE 1 TABLET BY MOUTH DAILY, Disp: 30 tablet, Rfl: 5   diclofenac (VOLTAREN) 75 MG EC tablet, Take 75 mg by mouth 2 (two) times daily., Disp: , Rfl:    EPINEPHrine 0.3 mg/0.3 mL IJ SOAJ injection, Inject 0.3 mg into the muscle as needed for anaphylaxis., Disp: , Rfl:    fluticasone (FLONASE) 50 MCG/ACT nasal spray, INSTILL 2 SPRAYS IN EACH NOSTRIL EVERY MORNING AND AT BEDTIME, Disp: 16 g, Rfl: 5   hydrOXYzine (ATARAX/VISTARIL) 50 MG tablet, TAKE 1/2 TO 1 TABLET BY MOUTH EVERY 8 HOURS AS NEEDED FOR ANXIETY, Disp: 30 tablet, Rfl: prn   lidocaine (XYLOCAINE) 5 % ointment, Apply 1 application. topically as needed., Disp: 35.44 g, Rfl: 0   montelukast (SINGULAIR) 10 MG tablet, TAKE 1 TABLET BY MOUTH AT BEDTIME, Disp: 30 tablet, Rfl: 1   morphine (MSIR) 15 MG tablet, Take 15 mg by mouth every 8 (eight) hours as needed. Back Pain, Disp: , Rfl:    Multiple Vitamins-Minerals (MULTIVITAMIN WITH MINERALS) tablet, Take 1 tablet by mouth daily., Disp: , Rfl:    pantoprazole (PROTONIX) 40 MG tablet,  Take 1 tablet (40 mg total) by mouth daily., Disp: 30 tablet, Rfl: 5   Propylene Glycol (SYSTANE BALANCE) 0.6 % SOLN, Place 1 drop into both eyes as needed (dry eyes)., Disp: , Rfl:    SYMBICORT 160-4.5 MCG/ACT inhaler, INHALE TWO PUFFS BY MOUTH TWICE DAILY, Disp: 10.2 g, Rfl: 5   tiZANidine (ZANAFLEX) 4 MG tablet, Take 1 tablet (4 mg total) by mouth every 8 (eight) hours as needed for muscle spasms., Disp: 30 tablet, Rfl: 0  Current Facility-Administered Medications:    Benralizumab SOSY 30 mg, 30 mg, Subcutaneous, Q28 days, Dellis Anes, Hetty Ely, MD, 30 mg at 09/07/22 1106 Social History   Socioeconomic History   Marital status: Significant Other    Spouse name: Ricky   Number of children: 2   Years of education: 12    Highest education level: Not on file  Occupational History   Occupation: disabled    Comment: back  Tobacco Use   Smoking status: Never   Smokeless tobacco: Never  Vaping Use   Vaping status: Never Used  Substance and Sexual Activity   Alcohol use: No   Drug use: No   Sexual activity: Yes    Birth control/protection: Surgical  Other Topics Concern   Not on file  Social History Narrative   Lives with Clide Cliff - 27 years   Clide Cliff has end stage colon cancer   Two sons/ grandchildren   Disabled from back pain/had scoliosis. Has had back reconstruction.    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    Social Determinants of Health   Financial Resource Strain: Low Risk  (10/11/2022)   Overall Financial Resource Strain (CARDIA)    Difficulty of Paying Living Expenses: Not hard at all  Food Insecurity: No Food Insecurity (10/11/2022)   Hunger Vital Sign    Worried About Running Out of Food in the Last Year: Never true    Ran Out of Food in the Last Year: Never true  Transportation Needs: No Transportation Needs (10/11/2022)   PRAPARE - Administrator, Civil Service (Medical): No    Lack of Transportation (Non-Medical): No  Physical Activity: Sufficiently Active (10/11/2022)   Exercise Vital Sign    Days of Exercise per Week: 3 days    Minutes of Exercise per Session: 60 min  Stress: No Stress Concern Present (10/11/2022)   [employer] of Occupational Health - Occupational Stress Questionnaire    Feeling of Stress : Not at all  Social Connections: Moderately Isolated (10/11/2022)   Social Connection and Isolation Panel [NHANES]    Frequency of Communication with Friends and Family: More than three times a week    Frequency of Social Gatherings with Friends and Family: More than three times a week    Attends Religious Services: More than 4 times per year    Active Member of [employer] or Organizations: No    Attends Banker Meetings: Never    Marital Status: Divorced  Careers information officer Violence: Not At Risk (10/11/2022)   Humiliation, Afraid, Rape, and Kick questionnaire    Fear of Current or Ex-Partner: No    Emotionally Abused: No    Physically Abused: No    Sexually Abused: No   Family History  Problem Relation Age of Onset   Emphysema Maternal Grandmother        never smoker   COPD Maternal Grandmother    Emphysema Maternal Uncle        smoked   COPD Maternal Uncle  Arthritis Mother    Diabetes Mother    Hyperlipidemia Mother    Hypertension Mother    Early death Father 54       MVA   Asthma Sister    Asthma Brother    Heart disease Maternal Grandfather     Objective: Office vital signs reviewed. BP 118/80   Pulse 60   Temp 98.8 F (37.1 C)   Ht 5\' 6"  (1.676 m)   Wt 176 lb (79.8 kg)   LMP 08/30/2002 (Approximate)   SpO2 98%   BMI 28.41 kg/m   Physical Examination:  General: Awake, alert, nontoxic female, No acute distress HEENT: Sclera white.  No conjunctival pallor. Cardio: regular rate and rhythm, S1S2 heard, no murmurs appreciated Pulm: Global expiratory wheezes, No rhonchi or rales; normal work of breathing on room air Extremities: warm, well perfused, No edema, cyanosis or clubbing; +2 pulses bilaterally      03/29/2023   10:09 AM 03/29/2023    9:57 AM 10/11/2022   12:00 PM  Depression screen PHQ 2/9  Decreased Interest  0 0  Down, Depressed, Hopeless  0 0  PHQ - 2 Score  0 0  Altered sleeping 1 0 0  Tired, decreased energy 1 0 0  Change in appetite  0 0  Feeling bad or failure about yourself  1 0 0  Trouble concentrating  0 0  Moving slowly or fidgety/restless  0 0  Suicidal thoughts  0 0  PHQ-9 Score  0 0  Difficult doing work/chores  Not difficult at all Not difficult at all      03/29/2023    9:57 AM 08/24/2022    9:03 AM 08/23/2021    8:08 AM 04/09/2021    4:34 PM  GAD 7 : Generalized Anxiety Score  Nervous, Anxious, on Edge 1 1 1  0  Control/stop worrying 0 0 0 0  Worry too much - different things 1 1 1 1    Trouble relaxing 1 1 1 2   Restless 0 0 1 1  Easily annoyed or irritable 0 0 0 0  Afraid - awful might happen 0 0 0 0  Total GAD 7 Score 3 3 4 4   Anxiety Difficulty Not difficult at all Somewhat difficult Somewhat difficult Not difficult at all   DG Chest 2 View  Result Date: 03/29/2023 CLINICAL DATA:  Chest pain for several months. EXAM: CHEST - 2 VIEW COMPARISON:  May 23, 2021. FINDINGS: The heart size and mediastinal contours are within normal limits. Both lungs are clear. Status post surgical posterior fusion of lower thoracic and lumbar spine. IMPRESSION: No active cardiopulmonary disease. Electronically Signed   By: Lupita Raider M.D.   On: 03/29/2023 12:47    Assessment/ Plan: 62 y.o. female   Caregiver stress  Atypical chest pain - Plan: EKG 12-Lead, DG Chest 2 View  Fatigue, unspecified type - Plan: CMP14+EGFR, TSH, T4, Free, Vitamin B12, VITAMIN D 25 Hydroxy (Vit-D Deficiency, Fractures), Iron, TIBC and Ferritin Panel, CBC  Hot flashes due to surgical menopause - Plan: Fezolinetant (VEOZAH) 45 MG TABS  Encounter for immunization - Plan: Flu vaccine trivalent PF, 6mos and older(Flulaval,Afluria,Fluarix,Fluzone)  I do question of much of this is resulting from caregiver stress.  I am glad to plug her into counseling services should she desire going forward.  Could consider adding medications at some point if she desires  I reviewed her EKG which was performed here in office today and this demonstrated only some bradycardia but no  arrhythmia or evidence of ischemic changes.  Chest x-ray also obtained and again unrevealing.  Of note her pulmonary exam was notable for global expiratory wheezes.  I encouraged her to continue to follow-up with allergy as directed.  I will also CC Dr. Dellis Anes as Lorain Childes  Will check for metabolic disturbance contributing to fatigue but again I do wonder if perhaps it is related to mental health  For her hot flashes, sample of Veozah provided and  I have sent this to the pharmacy.  If they do not cover we can trial some vaginal estrogen.  She has history of partial hysterectomy sparing an ovary.  Influenza vaccination administered  Raliegh Ip, DO [facility] [phone]

## 2023-03-30 LAB — CMP14+EGFR
ALT: 23 IU/L (ref 0–32)
AST: 27 IU/L (ref 0–40)
Albumin: 4.4 g/dL (ref 3.8–4.9)
Alkaline Phosphatase: 123 IU/L — ABNORMAL HIGH (ref 44–121)
BUN/Creatinine Ratio: 18 (ref 12–28)
BUN: 15 mg/dL (ref 8–27)
Bilirubin Total: 0.4 mg/dL (ref 0.0–1.2)
CO2: 26 mmol/L (ref 20–29)
Calcium: 9.5 mg/dL (ref 8.7–10.3)
Chloride: 102 mmol/L (ref 96–106)
Creatinine, Ser: 0.82 mg/dL (ref 0.57–1.00)
Globulin, Total: 2.1 g/dL (ref 1.5–4.5)
Glucose: 92 mg/dL (ref 70–99)
Potassium: 4.7 mmol/L (ref 3.5–5.2)
Sodium: 141 mmol/L (ref 134–144)
Total Protein: 6.5 g/dL (ref 6.0–8.5)
eGFR: 82 mL/min/{1.73_m2} (ref >59)

## 2023-03-30 LAB — IRON,TIBC AND FERRITIN PANEL
Ferritin: 81 ng/mL (ref 15–150)
Iron Saturation: 30 % (ref 15–55)
Iron: 94 ug/dL (ref 27–159)
Total Iron Binding Capacity: 318 ug/dL (ref 250–450)
UIBC: 224 ug/dL (ref 131–425)

## 2023-03-30 LAB — CBC
Hematocrit: 42.2 % (ref 34.0–46.6)
Hemoglobin: 13.9 g/dL (ref 11.1–15.9)
MCH: 30 pg (ref 26.6–33.0)
MCHC: 32.9 g/dL (ref 31.5–35.7)
MCV: 91 fL (ref 79–97)
Platelets: 219 10*3/uL (ref 150–450)
RBC: 4.63 x10E6/uL (ref 3.77–5.28)
RDW: 12.9 % (ref 11.7–15.4)
WBC: 5.9 10*3/uL (ref 3.4–10.8)

## 2023-03-30 LAB — VITAMIN D 25 HYDROXY (VIT D DEFICIENCY, FRACTURES): Vit D, 25-Hydroxy: 34.4 ng/mL (ref 30.0–100.0)

## 2023-03-30 LAB — T4, FREE: Free T4: 1.33 ng/dL (ref 0.82–1.77)

## 2023-03-30 LAB — TSH: TSH: 1.54 u[IU]/mL (ref 0.450–4.500)

## 2023-03-30 LAB — VITAMIN B12: Vitamin B-12: 1407 pg/mL — ABNORMAL HIGH (ref 232–1245)

## 2023-04-06 ENCOUNTER — Other Ambulatory Visit: Payer: Self-pay | Admitting: Allergy & Immunology

## 2023-04-11 ENCOUNTER — Telehealth: Payer: Self-pay

## 2023-04-11 NOTE — Telephone Encounter (Signed)
Albesa Seen (KeyTamela Gammon) PA Case ID #: [id number] Rx #: [id number] Need Help? Call us at [phone] Status sent iconSent to Plan today Next Steps The plan will fax you a determination, typically within 1 to 5 business days. Drug Veozah 45MG  tablets ePA cloud Educational psychologist PA Form Original Claim Info [phone]

## 2023-04-12 NOTE — Telephone Encounter (Signed)
Pharmacy Patient Advocate Encounter  Received notification from [facility] that Prior Authorization for Veozah 45MG  tablets has been DENIED.  See denial reason below. No denial letter attached in CMM. Will attache denial letter to Media tab once received.   PA #/Case ID/Reference #: [id number]   DENIAL REASON: The drug you asked for is not listed in your preferred drug list (formulary). The preferred drug(s), you may not have tried are: estradiol-norethindrone acet tablet AND citalopram tablet. Your provider needs to give [place] medical reasons why the preferred drug(s) would not work for you and/or would have bad side effects.

## 2023-04-17 ENCOUNTER — Other Ambulatory Visit: Payer: Self-pay | Admitting: Family Medicine

## 2023-04-17 DIAGNOSIS — E8941 Symptomatic postprocedural ovarian failure: Secondary | ICD-10-CM

## 2023-04-17 MED ORDER — VEOZAH 45 MG PO TABS
1.0000 | ORAL_TABLET | Freq: Every day | ORAL | 3 refills | Status: DC
Start: 2023-04-17 — End: 2023-09-06

## 2023-04-17 NOTE — Telephone Encounter (Signed)
Let her know I'm sending to the mail order pharmacy as we discussed.  Have her call: [phone] to set up an account with them

## 2023-04-18 NOTE — Telephone Encounter (Signed)
PATIENT AWARE

## 2023-04-21 DIAGNOSIS — M5106 Intervertebral disc disorders with myelopathy, lumbar region: Secondary | ICD-10-CM | POA: Diagnosis not present

## 2023-04-21 DIAGNOSIS — G894 Chronic pain syndrome: Secondary | ICD-10-CM | POA: Diagnosis not present

## 2023-05-07 ENCOUNTER — Other Ambulatory Visit: Payer: Self-pay | Admitting: Allergy & Immunology

## 2023-05-09 ENCOUNTER — Ambulatory Visit: Payer: Medicare HMO | Admitting: Allergy & Immunology

## 2023-06-06 ENCOUNTER — Other Ambulatory Visit: Payer: Self-pay | Admitting: Allergy & Immunology

## 2023-06-12 NOTE — Progress Notes (Signed)
Acute Office Visit  Subjective:     Patient ID: [name], female    DOB: [birth year], 62 y.o.   MRN: [id number]  Chief Complaint  Patient presents with   Nasal Congestion    Having congestion for couple months. Tried to get in with allergist and has not been able to    HPI [name] 62 year old female present on June 13, 2023 for an acute visit causing concern for wheezing, shortness of breath cough for over 25-month. PMH asthma, and seasonal allergy Cough: Patient complains of productive cough, wheezing, and SOB .  Symptoms began 1 month ago " I have called my allergist for an appointment and never heard anything".  The cough is productive of clear sputum and is aggravated by nothing Associated symptoms include:shortness of breath, sputum production, and wheezing.  Patient  have a history of asthma. Patient  have a history of environmental allergens. Patient  recent travel.   Review of Systems  Constitutional:  Negative for chills and fever.  HENT:  Negative for congestion and sore throat.   Respiratory:  Positive for cough, shortness of breath and wheezing.        Clear sputum  Cardiovascular:  Negative for chest pain and leg swelling.  Gastrointestinal:  Negative for constipation, diarrhea, nausea and vomiting.  Skin:  Negative for itching and rash.  Neurological:  Negative for dizziness and headaches.  Endo/Heme/Allergies:  Negative for environmental allergies and polydipsia. Does not bruise/bleed easily.   Negative unless indicated in HPI    Objective:    BP 131/66   Pulse (!) 54   Temp 98 F (36.7 C) (Temporal)   Ht 5\' 6"  (1.676 m)   Wt 176 lb 12.8 oz (80.2 kg)   LMP 08/30/2002 (Approximate)   SpO2 95%   BMI 28.54 kg/m  BP Readings from Last 3 Encounters:  06/13/23 131/66  03/29/23 118/80  02/22/23 122/72   Wt Readings from Last 3 Encounters:  06/13/23 176 lb 12.8 oz (80.2 kg)  03/29/23 176 lb (79.8 kg)  02/22/23 173 lb 8 oz (78.7 kg)       Physical Exam Vitals and nursing note reviewed.  Constitutional:      Appearance: Normal appearance.  HENT:     Head: Normocephalic and atraumatic.     Nose: Nose normal.  Eyes:     General: No scleral icterus.    Extraocular Movements: Extraocular movements intact.     Conjunctiva/sclera: Conjunctivae normal.     Pupils: Pupils are equal, round, and reactive to light.  Cardiovascular:     Rate and Rhythm: Normal rate and regular rhythm.  Pulmonary:     Breath sounds: Wheezing present.  Musculoskeletal:        General: Normal range of motion.     Right lower leg: No edema.     Left lower leg: No edema.  Skin:    General: Skin is warm and dry.  Neurological:     Mental Status: She is alert and oriented to person, place, and time. Mental status is at baseline.  Psychiatric:        Mood and Affect: Mood normal.        Behavior: Behavior normal.        Thought Content: Thought content normal.        Judgment: Judgment normal.     No results found for any visits on 06/13/23.      Assessment & Plan:  Cough variant asthma -  predniSONE; As directed x 6 days  Dispense: 21 tablet; Refill: 0  Seasonal and perennial allergic rhinitis -     predniSONE; As directed x 6 days  Dispense: 21 tablet; Refill: 0  [name] is a 62 year old Caucasian female seen yesterday for cough no acute distress But wheezing #21 dispensed patient instructed to take it over the course of 6 days Increase hydration Tylenol/ibuprofen if develop any fever Encourage healthy lifestyle choices, including diet (rich in fruits, vegetables, and lean proteins, and low in salt and simple carbohydrates) and exercise (at least 30 minutes of moderate physical activity daily).     The above assessment and management plan was discussed with the patient. The patient verbalized understanding of and has agreed to the management plan. Patient is aware to call the clinic if they develop any new symptoms or if symptoms  persist or worsen. Patient is aware when to return to the clinic for a follow-up visit. Patient educated on when it is appropriate to go to the emergency department.  Return if symptoms worsen or fail to improve.  Arrie Aran Santa Lighter, [place] [facility] [address] [place], [place] [address] [phone]  Note: This document was prepared by [employer] voice dictation technology and any errors that results from this process are unintentional.

## 2023-06-13 ENCOUNTER — Ambulatory Visit (INDEPENDENT_AMBULATORY_CARE_PROVIDER_SITE_OTHER): Payer: Medicare HMO | Admitting: Nurse Practitioner

## 2023-06-13 ENCOUNTER — Encounter: Payer: Self-pay | Admitting: Nurse Practitioner

## 2023-06-13 VITALS — BP 131/66 | HR 54 | Temp 98.0°F | Ht 66.0 in | Wt 176.8 lb

## 2023-06-13 DIAGNOSIS — J3089 Other allergic rhinitis: Secondary | ICD-10-CM

## 2023-06-13 DIAGNOSIS — J302 Other seasonal allergic rhinitis: Secondary | ICD-10-CM | POA: Diagnosis not present

## 2023-06-13 DIAGNOSIS — J45991 Cough variant asthma: Secondary | ICD-10-CM | POA: Diagnosis not present

## 2023-06-13 MED ORDER — PREDNISONE 10 MG (21) PO TBPK: ORAL_TABLET | ORAL | 0 refills | Status: DC

## 2023-06-19 DIAGNOSIS — G894 Chronic pain syndrome: Secondary | ICD-10-CM | POA: Diagnosis not present

## 2023-06-19 DIAGNOSIS — M5106 Intervertebral disc disorders with myelopathy, lumbar region: Secondary | ICD-10-CM | POA: Diagnosis not present

## 2023-06-22 ENCOUNTER — Encounter: Payer: Self-pay | Admitting: Family Medicine

## 2023-06-22 ENCOUNTER — Telehealth: Payer: Self-pay | Admitting: Family Medicine

## 2023-06-22 DIAGNOSIS — J4531 Mild persistent asthma with (acute) exacerbation: Secondary | ICD-10-CM

## 2023-06-22 DIAGNOSIS — R052 Subacute cough: Secondary | ICD-10-CM

## 2023-06-22 DIAGNOSIS — R0981 Nasal congestion: Secondary | ICD-10-CM

## 2023-06-22 NOTE — Telephone Encounter (Signed)
Pt called stating that she recently had a visit here and was prescribed prednisone for her symptoms. Was told to call the office if medicine did not work. Pt says medicine is not helping. Please advise.

## 2023-06-23 MED ORDER — ALBUTEROL SULFATE HFA 108 (90 BASE) MCG/ACT IN AERS: 2.0000 | INHALATION_SPRAY | RESPIRATORY_TRACT | 2 refills | Status: DC | PRN

## 2023-06-23 MED ORDER — AZITHROMYCIN 250 MG PO TABS
ORAL_TABLET | ORAL | 0 refills | Status: DC
Start: 2023-06-23 — End: 2023-08-28

## 2023-06-23 MED ORDER — DOXYCYCLINE HYCLATE 100 MG PO TABS
100.0000 mg | ORAL_TABLET | Freq: Two times a day (BID) | ORAL | 0 refills | Status: AC
Start: 2023-06-23 — End: 2023-06-30

## 2023-06-23 NOTE — Telephone Encounter (Signed)
Left patient detailed message that rxs sent to pharmacy and to contact the office if no better after taking these so she can be evaluated in person.

## 2023-06-23 NOTE — Telephone Encounter (Signed)
Examining provider not in office today. I'm sending in her albuterol and a zpak. If still having issues, I want her seen in office.  Meds ordered this encounter  Medications   albuterol (VENTOLIN HFA) 108 (90 Base) MCG/ACT inhaler    Sig: Inhale 2 puffs into the lungs every 4 (four) hours as needed for wheezing or shortness of breath.    Dispense:  1 each    Refill:  2   azithromycin (ZITHROMAX) 250 MG tablet    Sig: Take 2 tablets today, then take 1 tablet daily until gone.    Dispense:  6 tablet    Refill:  0

## 2023-07-10 ENCOUNTER — Encounter: Payer: Self-pay | Admitting: Allergy & Immunology

## 2023-07-16 DIAGNOSIS — R0602 Shortness of breath: Secondary | ICD-10-CM | POA: Diagnosis not present

## 2023-07-16 DIAGNOSIS — J45901 Unspecified asthma with (acute) exacerbation: Secondary | ICD-10-CM | POA: Diagnosis not present

## 2023-07-16 DIAGNOSIS — Z6826 Body mass index (BMI) 26.0-26.9, adult: Secondary | ICD-10-CM | POA: Diagnosis not present

## 2023-07-16 DIAGNOSIS — R059 Cough, unspecified: Secondary | ICD-10-CM | POA: Diagnosis not present

## 2023-07-16 DIAGNOSIS — R030 Elevated blood-pressure reading, without diagnosis of hypertension: Secondary | ICD-10-CM | POA: Diagnosis not present

## 2023-07-16 DIAGNOSIS — E663 Overweight: Secondary | ICD-10-CM | POA: Diagnosis not present

## 2023-07-16 DIAGNOSIS — R051 Acute cough: Secondary | ICD-10-CM | POA: Diagnosis not present

## 2023-07-17 DIAGNOSIS — G5602 Carpal tunnel syndrome, left upper limb: Secondary | ICD-10-CM | POA: Diagnosis not present

## 2023-07-19 ENCOUNTER — Ambulatory Visit: Payer: Medicare HMO | Admitting: Allergy & Immunology

## 2023-07-19 ENCOUNTER — Encounter: Payer: Self-pay | Admitting: Allergy & Immunology

## 2023-07-19 VITALS — BP 136/76 | HR 68 | Temp 97.7°F | Resp 20 | Wt 169.0 lb

## 2023-07-19 DIAGNOSIS — J3089 Other allergic rhinitis: Secondary | ICD-10-CM

## 2023-07-19 DIAGNOSIS — J4540 Moderate persistent asthma, uncomplicated: Secondary | ICD-10-CM | POA: Diagnosis not present

## 2023-07-19 DIAGNOSIS — J302 Other seasonal allergic rhinitis: Secondary | ICD-10-CM | POA: Diagnosis not present

## 2023-07-19 DIAGNOSIS — J4541 Moderate persistent asthma with (acute) exacerbation: Secondary | ICD-10-CM

## 2023-07-19 MED ORDER — CETIRIZINE HCL 10 MG PO TABS: 10.0000 mg | ORAL_TABLET | Freq: Every day | ORAL | 5 refills | Status: DC

## 2023-07-19 MED ORDER — METHYLPREDNISOLONE ACETATE 80 MG/ML IJ SUSP
80.0000 mg | Freq: Once | INTRAMUSCULAR | Status: AC
Start: 2023-07-19 — End: 2023-07-19
  Administered 2023-07-19: 80 mg via INTRAMUSCULAR

## 2023-07-19 MED ORDER — BREZTRI AEROSPHERE 160-9-4.8 MCG/ACT IN AERO: 2.0000 | INHALATION_SPRAY | Freq: Two times a day (BID) | RESPIRATORY_TRACT | 0 refills | Status: DC

## 2023-07-19 MED ORDER — TEZEPELUMAB-EKKO 210 MG/1.91ML ~~LOC~~ SOSY
210.0000 mg | PREFILLED_SYRINGE | Freq: Once | SUBCUTANEOUS | Status: AC
Start: 2023-07-19 — End: 2023-07-19
  Administered 2023-07-19: 210 mg via SUBCUTANEOUS

## 2023-07-19 MED ORDER — PREDNISONE 10 MG PO TABS: ORAL_TABLET | ORAL | 0 refills | Status: DC

## 2023-07-19 MED ORDER — BREZTRI AEROSPHERE 160-9-4.8 MCG/ACT IN AERO: 2.0000 | INHALATION_SPRAY | Freq: Two times a day (BID) | RESPIRATORY_TRACT | 5 refills | Status: AC

## 2023-07-19 NOTE — Patient Instructions (Addendum)
1. Moderate persistent asthma, uncomplicated - Lung testing not done today.  - We are starting you on Tezspire (sample provided today). - DepoMedrol injection given today. - Start the prednisone taper tomorrow.  - We are starting Breztri instead of Symbicort. - We will try to get that covered by your insurance.  - Daily controller medication(s): Breztri 160/4.17mcg two puffs once daily with spacer in the morning and Singulair daily - Prior to physical activity: albuterol 2 puffs 10-15 minutes before physical activity. - Rescue medications: albuterol 4 puffs every 4-6 hours as needed or albuterol nebulizer one vial every 4-6 hours as needed - Changes during respiratory infections or worsening symptoms: Increase Symbicort to two puffs TWICE DAILY for one to two weeks. - Asthma control goals:  * Full participation in all desired activities (may need albuterol before activity) * Albuterol use two time or less a week on average (not counting use with activity) * Cough interfering with sleep two time or less a month * Oral steroids no more than once a year * No hospitalizations  2. Chronic rhinitis (weeds, indoor molds, outdoor molds, cat, dog, and cockroach) - Continue with fluticasone nasal spray 1-2 sprays per nostril daily. - Continue with Astelin 2 sprays per nostril up to twice daily as needed.  - Continue with cetirizine 10mg  daily. - Continue with montelukast 10mg  daily.  - We will look into medication changes if the prednisone does not do the trick.   3. Return in about 6 weeks (around 08/30/2023).    Please inform us of any Emergency Department visits, hospitalizations, or changes in symptoms. Call us before going to the ED for breathing or allergy symptoms since we might be able to fit you in for a sick visit. Feel free to contact us anytime with any questions, problems, or concerns.  It was a pleasure to see you again today!  Websites that have reliable patient information: 1.  American Academy of Asthma, Allergy, and Immunology: www.aaaai.org 2. Food Allergy Research and Education (FARE): foodallergy.org 3. Mothers of Asthmatics: http://www.asthmacommunitynetwork.org 4. American College of Allergy, Asthma, and Immunology: www.acaai.org   COVID-19 Vaccine Information can be found at: [email or web address] For questions related to vaccine distribution or appointments, please email [email or web address] or call [phone].   We realize that you might be concerned about having an allergic reaction to the COVID19 vaccines. To help with that concern, WE ARE OFFERING THE COVID19 VACCINES IN OUR OFFICE! Ask the front desk for dates!     "Like" [place] on Facebook and Instagram for our latest updates!      A healthy democracy works best when [employer] participate! Make sure you are registered to vote! If you have moved or changed any of your contact information, you will need to get this updated before voting!  In some cases, you MAY be able to register to vote online: [email or web address]      We are partnering with [employer] to provide home spirometry devices. These are sent to your home and connect to your smart phone. The company will reach out to you (after you scan the code) and go through the process and estimate any insurance copayments, if applicable. Then we will read your spirometry readings remotely from our office to make sure that you are heading in the right direction!

## 2023-07-19 NOTE — Progress Notes (Signed)
FOLLOW UP  Date of Service/Encounter:  07/19/23   Assessment:   Moderate persistent asthma - controlled without a biologic at this point in time    Seasonal and perennial allergic rhinitis (indoor and outdoor molds, cat, dog, cockroach)   Chronic back pain - with a history of scoliosis (rods placement in 2010) and now on disability    History of ground glass opacities in 2016 - repeat chest CT markedly improved   Recent COVID-19 infection in August 2022   S/p bilateral knee replacement    Leg pain - felt to be secondary to Fasenra (planning to transition to [employer])    Plan/Recommendations:   1. Moderate persistent asthma, uncomplicated - Lung testing not done today.  - We are starting you on Tezspire (sample provided today). - DepoMedrol injection given today. - Start the prednisone taper tomorrow.  - We are starting Breztri instead of Symbicort. - We will try to get that covered by your insurance.  - Daily controller medication(s): Breztri 160/4.57mcg two puffs once daily with spacer in the morning and Singulair daily - Prior to physical activity: albuterol 2 puffs 10-15 minutes before physical activity. - Rescue medications: albuterol 4 puffs every 4-6 hours as needed or albuterol nebulizer one vial every 4-6 hours as needed - Changes during respiratory infections or worsening symptoms: Increase Symbicort to two puffs TWICE DAILY for one to two weeks. - Asthma control goals:  * Full participation in all desired activities (may need albuterol before activity) * Albuterol use two time or less a week on average (not counting use with activity) * Cough interfering with sleep two time or less a month * Oral steroids no more than once a year * No hospitalizations  2. Chronic rhinitis (weeds, indoor molds, outdoor molds, cat, dog, and cockroach) - Continue with fluticasone nasal spray 1-2 sprays per nostril daily. - Continue with Astelin 2 sprays per nostril up to twice  daily as needed.  - Continue with cetirizine 10mg  daily. - Continue with montelukast 10mg  daily.  - We will look into medication changes if the prednisone does not do the trick.   3. Return in about 6 weeks (around 08/30/2023).     Subjective:   [name] is a 63 y.o. female presenting today for follow up of  Chief Complaint  Patient presents with   Asthma    Congestion in her chest. Went to Urgent Care Sunday.     [name] has a history of the following: Patient Active Problem List   Diagnosis Date Noted   Family history of early CAD 08/24/2022   Mixed hyperlipidemia 08/24/2022   History of total knee arthroplasty, right 08/24/2022   Caregiver stress 08/24/2022   GERD (gastroesophageal reflux disease)    Positive colorectal cancer screening using Cologuard test    Esophageal dysphagia    Varicose veins of bilateral lower extremities with pain 01/02/2020   Seasonal and perennial allergic rhinitis 04/15/2018   Fusion of spine, thoracolumbar region 11/29/2016   Chronic insomnia 11/29/2016   Eosinophilia 03/29/2016   Moderate persistent asthma, uncomplicated 03/29/2016   Mixed rhinitis 03/29/2016   Other social stressor 03/29/2016   Chronic migraine w/o aura w/o status migrainosus, not intractable 03/14/2016   Arthritis of knee, degenerative 07/09/2015   Chronic low back pain 04/15/2015   Cough variant asthma 08/09/2014   Pulmonary infiltrates with eosinophilia (HCC) 08/09/2014    History obtained from: chart review and patient.  Discussed the use of AI scribe software  for clinical note transcription with the patient and/or guardian, who gave verbal consent to proceed.  [name] is a 63 y.o. female presenting for a sick visit.  She was last seen in August 2024.  At that time, her lung testing looked good.  She felt that she wanted to with Tezspire and she seemed to be doing well overall.  She continued on Symbicort 160 mcg 2 puffs once daily as well as Singulair daily.   For her rhinitis, she continue with Flonase as well as Astelin, Zyrtec, and Singulair.  [name] presents with a worsening of symptoms since October. She reports a persistent feeling of being unwell, with increased wheezing noted by her primary care physician. Despite attempts to manage the symptoms with a course of prednisone in November and December, He continued to worsen.  In January, the patient's condition deteriorated to the point where she experienced difficulty walking due to breathlessness. An urgent care visit resulted in the administration of two breathing treatments, a steroid injection, and a five-day course of 40mg  prednisone. Despite these interventions, she continues to struggle with respiratory symptoms, requiring two additional breathing treatments prior to this visit today. She had a CXR in Urgent Care that looked fairly good per the patient. She had a pulse ox of 94%.   [name] also reports a persistent issue with nasal congestion, which she describes as 'snot in the way.' This has not improved despite the use of Symbicort, taken as two puffs twice a day. The patient has been stable on this medication for a while.  Her overall overall health is further complicated by the illness of her long-term partner, who has stage four colon cancer, chronic kidney disease, and heart problems. This has resulted in significant stress and frequent hospital visits, which may be impacting the patient's ability to manage her own health. Despite these challenges, [name] has been able to maintain some physical activity, including an hour-long workout at the gym.     Otherwise, there have been no changes to her past medical history, surgical history, family history, or social history.    Review of systems otherwise negative other than that mentioned in the HPI.    Objective:   Blood pressure 136/76, pulse 68, temperature 97.7 F (36.5 C), resp. rate 20, weight 169 lb (76.7 kg), last menstrual period  08/30/2002, SpO2 90%. Body mass index is 27.28 kg/m.    Physical Exam Vitals reviewed.  Constitutional:      General: She is not in acute distress.    Appearance: She is well-developed. She is ill-appearing.     Comments: Speaking in full sentences.  HENT:     Head: Normocephalic and atraumatic.     Right Ear: Tympanic membrane, ear canal and external ear normal.     Left Ear: Tympanic membrane, ear canal and external ear normal.     Nose: No nasal deformity, septal deviation, mucosal edema or rhinorrhea.     Right Turbinates: Enlarged, swollen and pale.     Left Turbinates: Enlarged, swollen and pale.     Right Sinus: No maxillary sinus tenderness or frontal sinus tenderness.     Left Sinus: No maxillary sinus tenderness or frontal sinus tenderness.     Comments: No nasal polyps.    Mouth/Throat:     Mouth: Mucous membranes are not pale and not dry.     Pharynx: Uvula midline.  Eyes:     General: Lids are normal. Allergic shiner present.  Right eye: No discharge.        Left eye: No discharge.     Conjunctiva/sclera: Conjunctivae normal.     Right eye: Right conjunctiva is not injected. No chemosis.    Left eye: Left conjunctiva is not injected. No chemosis.    Pupils: Pupils are equal, round, and reactive to light.  Cardiovascular:     Rate and Rhythm: Normal rate and regular rhythm.     Heart sounds: Normal heart sounds.  Pulmonary:     Effort: Pulmonary effort is normal. No tachypnea, accessory muscle usage or respiratory distress.     Breath sounds: Examination of the right-upper field reveals wheezing. Examination of the left-upper field reveals wheezing. Examination of the right-middle field reveals wheezing. Examination of the left-middle field reveals wheezing. Examination of the right-lower field reveals wheezing. Examination of the left-lower field reveals wheezing. Wheezing present. No rhonchi or rales.     Comments: Moving air well in all lung fields.  No  increased work of breathing. Chest:     Chest wall: No tenderness.  Musculoskeletal:     Comments: Scars over the bilateral knees.  Ambulating without a cane.  Lymphadenopathy:     Cervical: No cervical adenopathy.  Skin:    General: Skin is warm.     Capillary Refill: Capillary refill takes less than 2 seconds.     Coloration: Skin is not pale.     Findings: No abrasion, erythema, petechiae or rash. Rash is not papular, urticarial or vesicular.     Comments: No eczematous or urticarial lesions noted.  Neurological:     Mental Status: She is alert.  Psychiatric:        Behavior: Behavior is cooperative.      Diagnostic studies:   Depo-Medrol injection given today in combination with a sample of Tezspire.  Patient was monitored for 15 minutes and tolerated this without a problem.       Malachi Bonds, MD  [facility]

## 2023-08-01 ENCOUNTER — Telehealth: Payer: Self-pay | Admitting: *Deleted

## 2023-08-01 NOTE — Telephone Encounter (Signed)
-----   Message from Alfonse Spruce sent at 07/19/2023  1:52 PM EST ----- Dorothea Ogle started today.

## 2023-08-01 NOTE — Telephone Encounter (Signed)
 Called patient to discuss Tezspire  she advised that since taking Tezspire  her shoulders have hurt like it never has before. She really liked Fasenra  but she states caused leg pain. She doesn't think Tezspire  is right for her. I advised her I would send message to Dr Iva regarding same.

## 2023-08-01 NOTE — Telephone Encounter (Signed)
Goodness - ok thanks for the update!   Malachi Bonds, MD [facility]

## 2023-08-17 DIAGNOSIS — G894 Chronic pain syndrome: Secondary | ICD-10-CM | POA: Diagnosis not present

## 2023-08-17 DIAGNOSIS — M5106 Intervertebral disc disorders with myelopathy, lumbar region: Secondary | ICD-10-CM | POA: Diagnosis not present

## 2023-08-28 ENCOUNTER — Other Ambulatory Visit: Payer: Self-pay | Admitting: Family Medicine

## 2023-08-28 ENCOUNTER — Encounter: Payer: Medicare HMO | Admitting: Family Medicine

## 2023-08-28 DIAGNOSIS — J4531 Mild persistent asthma with (acute) exacerbation: Secondary | ICD-10-CM

## 2023-08-28 NOTE — Progress Notes (Deleted)
 [name] is a 63 y.o. female presents to office today for annual physical exam examination.    Concerns today include: 1. ***  Occupation: ***, Marital status: ***, Substance use: *** Health Maintenance Due  Topic Date Due   COVID-19 Vaccine (1) Never done   Zoster Vaccines- Shingrix (1 of 2) Never done   Cervical Cancer Screening (HPV/Pap Cotest)  01/17/2015   Pneumococcal Vaccine 47-70 Years old (2 of 2 - PPSV23 or PCV20) 01/24/2018   DTaP/Tdap/Td (3 - Td or Tdap) 06/28/2021   MAMMOGRAM  10/22/2021   Medicare Annual Wellness (AWV)  10/11/2023   Refills needed today: ***  Immunization History  Administered Date(s) Administered   Influenza, Seasonal, Injecte, Preservative Fre 05/28/2013, 03/29/2023   Influenza,inj,Quad PF,6+ Mos 06/07/2017, 04/16/2018   Pneumococcal Conjugate-13 11/29/2017   Td 06/28/2011   Tdap 06/29/2011   Past Medical History:  Diagnosis Date   Arthritis    Spine, R knee   Asthma    Back pain    Rod in back   GERD (gastroesophageal reflux disease)    History of blood transfusion    Back surgery   hx Cough 09/03/2014   Followed in Pulmonary clinic/ [facility]/ [place] - sinus CT 09/08/2014> Mild mucosal thickening involves the paranasal sinuses  - Eos 1.2  08/06/14 - Allergy profile 11/27/14 >  Eos 1.0/ IgE  41 with neg RAST     Mixed hyperlipidemia 08/24/2022   Social History   Socioeconomic History   Marital status: Significant Other    Spouse name: Ricky   Number of children: 2   Years of education: 12   Highest education level: Not on file  Occupational History   Occupation: disabled    Comment: back  Tobacco Use   Smoking status: Never   Smokeless tobacco: Never  Vaping Use   Vaping status: Never Used  Substance and Sexual Activity   Alcohol use: No   Drug use: No   Sexual activity: Yes    Birth control/protection: Surgical  Other Topics Concern   Not on file  Social History Narrative   Lives with Clide Cliff - 27 years    Clide Cliff has end stage colon cancer   Two sons/ grandchildren   Disabled from back pain/had scoliosis. Has had back reconstruction.    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    Social Drivers of Corporate investment banker Strain: Low Risk  (10/11/2022)   Overall Financial Resource Strain (CARDIA)    Difficulty of Paying Living Expenses: Not hard at all  Food Insecurity: No Food Insecurity (10/11/2022)   Hunger Vital Sign    Worried About Running Out of Food in the Last Year: Never true    Ran Out of Food in the Last Year: Never true  Transportation Needs: No Transportation Needs (10/11/2022)   PRAPARE - Administrator, Civil Service (Medical): No    Lack of Transportation (Non-Medical): No  Physical Activity: Sufficiently Active (10/11/2022)   Exercise Vital Sign    Days of Exercise per Week: 3 days    Minutes of Exercise per Session: 60 min  Stress: No Stress Concern Present (10/11/2022)   [employer] of Occupational Health - Occupational Stress Questionnaire    Feeling of Stress : Not at all  Social Connections: Moderately Isolated (10/11/2022)   Social Connection and Isolation Panel [NHANES]    Frequency of Communication with Friends and Family: More than three times a week    Frequency of Social Gatherings with  Friends and Family: More than three times a week    Attends Religious Services: More than 4 times per year    Active Member of Clubs or Organizations: No    Attends Banker Meetings: Never    Marital Status: Divorced  Catering manager Violence: Not At Risk (10/11/2022)   Humiliation, Afraid, Rape, and Kick questionnaire    Fear of Current or Ex-Partner: No    Emotionally Abused: No    Physically Abused: No    Sexually Abused: No   Past Surgical History:  Procedure Laterality Date   ABDOMINAL HYSTERECTOMY  2004   "cysts", pelvic pain   BACK SURGERY  2010   rod   BACK SURGERY  2014   broken hardware removal   BIOPSY  05/19/2021   Procedure: BIOPSY;   Surgeon: Malissa Hippo, MD;  Location: [facility];  Service: Endoscopy;;   COLONOSCOPY WITH PROPOFOL N/A 05/19/2021   Procedure: COLONOSCOPY WITH PROPOFOL;  Surgeon: Malissa Hippo, MD;  Location: [facility];  Service: Endoscopy;  Laterality: N/A;  10:10   ESOPHAGEAL DILATION N/A 05/19/2021   Procedure: ESOPHAGEAL DILATION;  Surgeon: Malissa Hippo, MD;  Location: [facility];  Service: Endoscopy;  Laterality: N/A;   ESOPHAGOGASTRODUODENOSCOPY (EGD) WITH PROPOFOL N/A 05/19/2021   Procedure: ESOPHAGOGASTRODUODENOSCOPY (EGD) WITH PROPOFOL;  Surgeon: Malissa Hippo, MD;  Location: [facility];  Service: Endoscopy;  Laterality: N/A;   KNEE ARTHROSCOPY Right 2017   KNEE ARTHROSCOPY Left 2022   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   Procedure: TOTAL KNEE ARTHROPLASTY;  Surgeon: Frederico Hamman, MD;  Location: [facility];  Service: Orthopedics;  Laterality: Left;   TOTAL KNEE ARTHROPLASTY Right 01/28/2022   Procedure: TOTAL KNEE ARTHROPLASTY;  Surgeon: Frederico Hamman, MD;  Location: [facility];  Service: Orthopedics;  Laterality: Right;   Family History  Problem Relation Age of Onset   Emphysema Maternal Grandmother        never smoker   COPD Maternal Grandmother    Emphysema Maternal Uncle        smoked   COPD Maternal Uncle    Arthritis Mother    Diabetes Mother    Hyperlipidemia Mother    Hypertension Mother    Early death Father 20       MVA   Asthma Sister    Asthma Brother    Heart disease Maternal Grandfather     Current Outpatient Medications:    acetaminophen (TYLENOL) 500 MG tablet, Take 1,000 mg by mouth every 8 (eight) hours as needed for moderate pain., Disp: , Rfl:    albuterol (VENTOLIN HFA) 108 (90 Base) MCG/ACT inhaler, Inhale 2 puffs into the lungs every 4 (four) hours as needed for wheezing or shortness of breath., Disp: 1 each, Rfl: 2   azelastine (ASTELIN) 0.1 % nasal spray, INSTILL 2 SPRAYS IN EACH NOSTRIL TWICE DAILY AS DIRECTED, Disp: 30 mL, Rfl: 5    Benralizumab (FASENRA) 30 MG/ML SOSY, Inject 1 mL (30 mg total) into the skin every 8 (eight) weeks. (Patient not taking: Reported on 07/19/2023), Disp: 1 mL, Rfl: 6   benzonatate (TESSALON) 200 MG capsule, Take 200 mg by mouth 3 (three) times daily as needed for cough., Disp: , Rfl:    Budeson-Glycopyrrol-Formoterol (BREZTRI AEROSPHERE) 160-9-4.8 MCG/ACT AERO, Inhale 2 puffs into the lungs in the morning and at bedtime., Disp: 5.9 g, Rfl: 0   celecoxib (CELEBREX) 200 MG capsule, Take by mouth 2 (two) times daily., Disp: , Rfl:  cetirizine (ZYRTEC) 10 MG tablet, Take 1 tablet (10 mg total) by mouth daily., Disp: 30 tablet, Rfl: 5   diclofenac (VOLTAREN) 75 MG EC tablet, Take 75 mg by mouth 2 (two) times daily., Disp: , Rfl:    EPINEPHrine 0.3 mg/0.3 mL IJ SOAJ injection, Inject 0.3 mg into the muscle as needed for anaphylaxis., Disp: , Rfl:    Fezolinetant (VEOZAH) 45 MG TABS, Take 1 tablet (45 mg total) by mouth daily. (Patient not taking: Reported on 07/19/2023), Disp: 90 tablet, Rfl: 3   fluticasone (FLONASE) 50 MCG/ACT nasal spray, INSTILL 2 SPRAYS IN EACH NOSTRIL EVERY MORNING AND AT BEDTIME, Disp: 16 g, Rfl: 5   hydrOXYzine (ATARAX/VISTARIL) 50 MG tablet, TAKE 1/2 TO 1 TABLET BY MOUTH EVERY 8 HOURS AS NEEDED FOR ANXIETY, Disp: 30 tablet, Rfl: prn   montelukast (SINGULAIR) 10 MG tablet, TAKE 1 TABLET BY MOUTH AT BEDTIME, Disp: 30 tablet, Rfl: 3   morphine (MSIR) 15 MG tablet, Take 15 mg by mouth every 8 (eight) hours as needed. Back Pain, Disp: , Rfl:    Multiple Vitamins-Minerals (MULTIVITAMIN WITH MINERALS) tablet, Take 1 tablet by mouth daily., Disp: , Rfl:    pantoprazole (PROTONIX) 40 MG tablet, TAKE 1 TABLET BY MOUTH DAILY, Disp: 30 tablet, Rfl: 5   predniSONE (DELTASONE) 10 MG tablet, Take 3 tabs (30mg ) twice daily for 3 days, then 2 tabs (20mg ) twice daily for 3 days, then 1 tab (10mg ) twice daily for 3 days, then STOP., Disp: 36 tablet, Rfl: 0   Propylene Glycol (SYSTANE BALANCE) 0.6 %  SOLN, Place 1 drop into both eyes as needed (dry eyes)., Disp: , Rfl:    SYMBICORT 160-4.5 MCG/ACT inhaler, INHALE TWO PUFFS BY MOUTH TWICE DAILY, Disp: 10.2 g, Rfl: 5   tiZANidine (ZANAFLEX) 4 MG tablet, Take 1 tablet (4 mg total) by mouth every 8 (eight) hours as needed for muscle spasms., Disp: 30 tablet, Rfl: 0  Current Facility-Administered Medications:    Benralizumab SOSY 30 mg, 30 mg, Subcutaneous, Q28 days, Dellis Anes, Hetty Ely, MD, 30 mg at 09/07/22 1106  No Known Allergies   ROS: Review of Systems {ros; complete:30496}    Physical exam {Exam, Complete:[phone]}      03/29/2023   10:09 AM 03/29/2023    9:57 AM 10/11/2022   12:00 PM  Depression screen PHQ 2/9  Decreased Interest  0 0  Down, Depressed, Hopeless  0 0  PHQ - 2 Score  0 0  Altered sleeping 1 0 0  Tired, decreased energy 1 0 0  Change in appetite  0 0  Feeling bad or failure about yourself  1 0 0  Trouble concentrating  0 0  Moving slowly or fidgety/restless  0 0  Suicidal thoughts  0 0  PHQ-9 Score  0 0  Difficult doing work/chores  Not difficult at all Not difficult at all      03/29/2023    9:57 AM 08/24/2022    9:03 AM 08/23/2021    8:08 AM 04/09/2021    4:34 PM  GAD 7 : Generalized Anxiety Score  Nervous, Anxious, on Edge 1 1 1  0  Control/stop worrying 0 0 0 0  Worry too much - different things 1 1 1 1   Trouble relaxing 1 1 1 2   Restless 0 0 1 1  Easily annoyed or irritable 0 0 0 0  Afraid - awful might happen 0 0 0 0  Total GAD 7 Score 3 3 4 4   Anxiety Difficulty Not  difficult at all Somewhat difficult Somewhat difficult Not difficult at all     Assessment/ Plan: [name] here for annual physical exam.   Annual physical exam  Screening for malignant neoplasm of cervix  Screening for HIV without presence of risk factors  Mixed hyperlipidemia  Caregiver stress  ***  Counseled on healthy lifestyle choices, including diet (rich in fruits, vegetables and lean meats and low in  salt and simple carbohydrates) and exercise (at least 30 minutes of moderate physical activity daily).  Patient to follow up ***  Christepher Melchior M. Nadine Counts, DO

## 2023-08-30 ENCOUNTER — Encounter: Payer: Self-pay | Admitting: Family Medicine

## 2023-09-06 ENCOUNTER — Encounter: Payer: Self-pay | Admitting: Allergy & Immunology

## 2023-09-06 ENCOUNTER — Ambulatory Visit (INDEPENDENT_AMBULATORY_CARE_PROVIDER_SITE_OTHER): Payer: Medicare HMO | Admitting: Allergy & Immunology

## 2023-09-06 VITALS — BP 116/66 | HR 81 | Temp 98.0°F | Resp 14 | Wt 177.2 lb

## 2023-09-06 DIAGNOSIS — J4550 Severe persistent asthma, uncomplicated: Secondary | ICD-10-CM

## 2023-09-06 DIAGNOSIS — J302 Other seasonal allergic rhinitis: Secondary | ICD-10-CM | POA: Diagnosis not present

## 2023-09-06 DIAGNOSIS — R918 Other nonspecific abnormal finding of lung field: Secondary | ICD-10-CM

## 2023-09-06 DIAGNOSIS — J3089 Other allergic rhinitis: Secondary | ICD-10-CM

## 2023-09-06 NOTE — Progress Notes (Signed)
 FOLLOW UP  Date of Service/Encounter:  09/06/23   Assessment:   Moderate persistent asthma - controlled without a biologic at this point in time    Seasonal and perennial allergic rhinitis (indoor and outdoor molds, cat, dog, cockroach)   Chronic back pain - with a history of scoliosis (rods placement in 2010) and now on disability    History of ground glass opacities in 2016 - repeat chest CT markedly improved   Recent COVID-19 infection in August 2022   S/p bilateral knee replacement    Leg pain - felt to be secondary to Fasenra (planning to transition to [employer])  Plan/Recommendations:   1. Moderate persistent asthma, uncomplicated - Lung testing looked EXCELLENT today. - We can continue with the Symbicort.  - Information provided on Nucala. - Dupxient definitely has more arthralgias associated with it compared to the others, so I hate to try that one.   - Daily controller medication(s): Symbicort 160/4.41mcg two puffs TWICE DAILY and Singulair daily - Prior to physical activity: albuterol 2 puffs 10-15 minutes before physical activity. - Rescue medications: albuterol 4 puffs every 4-6 hours as needed or albuterol nebulizer one vial every 4-6 hours as needed - Changes during respiratory infections or worsening symptoms: CHANGE Symbicort to Breztri two puffs TWICE DAILY for one to two weeks. - Asthma control goals:  * Full participation in all desired activities (may need albuterol before activity) * Albuterol use two time or less a week on average (not counting use with activity) * Cough interfering with sleep two time or less a month * Oral steroids no more than once a year * No hospitalizations  2. Chronic rhinitis (weeds, indoor molds, outdoor molds, cat, dog, and cockroach) - Continue with fluticasone nasal spray 1-2 sprays per nostril daily. - Continue with Astelin 2 sprays per nostril up to twice daily as needed.  - Continue with cetirizine 10mg  daily. -  Continue with montelukast 10mg  daily.   3. Return in about 3 months (around 12/07/2023).    Subjective:   [name] is a 63 y.o. female presenting today for follow up of  Chief Complaint  Patient presents with   Follow-up    [name] has a history of the following: Patient Active Problem List   Diagnosis Date Noted   Family history of early CAD 08/24/2022   Mixed hyperlipidemia 08/24/2022   History of total knee arthroplasty, right 08/24/2022   Caregiver stress 08/24/2022   GERD (gastroesophageal reflux disease)    Esophageal dysphagia    Varicose veins of bilateral lower extremities with pain 01/02/2020   Seasonal and perennial allergic rhinitis 04/15/2018   Fusion of spine, thoracolumbar region 11/29/2016   Chronic insomnia 11/29/2016   Eosinophilia 03/29/2016   Chronic migraine w/o aura w/o status migrainosus, not intractable 03/14/2016   Arthritis of knee, degenerative 07/09/2015   Chronic low back pain 04/15/2015   Cough variant asthma 08/09/2014   Pulmonary infiltrates with eosinophilia (HCC) 08/09/2014    History obtained from: chart review and patient.  Discussed the use of AI scribe software for clinical note transcription with the patient and/or guardian, who gave verbal consent to proceed.  [name] is a 63 y.o. female presenting for a follow up visit. We last saw her on January 22nd, 2025. At that time, we treated her with DepoMedrol and we started her on Tezspire. We changed her from Symbicort to [place]. For her rhinitis, we continued with Flonase as well as Astelin and cetirizine and montelukast.  Since the last visit, she has done fairly well.   Asthma/Respiratory Symptom History: She has been managing her asthma with Symbicort, taking two puffs twice a day, which she finds effective and affordable. She has experienced asthma flare-ups but does feel her condition has worsened since discontinuing Fasenra. She previously found Fasenra effective but  discontinued it due to prolonged pain after injections. She also tried Tezspire, which caused significant discomfort, leading her to stop its use. She experienced significant discomfort, which she attributes to her previous experiences with artificial implants. Currently, she uses Breztri during flare-ups. She is interested in trying another biologic for treatment. [name] has preciously done extremely well to control her symptoms.   Allergic Rhinitis Symptom History: She remains on the Flonase as well as the Astelin and cetirizine and montelukast. This seems to be working well to control her rhinitis. She has never been on allergy shots.   Her husband's declining health significantly impacts her daily life. He requires day-to-day management due to drastic changes in blood pressure and cognitive difficulties. He is no longer on chemotherapy and is being considered for hospice care, adding to her stress.   Otherwise, there have been no changes to her past medical history, surgical history, family history, or social history.    Review of systems otherwise negative other than that mentioned in the HPI.    Objective:   Blood pressure 116/66, pulse 81, temperature 98 F (36.7 C), resp. rate 14, weight 177 lb 4 oz (80.4 kg), last menstrual period 08/30/2002, SpO2 95%. Body mass index is 28.61 kg/m.    Physical Exam Vitals reviewed.  Constitutional:      General: She is not in acute distress.    Appearance: She is well-developed. She is not ill-appearing or toxic-appearing.     Comments: Speaking in full sentences. Very talkative.   HENT:     Head: Normocephalic and atraumatic.     Right Ear: Tympanic membrane, ear canal and external ear normal.     Left Ear: Tympanic membrane, ear canal and external ear normal.     Nose: No nasal deformity, septal deviation, mucosal edema or rhinorrhea.     Right Turbinates: Enlarged, swollen and pale.     Left Turbinates: Enlarged, swollen and pale.      Right Sinus: No maxillary sinus tenderness or frontal sinus tenderness.     Left Sinus: No maxillary sinus tenderness or frontal sinus tenderness.     Comments: No nasal polyps.    Mouth/Throat:     Mouth: Mucous membranes are not pale and not dry.     Pharynx: Uvula midline.  Eyes:     General: Lids are normal. Allergic shiner present.        Right eye: No discharge.        Left eye: No discharge.     Conjunctiva/sclera: Conjunctivae normal.     Right eye: Right conjunctiva is not injected. No chemosis.    Left eye: Left conjunctiva is not injected. No chemosis.    Pupils: Pupils are equal, round, and reactive to light.  Cardiovascular:     Rate and Rhythm: Normal rate and regular rhythm.     Heart sounds: Normal heart sounds.  Pulmonary:     Effort: Pulmonary effort is normal. No tachypnea, accessory muscle usage or respiratory distress.     Breath sounds: No wheezing, rhonchi or rales.     Comments: She has expiratory wheezing isolated in the bilateral lung fields.  Chest:  Chest wall: No tenderness.  Musculoskeletal:     Comments: Scars over the bilateral knees.  Ambulating without a cane.  Lymphadenopathy:     Cervical: No cervical adenopathy.  Skin:    General: Skin is warm.     Capillary Refill: Capillary refill takes less than 2 seconds.     Coloration: Skin is not pale.     Findings: No abrasion, erythema, petechiae or rash. Rash is not papular, urticarial or vesicular.     Comments: No eczematous or urticarial lesions noted.  Neurological:     Mental Status: She is alert.  Psychiatric:        Behavior: Behavior is cooperative.      Diagnostic studies:    Spirometry: results normal (FEV1: 2.51/95%, FVC: 3.28/97%, FEV1/FVC: 77%).    Spirometry consistent with normal pattern.   Allergy Studies: none       Malachi Bonds, MD  [facility]

## 2023-09-06 NOTE — Patient Instructions (Addendum)
 1. Moderate persistent asthma, uncomplicated - Lung testing looked EXCELLENT today. - We can continue with the Symbicort.  - Information provided on Nucala. - Dupxient definitely has more arthralgias associated with it compared to the others, so I hate to try that one.   - Daily controller medication(s): Symbicort 160/4.65mcg two puffs TWICE DAILY and Singulair daily - Prior to physical activity: albuterol 2 puffs 10-15 minutes before physical activity. - Rescue medications: albuterol 4 puffs every 4-6 hours as needed or albuterol nebulizer one vial every 4-6 hours as needed - Changes during respiratory infections or worsening symptoms: CHANGE Symbicort to Breztri two puffs TWICE DAILY for one to two weeks. - Asthma control goals:  * Full participation in all desired activities (may need albuterol before activity) * Albuterol use two time or less a week on average (not counting use with activity) * Cough interfering with sleep two time or less a month * Oral steroids no more than once a year * No hospitalizations  2. Chronic rhinitis (weeds, indoor molds, outdoor molds, cat, dog, and cockroach) - Continue with fluticasone nasal spray 1-2 sprays per nostril daily. - Continue with Astelin 2 sprays per nostril up to twice daily as needed.  - Continue with cetirizine 10mg  daily. - Continue with montelukast 10mg  daily.   3. Return in about 3 months (around 12/07/2023).    Please inform us of any Emergency Department visits, hospitalizations, or changes in symptoms. Call us before going to the ED for breathing or allergy symptoms since we might be able to fit you in for a sick visit. Feel free to contact us anytime with any questions, problems, or concerns.  It was a pleasure to see you again today!  Websites that have reliable patient information: 1. American Academy of Asthma, Allergy, and Immunology: www.aaaai.org 2. Food Allergy Research and Education (FARE): foodallergy.org 3. Mothers of  Asthmatics: http://www.asthmacommunitynetwork.org 4. American College of Allergy, Asthma, and Immunology: www.acaai.org   COVID-19 Vaccine Information can be found at: [email or web address] For questions related to vaccine distribution or appointments, please email [email or web address] or call [phone].   We realize that you might be concerned about having an allergic reaction to the COVID19 vaccines. To help with that concern, WE ARE OFFERING THE COVID19 VACCINES IN OUR OFFICE! Ask the front desk for dates!     "Like" [place] on Facebook and Instagram for our latest updates!      A healthy democracy works best when [employer] participate! Make sure you are registered to vote! If you have moved or changed any of your contact information, you will need to get this updated before voting!  In some cases, you MAY be able to register to vote online: [email or web address]      We are partnering with [employer] to provide home spirometry devices. These are sent to your home and connect to your smart phone. The company will reach out to you (after you scan the code) and go through the process and estimate any insurance copayments, if applicable. Then we will read your spirometry readings remotely from our office to make sure that you are heading in the right direction!

## 2023-09-10 ENCOUNTER — Other Ambulatory Visit: Payer: Self-pay | Admitting: Allergy & Immunology

## 2023-09-12 ENCOUNTER — Telehealth: Payer: Self-pay | Admitting: *Deleted

## 2023-09-12 ENCOUNTER — Telehealth: Payer: Self-pay

## 2023-09-12 ENCOUNTER — Other Ambulatory Visit (HOSPITAL_COMMUNITY): Payer: Self-pay

## 2023-09-12 MED ORDER — NUCALA 100 MG/ML ~~LOC~~ SOAJ
100.0000 mg | SUBCUTANEOUS | 11 refills | Status: DC
  Filled 2023-09-28: qty 1, 28d supply, fill #0

## 2023-09-12 NOTE — Telephone Encounter (Addendum)
 Called patient and advised approval for Nucala will submit to [facility], [place] since it sounds like she may have LIS low copay for same and reach back out to her. Will schedule for start once delivery set

## 2023-09-12 NOTE — Telephone Encounter (Signed)
 Thanks, [name].

## 2023-09-12 NOTE — Telephone Encounter (Signed)
 Test claim sent to [facility]

## 2023-09-12 NOTE — Telephone Encounter (Signed)
-----   Message from [name] sent at 09/06/2023 12:21 PM EDT ----- Patient wants to try Nucala.Marland KitchenMarland KitchenMarland Kitchen

## 2023-09-14 ENCOUNTER — Other Ambulatory Visit: Payer: Self-pay

## 2023-09-20 DIAGNOSIS — G894 Chronic pain syndrome: Secondary | ICD-10-CM | POA: Diagnosis not present

## 2023-09-20 DIAGNOSIS — M5106 Intervertebral disc disorders with myelopathy, lumbar region: Secondary | ICD-10-CM | POA: Diagnosis not present

## 2023-09-25 NOTE — Progress Notes (Signed)
 No answer to calls- [name] notified

## 2023-09-28 ENCOUNTER — Other Ambulatory Visit (HOSPITAL_COMMUNITY): Payer: Self-pay

## 2023-09-28 ENCOUNTER — Other Ambulatory Visit: Payer: Self-pay

## 2023-10-18 DIAGNOSIS — M5106 Intervertebral disc disorders with myelopathy, lumbar region: Secondary | ICD-10-CM | POA: Diagnosis not present

## 2023-10-18 DIAGNOSIS — G894 Chronic pain syndrome: Secondary | ICD-10-CM | POA: Diagnosis not present

## 2023-10-25 ENCOUNTER — Other Ambulatory Visit: Payer: Self-pay

## 2023-10-25 DIAGNOSIS — Z1211 Encounter for screening for malignant neoplasm of colon: Secondary | ICD-10-CM

## 2023-11-14 ENCOUNTER — Encounter: Payer: Self-pay | Admitting: Allergy & Immunology

## 2023-11-16 DIAGNOSIS — M47812 Spondylosis without myelopathy or radiculopathy, cervical region: Secondary | ICD-10-CM | POA: Diagnosis not present

## 2023-11-16 DIAGNOSIS — G894 Chronic pain syndrome: Secondary | ICD-10-CM | POA: Diagnosis not present

## 2023-11-16 DIAGNOSIS — M545 Low back pain, unspecified: Secondary | ICD-10-CM | POA: Diagnosis not present

## 2023-11-16 DIAGNOSIS — M542 Cervicalgia: Secondary | ICD-10-CM | POA: Diagnosis not present

## 2023-11-16 DIAGNOSIS — Z1330 Encounter for screening examination for mental health and behavioral disorders, unspecified: Secondary | ICD-10-CM | POA: Diagnosis not present

## 2023-11-30 DIAGNOSIS — Z1211 Encounter for screening for malignant neoplasm of colon: Secondary | ICD-10-CM | POA: Diagnosis not present

## 2023-12-07 ENCOUNTER — Encounter: Payer: Self-pay | Admitting: Family Medicine

## 2023-12-09 ENCOUNTER — Other Ambulatory Visit: Payer: Self-pay | Admitting: Allergy & Immunology

## 2023-12-09 LAB — COLOGUARD: COLOGUARD: NEGATIVE

## 2023-12-11 ENCOUNTER — Ambulatory Visit: Payer: Self-pay | Admitting: Family Medicine

## 2023-12-18 DIAGNOSIS — M545 Low back pain, unspecified: Secondary | ICD-10-CM | POA: Diagnosis not present

## 2023-12-18 DIAGNOSIS — G894 Chronic pain syndrome: Secondary | ICD-10-CM | POA: Diagnosis not present

## 2023-12-20 ENCOUNTER — Ambulatory Visit: Admitting: Family Medicine

## 2023-12-20 ENCOUNTER — Ambulatory Visit
Admission: RE | Admit: 2023-12-20 | Discharge: 2023-12-20 | Disposition: A | Discharge status: Home / Self Care | Source: Ambulatory Visit | Attending: Family Medicine | Admitting: Family Medicine

## 2023-12-20 ENCOUNTER — Other Ambulatory Visit (HOSPITAL_COMMUNITY)
Admission: RE | Admit: 2023-12-20 | Discharge: 2023-12-20 | Disposition: A | Discharge status: Home / Self Care | Source: Ambulatory Visit | Attending: Family Medicine | Admitting: Family Medicine

## 2023-12-20 ENCOUNTER — Encounter: Payer: Self-pay | Admitting: Family Medicine

## 2023-12-20 VITALS — BP 133/71 | HR 60 | Temp 98.6°F | Ht 66.0 in | Wt 160.4 lb

## 2023-12-20 DIAGNOSIS — Z124 Encounter for screening for malignant neoplasm of cervix: Secondary | ICD-10-CM

## 2023-12-20 DIAGNOSIS — Z1231 Encounter for screening mammogram for malignant neoplasm of breast: Secondary | ICD-10-CM

## 2023-12-20 DIAGNOSIS — E782 Mixed hyperlipidemia: Secondary | ICD-10-CM | POA: Diagnosis not present

## 2023-12-20 DIAGNOSIS — Z01419 Encounter for gynecological examination (general) (routine) without abnormal findings: Secondary | ICD-10-CM | POA: Diagnosis not present

## 2023-12-20 DIAGNOSIS — J45901 Unspecified asthma with (acute) exacerbation: Secondary | ICD-10-CM | POA: Diagnosis not present

## 2023-12-20 DIAGNOSIS — F4321 Adjustment disorder with depressed mood: Secondary | ICD-10-CM | POA: Diagnosis not present

## 2023-12-20 DIAGNOSIS — Z1151 Encounter for screening for human papillomavirus (HPV): Secondary | ICD-10-CM | POA: Insufficient documentation

## 2023-12-20 DIAGNOSIS — Z90711 Acquired absence of uterus with remaining cervical stump: Secondary | ICD-10-CM

## 2023-12-20 DIAGNOSIS — Z0000 Encounter for general adult medical examination without abnormal findings: Secondary | ICD-10-CM

## 2023-12-20 DIAGNOSIS — Z0001 Encounter for general adult medical examination with abnormal findings: Secondary | ICD-10-CM | POA: Diagnosis not present

## 2023-12-20 DIAGNOSIS — Z23 Encounter for immunization: Secondary | ICD-10-CM | POA: Diagnosis not present

## 2023-12-20 MED ORDER — PREDNISONE 20 MG PO TABS
40.0000 mg | ORAL_TABLET | Freq: Every day | ORAL | 0 refills | Status: AC
Start: 2023-12-20 — End: 2023-12-23

## 2023-12-20 MED ORDER — DOXYCYCLINE HYCLATE 100 MG PO TABS
100.0000 mg | ORAL_TABLET | Freq: Two times a day (BID) | ORAL | 0 refills | Status: DC
Start: 2023-12-20 — End: 2023-12-27

## 2023-12-20 MED ORDER — MIRTAZAPINE 7.5 MG PO TABS
7.5000 mg | ORAL_TABLET | Freq: Every day | ORAL | 0 refills | Status: DC
Start: 2023-12-20 — End: 2024-01-26

## 2023-12-20 MED ORDER — PREDNISONE 20 MG PO TABS
40.0000 mg | ORAL_TABLET | Freq: Every day | ORAL | 0 refills | Status: DC
Start: 2023-12-20 — End: 2023-12-20

## 2023-12-20 MED ORDER — DOXYCYCLINE HYCLATE 100 MG PO TABS
100.0000 mg | ORAL_TABLET | Freq: Two times a day (BID) | ORAL | 0 refills | Status: DC
Start: 2023-12-20 — End: 2023-12-20

## 2023-12-20 NOTE — Patient Instructions (Signed)
 Use the antibiotics and steroid if no improvement over next few days or if you get abruptly worse New med Mirtazapine sent to pharmacy. Take about 2 hours before bedtime.  Should help with sleep, anxiety and depression.

## 2023-12-20 NOTE — Progress Notes (Signed)
 [name] is a 63 y.o. female presents to office today for annual physical exam examination.    Concerns today include: 1.  Grief reaction Continues to grieve the loss of her husband.  He passed away from complications of cirrhosis.  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  Since he passed away she has started back working at [employer] 2 to 3 days/week in [place].  She reports undulating depressive symptoms.  Her support system is fair.  [name] lives in the home but works during the day.  She reports difficulty with sleep and frequently pacing the floor.  She has had loss of appetite and a subsequent 10 pound weight loss.  2.  Cough She has had over 1 week history of cough and wheezing.  She has been utilizing her inhalers as directed and all medications as prescribed by her pulmonologist/allergist.  No hemoptysis, fevers, chills  Occupation: Works at [employer], Marital status: Widowed, Substance use: None Health Maintenance Due  Topic Date Due   COVID-19 Vaccine (1) Never done   HIV Screening  Never done   MAMMOGRAM  10/22/2021   Refills needed today: None  Immunization History  Administered Date(s) Administered   Influenza, Seasonal, Injecte, Preservative Fre 05/28/2013, 03/29/2023   Influenza,inj,Quad PF,6+ Mos 06/07/2017, 04/16/2018   PNEUMOCOCCAL CONJUGATE-20 12/20/2023   Pneumococcal Conjugate-13 11/29/2017   Td 06/28/2011   Tdap 06/29/2011, 12/20/2023   Past Medical History:  Diagnosis Date   Arthritis    Spine, R knee   Asthma    Back pain    Rod in back   GERD (gastroesophageal reflux disease)    History of blood transfusion    Back surgery   hx Cough 09/03/2014   Followed in Pulmonary clinic/ [facility]/ [place] - sinus CT 09/08/2014> Mild mucosal thickening involves the paranasal sinuses  - Eos 1.2  08/06/14 - Allergy  profile 11/27/14 >  Eos 1.0/ IgE  41 with neg RAST     Mixed hyperlipidemia 08/24/2022   Social History   Socioeconomic History   Marital status:  Significant Other    Spouse name: [name]   Number of children: 2   Years of education: 12   Highest education level: Not on file  Occupational History   Occupation: disabled    Comment: back  Tobacco Use   Smoking status: Never   Smokeless tobacco: Never  Vaping Use   Vaping status: Never Used  Substance and Sexual Activity   Alcohol use: No   Drug use: No   Sexual activity: Yes    Birth control/protection: Surgical  Other Topics Concern   Not on file  Social History Narrative   Lives with [name] - 27 years   [name] has end stage colon cancer   Two sons/ grandchildren   Disabled from back pain/had scoliosis. Has had back reconstruction.    Dr. Velton doctor.    Social Drivers of Corporate investment banker Strain: Low Risk  (11/16/2023)   Received from [facility]   Overall Financial Resource Strain (CARDIA)    Difficulty of Paying Living Expenses: Not hard at all  Food Insecurity: No Food Insecurity (11/16/2023)   Received from [facility]   Hunger Vital Sign    Within the past 12 months, you worried that your food would run out before you got the money to buy more.: Never true    Within the past 12 months, the food you bought just didn't last and you didn't have money to get more.:  Never true  Transportation Needs: No Transportation Needs (11/16/2023)   Received from [facility]   PRAPARE - Transportation    Lack of Transportation (Medical): No    Lack of Transportation (Non-Medical): No  Physical Activity: Sufficiently Active (10/11/2022)   Exercise Vital Sign    Days of Exercise per Week: 3 days    Minutes of Exercise per Session: 60 min  Stress: No Stress Concern Present (10/11/2022)   [employer] of Occupational Health - Occupational Stress Questionnaire    Feeling of Stress : Not at all  Social Connections: Moderately Isolated (10/11/2022)   Social Connection and Isolation Panel    Frequency of Communication with Friends and Family: More than three  times a week    Frequency of Social Gatherings with Friends and Family: More than three times a week    Attends Religious Services: More than 4 times per year    Active Member of [employer] or Organizations: No    Attends Banker Meetings: Never    Marital Status: Divorced  Catering manager Violence: Not At Risk (10/11/2022)   Humiliation, Afraid, Rape, and Kick questionnaire    Fear of Current or Ex-Partner: No    Emotionally Abused: No    Physically Abused: No    Sexually Abused: No   Past Surgical History:  Procedure Laterality Date   ABDOMINAL HYSTERECTOMY  2004   cysts, pelvic pain   BACK SURGERY  2010   rod   BACK SURGERY  2014   broken hardware removal   BIOPSY  05/19/2021   Procedure: BIOPSY;  Surgeon: Golda Claudis PENNER, MD;  [facility]: [facility];  Service: Endoscopy;;   COLONOSCOPY WITH PROPOFOL  N/A 05/19/2021   Procedure: COLONOSCOPY WITH PROPOFOL ;  Surgeon: Golda Claudis PENNER, MD;  [facility]: [facility];  Service: Endoscopy;  Laterality: N/A;  10:10   ESOPHAGEAL DILATION N/A 05/19/2021   Procedure: ESOPHAGEAL DILATION;  Surgeon: Golda Claudis PENNER, MD;  [facility]: [facility];  Service: Endoscopy;  Laterality: N/A;   ESOPHAGOGASTRODUODENOSCOPY (EGD) WITH PROPOFOL  N/A 05/19/2021   Procedure: ESOPHAGOGASTRODUODENOSCOPY (EGD) WITH PROPOFOL ;  Surgeon: Golda Claudis PENNER, MD;  [facility]: [facility];  Service: Endoscopy;  Laterality: N/A;   KNEE ARTHROSCOPY Right 2017   KNEE ARTHROSCOPY Left 2022   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   Procedure: TOTAL KNEE ARTHROPLASTY;  Surgeon: Shari Sieving, MD;  Location: [facility];  Service: Orthopedics;  Laterality: Left;   TOTAL KNEE ARTHROPLASTY Right 01/28/2022   Procedure: TOTAL KNEE ARTHROPLASTY;  Surgeon: Shari Sieving, MD;  Location: [facility];  Service: Orthopedics;  Laterality: Right;   Family History  Problem Relation Age of Onset   Emphysema Maternal Grandmother        never smoker   COPD Maternal Grandmother     Emphysema Maternal Uncle        smoked   COPD Maternal Uncle    Arthritis Mother    Diabetes Mother    Hyperlipidemia Mother    Hypertension Mother    Early death Father 74       MVA   Asthma Sister    Asthma Brother    Heart disease Maternal Grandfather     Current Outpatient Medications:    acetaminophen  (TYLENOL ) 500 MG tablet, Take 1,000 mg by mouth every 8 (eight) hours as needed for moderate pain., Disp: , Rfl:    albuterol  (VENTOLIN  HFA) 108 (90 Base) MCG/ACT inhaler, INHALE 2 PUFFS into THE lungs EVERY 4 HOURS AS NEEDED FOR wheezing  OR SHORTNESS OF BREATH, Disp: 8.5 g, Rfl: 2   azelastine  (ASTELIN ) 0.1 % nasal spray, INSTILL 2 SPRAYS IN EACH NOSTRIL TWICE DAILY AS DIRECTED, Disp: 30 mL, Rfl: 5   budesonide -glycopyrrolate -formoterol  (BREZTRI  AEROSPHERE) 160-9-4.8 MCG/ACT AERO inhaler, INHALE TWO PUFFS INTO THE LUNGS IN THE MORNING & AT BEDTIME, Disp: 10.7 g, Rfl: 0   celecoxib  (CELEBREX ) 200 MG capsule, Take by mouth 2 (two) times daily., Disp: , Rfl:    cetirizine  (ZYRTEC ) 10 MG tablet, Take 1 tablet (10 mg total) by mouth daily., Disp: 30 tablet, Rfl: 5   EPINEPHrine  0.3 mg/0.3 mL IJ SOAJ injection, Inject 0.3 mg into the muscle as needed for anaphylaxis., Disp: , Rfl:    fluticasone  (FLONASE ) 50 MCG/ACT nasal spray, INSTILL 2 SPRAYS IN EACH NOSTRIL EVERY MORNING AND AT BEDTIME, Disp: 16 g, Rfl: 5   hydrOXYzine  (ATARAX /VISTARIL ) 50 MG tablet, TAKE 1/2 TO 1 TABLET BY MOUTH EVERY 8 HOURS AS NEEDED FOR ANXIETY, Disp: 30 tablet, Rfl: prn   Mepolizumab  (NUCALA ) 100 MG/ML SOAJ, Inject 1 mL (100 mg total) into the skin every 28 (twenty-eight) days., Disp: 1 mL, Rfl: 11   mirtazapine (REMERON) 7.5 MG tablet, Take 1 tablet (7.5 mg total) by mouth at bedtime., Disp: 90 tablet, Rfl: 0   montelukast  (SINGULAIR ) 10 MG tablet, TAKE 1 TABLET BY MOUTH AT BEDTIME, Disp: 30 tablet, Rfl: 3   morphine  (MSIR) 15 MG tablet, Take 15 mg by mouth every 8 (eight) hours as needed. Back Pain, Disp: , Rfl:     Multiple Vitamins-Minerals (MULTIVITAMIN WITH MINERALS) tablet, Take 1 tablet by mouth daily., Disp: , Rfl:    pantoprazole  (PROTONIX ) 40 MG tablet, TAKE 1 TABLET BY MOUTH DAILY, Disp: 30 tablet, Rfl: 5   Propylene Glycol (SYSTANE BALANCE) 0.6 % SOLN, Place 1 drop into both eyes as needed (dry eyes)., Disp: , Rfl:    SYMBICORT  160-4.5 MCG/ACT inhaler, INHALE TWO PUFFS BY MOUTH TWICE DAILY, Disp: 10.2 g, Rfl: 5   tiZANidine  (ZANAFLEX ) 4 MG tablet, Take 1 tablet (4 mg total) by mouth every 8 (eight) hours as needed for muscle spasms., Disp: 30 tablet, Rfl: 0   doxycycline  (VIBRA -TABS) 100 MG tablet, Take 1 tablet (100 mg total) by mouth 2 (two) times daily for 7 days., Disp: 14 tablet, Rfl: 0   predniSONE  (DELTASONE ) 20 MG tablet, Take 2 tablets (40 mg total) by mouth daily with breakfast for 3 days., Disp: 6 tablet, Rfl: 0  Current Facility-Administered Medications:    Benralizumab  SOSY 30 mg, 30 mg, Subcutaneous, Q28 days, Iva Marty Saltness, MD, 30 mg at 09/07/22 1106  No Known Allergies   ROS: Review of Systems Pertinent items noted in HPI and remainder of comprehensive ROS otherwise negative.    Physical exam BP 133/71   Pulse 60   Temp 98.6 F (37 C)   Ht 5' 6 (1.676 m)   Wt 160 lb 6.4 oz (72.8 kg)   LMP 08/30/2002 (Approximate)   SpO2 93%   BMI 25.89 kg/m  General appearance: alert, cooperative, appears stated age, and no distress Head: Normocephalic, without obvious abnormality, atraumatic Eyes: negative findings: lids and lashes normal, conjunctivae and sclerae normal, corneas clear, and pupils equal, round, reactive to light and accomodation Ears: normal TM's and external ear canals both ears Nose: Nares normal. Septum midline. Mucosa normal. No drainage or sinus tenderness. Throat: lips, mucosa, and tongue normal; teeth and gums normal Neck: no adenopathy, no carotid bruit, supple, symmetrical, trachea midline, and thyroid  not enlarged, symmetric,  no  tenderness/mass/nodules Back: symmetric, no curvature. ROM normal. No CVA tenderness. Lungs: Global expiratory wheezes.  Coughing intermittently.  Normal work of breathing on room air Heart: regular rate and rhythm, S1, S2 normal, no murmur, click, rub or gallop Abdomen: soft, non-tender; bowel sounds normal; no masses,  no organomegaly Pelvic: rectovaginal septum normal, vagina normal without discharge, and cervix surgically absent.  No abnormalities within the vaginal vault appreciated.  External vagina atrophic Extremities: extremities normal, atraumatic, no cyanosis or edema Pulses: 2+ and symmetric Skin: Small abrasion on left anterior thigh Lymph nodes: Cervical, supraclavicular, and axillary nodes normal. Neurologic: Grossly normal      03/29/2023   10:09 AM 03/29/2023    9:57 AM 10/11/2022   12:00 PM  Depression screen PHQ 2/9  Decreased Interest  0 0  Down, Depressed, Hopeless  0 0  PHQ - 2 Score  0 0  Altered sleeping 1 0 0  Tired, decreased energy 1 0 0  Change in appetite  0 0  Feeling bad or failure about yourself  1 0 0  Trouble concentrating  0 0  Moving slowly or fidgety/restless  0 0  Suicidal thoughts  0 0  PHQ-9 Score  0 0  Difficult doing work/chores  Not difficult at all Not difficult at all      03/29/2023    9:57 AM 08/24/2022    9:03 AM 08/23/2021    8:08 AM 04/09/2021    4:34 PM  GAD 7 : Generalized Anxiety Score  Nervous, Anxious, on Edge 1 1 1  0  Control/stop worrying 0 0 0 0  Worry too much - different things 1 1 1 1   Trouble relaxing 1 1 1 2   Restless 0 0 1 1  Easily annoyed or irritable 0 0 0 0  Afraid - awful might happen 0 0 0 0  Total GAD 7 Score 3 3 4 4   Anxiety Difficulty Not difficult at all Somewhat difficult Somewhat difficult Not difficult at all     Assessment/ Plan: Donny LELON Grebe here for annual physical exam.   Annual physical exam  Screening for malignant neoplasm of cervix - Plan: Cytology - PAP  History of partial  hysterectomy  Encounter for Prevnar pneumococcal vaccination - Plan: Pneumococcal conjugate vaccine 20-valent  Grief - Plan: mirtazapine (REMERON) 7.5 MG tablet  Mixed hyperlipidemia - Plan: Lipid Panel, Basic Metabolic Panel  Exacerbation of persistent asthma, unspecified asthma severity - Plan: predniSONE  (DELTASONE ) 20 MG tablet, doxycycline  (VIBRA -TABS) 100 MG tablet, DISCONTINUED: doxycycline  (VIBRA -TABS) 100 MG tablet, DISCONTINUED: predniSONE  (DELTASONE ) 20 MG tablet  Pap smear completed.  She has a history of partial hysterectomy and no cervix appreciated on exam.  No further Pap smears are required.  Mammogram performed today.  She had tetanus and pneumococcal vaccination done today  Start mirtazapine for sleep and stabilization of mood.  Will plan re check in 6 weeks  Lipid and metabolic panel collected given hyperlipidemia  Evidence of asthma exacerbation so I am going to prednisone  burst her and start her on oral antibiotics.  Pulmonary exam was notable for global expiratory wheezes with coughing.  Continue inhaled medications as prescribed by pulmonologist/allergist  Counseled on healthy lifestyle choices, including diet (rich in fruits, vegetables and lean meats and low in salt and simple carbohydrates) and exercise (at least 30 minutes of moderate physical activity daily).  Patient to follow up 6 weeks  Keairra Bardon M. Jolinda, DO

## 2023-12-21 LAB — BASIC METABOLIC PANEL WITH GFR
BUN/Creatinine Ratio: 16 (ref 12–28)
BUN: 12 mg/dL (ref 8–27)
CO2: 19 mmol/L — ABNORMAL LOW (ref 20–29)
Calcium: 9 mg/dL (ref 8.7–10.3)
Chloride: 104 mmol/L (ref 96–106)
Creatinine, Ser: 0.77 mg/dL (ref 0.57–1.00)
Glucose: 77 mg/dL (ref 70–99)
Potassium: 3.6 mmol/L (ref 3.5–5.2)
Sodium: 145 mmol/L — ABNORMAL HIGH (ref 134–144)
eGFR: 88 mL/min/{1.73_m2} (ref >59)

## 2023-12-21 LAB — LIPID PANEL
Chol/HDL Ratio: 3.9 ratio (ref 0.0–4.4)
Cholesterol, Total: 147 mg/dL (ref 100–199)
HDL: 38 mg/dL — ABNORMAL LOW (ref >39)
LDL Chol Calc (NIH): 75 mg/dL (ref 0–99)
Triglycerides: 201 mg/dL — ABNORMAL HIGH (ref 0–149)
VLDL Cholesterol Cal: 34 mg/dL (ref 5–40)

## 2023-12-22 ENCOUNTER — Ambulatory Visit: Payer: Self-pay | Admitting: Family Medicine

## 2023-12-22 LAB — CYTOLOGY - PAP
Adequacy: ABSENT
Comment: NEGATIVE
Diagnosis: NEGATIVE
High risk HPV: NEGATIVE

## 2023-12-25 ENCOUNTER — Other Ambulatory Visit: Payer: Self-pay | Admitting: Family Medicine

## 2023-12-25 DIAGNOSIS — R928 Other abnormal and inconclusive findings on diagnostic imaging of breast: Secondary | ICD-10-CM

## 2023-12-27 ENCOUNTER — Other Ambulatory Visit: Payer: Self-pay

## 2023-12-27 ENCOUNTER — Encounter: Payer: Self-pay | Admitting: Allergy & Immunology

## 2023-12-27 ENCOUNTER — Ambulatory Visit (INDEPENDENT_AMBULATORY_CARE_PROVIDER_SITE_OTHER): Admitting: Allergy & Immunology

## 2023-12-27 VITALS — BP 130/72 | HR 68 | Temp 97.2°F | Resp 18 | Ht 65.35 in | Wt 166.0 lb

## 2023-12-27 DIAGNOSIS — J4550 Severe persistent asthma, uncomplicated: Secondary | ICD-10-CM | POA: Diagnosis not present

## 2023-12-27 DIAGNOSIS — J3089 Other allergic rhinitis: Secondary | ICD-10-CM

## 2023-12-27 DIAGNOSIS — J302 Other seasonal allergic rhinitis: Secondary | ICD-10-CM

## 2023-12-27 MED ORDER — MEPOLIZUMAB 100 MG ~~LOC~~ SOLR
100.0000 mg | SUBCUTANEOUS | Status: DC
Start: 2023-12-27 — End: 2024-05-06
  Administered 2023-12-27: 100 mg via SUBCUTANEOUS

## 2023-12-27 NOTE — Progress Notes (Signed)
 Immunotherapy   Patient Details  Name: [name] MRN: [id number] Date of Birth: 11/11/1960  12/27/2023  [name] started injections for  Nucala  Following schedule: Every twenty eight days.  Frequency:Every four weeks.  Epi-Pen:Not needed.  Consent signed previously and patient instructions given. Patient sat in room one for fifteen minutes without an issue.    [name] 12/27/2023, 11:40 AM

## 2023-12-27 NOTE — Addendum Note (Signed)
 Addended by: MENDEZ-MUNGARAY, Twanisha Foulk M on: 12/27/2023 05:04 PM   Modules accepted: Orders

## 2023-12-27 NOTE — Patient Instructions (Addendum)
 1. Moderate persistent asthma, uncomplicated - Lung testing looked STABLE today. - Nucala  sample given again. - Call [phone] to her your Nucala  set up again.  - Daily controller medication(s): Symbicort  160/4.29mcg two puffs TWICE DAILY and Singulair  daily and Nucala  monthly  - Prior to physical activity: albuterol  2 puffs 10-15 minutes before physical activity. - Rescue medications: albuterol  4 puffs every 4-6 hours as needed or albuterol  nebulizer one vial every 4-6 hours as needed - Changes during respiratory infections or worsening symptoms: CHANGE Symbicort  to Breztri  two puffs TWICE DAILY for one to two weeks. - Asthma control goals:  * Full participation in all desired activities (may need albuterol  before activity) * Albuterol  use two time or less a week on average (not counting use with activity) * Cough interfering with sleep two time or less a month * Oral steroids no more than once a year * No hospitalizations  2. Chronic rhinitis (weeds, indoor molds, outdoor molds, cat, dog, and cockroach) - Continue with fluticasone  nasal spray 1-2 sprays per nostril daily. - Continue with Astelin  2 sprays per nostril up to twice daily as needed.  - Continue with cetirizine  10mg  daily. - Continue with montelukast  10mg  daily.  - Think about allergy  shots for long-term control.   3. Return in about 6 months (around 06/28/2024). You can have the follow up appointment with Dr. Iva or a Nurse Practicioner (our Nurse Practitioners are excellent and always have Physician oversight!).    Please inform us  of any Emergency Department visits, hospitalizations, or changes in symptoms. Call us  before going to the ED for breathing or allergy  symptoms since we might be able to fit you in for a sick visit. Feel free to contact us  anytime with any questions, problems, or concerns.  It was a pleasure to see you again today!  Websites that have reliable patient information: 1. American Academy of  Asthma, Allergy , and Immunology: www.aaaai.org 2. Food Allergy  Research and Education (FARE): foodallergy.org 3. Mothers of Asthmatics: http://www.asthmacommunitynetwork.org 4. American College of Allergy , Asthma, and Immunology: www.acaai.org      "Like" us  on Facebook and Instagram for our latest updates!      A healthy democracy works best when [employer] participate! Make sure you are registered to vote! If you have moved or changed any of your contact information, you will need to get this updated before voting! Scan the QR codes below to learn more!        Allergy  Shots  Allergies are the result of a chain reaction that starts in the immune system. Your immune system controls how your body defends itself. For instance, if you have an allergy  to pollen, your immune system identifies pollen as an invader or allergen. Your immune system overreacts by producing antibodies called Immunoglobulin E (IgE). These antibodies travel to cells that release chemicals, causing an allergic reaction.  The concept behind allergy  immunotherapy, whether it is received in the form of shots or tablets, is that the immune system can be desensitized to specific allergens that trigger allergy  symptoms. Although it requires time and patience, the payback can be long-term relief. Allergy  injections contain a dilute solution of those substances that you are allergic to based upon your skin testing and allergy  history.   How Do Allergy  Shots Work?  Allergy  shots work much like a vaccine. Your body responds to injected amounts of a particular allergen given in increasing doses, eventually developing a resistance and tolerance to it. Allergy  shots can lead to decreased, minimal  or no allergy  symptoms.  There generally are two phases: build-up and maintenance. Build-up often ranges from three to six months and involves receiving injections with increasing amounts of the allergens. The shots are typically given  once or twice a week, though more rapid build-up schedules are sometimes used.  The maintenance phase begins when the most effective dose is reached. This dose is different for each person, depending on how allergic you are and your response to the build-up injections. Once the maintenance dose is reached, there are longer periods between injections, typically two to four weeks.  Occasionally doctors give cortisone-type shots that can temporarily reduce allergy  symptoms. These types of shots are different and should not be confused with allergy  immunotherapy shots.  Who Can Be Treated with Allergy  Shots?  Allergy  shots may be a good treatment approach for people with allergic rhinitis (hay fever), allergic asthma, conjunctivitis (eye allergy ) or stinging insect allergy .   Before deciding to begin allergy  shots, you should consider:   The length of allergy  season and the severity of your symptoms  Whether medications and/or changes to your environment can control your symptoms  Your desire to avoid long-term medication use  Time: allergy  immunotherapy requires a major time commitment  Cost: may vary depending on your insurance coverage  Allergy  shots for children age 52 and older are effective and often well tolerated. They might prevent the onset of new allergen sensitivities or the progression to asthma.  Allergy  shots are not started on patients who are pregnant but can be continued on patients who become pregnant while receiving them. In some patients with other medical conditions or who take certain common medications, allergy  shots may be of risk. It is important to mention other medications you talk to your allergist.   What are the two types of build-ups offered:   RUSH or Rapid Desensitization -- one day of injections lasting from 8:30-4:30pm, injections every 1 hour.  Approximately half of the build-up process is completed in that one day.  The following week, normal build-up is  resumed, and this entails ~16 visits either weekly or twice weekly, until reaching your "maintenance dose" which is continued weekly until eventually getting spaced out to every month for a duration of 3 to 5 years. The regular build-up appointments are nurse visits where the injections are administered, followed by required monitoring for 30 minutes.    Traditional build-up -- weekly visits for 6 -12 months until reaching "maintenance dose", then continue weekly until eventually spacing out to every 4 weeks as above. At these appointments, the injections are administered, followed by required monitoring for 30 minutes.     Either way is acceptable, and both are equally effective. With the rush protocol, the advantage is that less time is spent here for injections overall AND you would also reach maintenance dosing faster (which is when the clinical benefit starts to become more apparent). Not everyone is a candidate for rapid desensitization.   IF we proceed with the RUSH protocol, there are premedications which must be taken the day before and the day after the rush only (this includes antihistamines, steroids, and Singulair ).  After the rush day, no prednisone  or Singulair  is required, and we just recommend antihistamines taken on your injection day.  What Is An Estimate of the Costs?  If you are interested in starting allergy  injections, please check with your insurance company about your coverage for both allergy  vial sets and allergy  injections.  Please do so prior to  making the appointment to start injections.  The following are CPT codes to give to your insurance company. These are the amounts we BILL to the insurance company, but the amount YOU WILL PAY and WE RECEIVE IS SUBSTANTIALLY LESS and depends on the contracts we have with different insurance companies.   Amount Billed to Insurance One allergy  vial set  CPT 95165   $ 1200     Two allergy  vial set  CPT 95165   $ 2400     Three allergy   vial set  CPT 95165   $ 3600     One injection   CPT 95115   $ 35  Two injections   CPT 95117   $ 40 RUSH (Rapid Desensitization) CPT 95180 x 8 hours $500/hour  Regarding the allergy  injections, your co-pay may or may not apply with each injection, so please confirm this with your insurance company. When you start allergy  injections, 1 or 2 sets of vials are made based on your allergies.  Not all patients can be on one set of vials. A set of vials lasts 6 months to a year depending on how quickly you can proceed with your build-up of your allergy  injections. Vials are personalized for each patient depending on their specific allergens.  How often are allergy  injection given during the build-up period?   Injections are given at least weekly during the build-up period until your maintenance dose is achieved. Per the doctor's discretion, you may have the option of getting allergy  injections two times per week during the build-up period. However, there must be at least 48 hours between injections. The build-up period is usually completed within 6-12 months depending on your ability to schedule injections and for adjustments for reactions. When maintenance dose is reached, your injection schedule is gradually changed to every two weeks and later to every three weeks. Injections will then continue every 4 weeks. Usually, injections are continued for a total of 3-5 years.   When Will I Feel Better?  Some may experience decreased allergy  symptoms during the build-up phase. For others, it may take as long as 12 months on the maintenance dose. If there is no improvement after a year of maintenance, your allergist will discuss other treatment options with you.  If you aren't responding to allergy  shots, it may be because there is not enough dose of the allergen in your vaccine or there are missing allergens that were not identified during your allergy  testing. Other reasons could be that there are high levels of  the allergen in your environment or major exposure to non-allergic triggers like tobacco smoke.  What Is the Length of Treatment?  Once the maintenance dose is reached, allergy  shots are generally continued for three to five years. The decision to stop should be discussed with your allergist at that time. Some people may experience a permanent reduction of allergy  symptoms. Others may relapse and a longer course of allergy  shots can be considered.  What Are the Possible Reactions?  The two types of adverse reactions that can occur with allergy  shots are local and systemic. Common local reactions include very mild redness and swelling at the injection site, which can happen immediately or several hours after. Report a delayed reaction from your last injection. These include arm swelling or runny nose, watery eyes or cough that occurs within 12-24 hours after injection. A systemic reaction, which is less common, affects the entire body or a particular body system. They are usually mild  and typically respond quickly to medications. Signs include increased allergy  symptoms such as sneezing, a stuffy nose or hives.   Rarely, a serious systemic reaction called anaphylaxis can develop. Symptoms include swelling in the throat, wheezing, a feeling of tightness in the chest, nausea or dizziness. Most serious systemic reactions develop within 30 minutes of allergy  shots. This is why it is strongly recommended you wait in your doctor's office for 30 minutes after your injections. Your allergist is trained to watch for reactions, and his or her staff is trained and equipped with the proper medications to identify and treat them.   Report to the nurse immediately if you experience any of the following symptoms: swelling, itching or redness of the skin, hives, watery eyes/nose, breathing difficulty, excessive sneezing, coughing, stomach pain, diarrhea, or light headedness. These symptoms may occur within 15-20 minutes  after injection and may require medication.   Who Should Administer Allergy  Shots?  The preferred location for receiving shots is your prescribing allergist's office. Injections can sometimes be given at another facility where the physician and staff are trained to recognize and treat reactions, and have received instructions by your prescribing allergist.  What if I am late for an injection?   Injection dose will be adjusted depending upon how many days or weeks you are late for your injection.   What if I am sick?   Please report any illness to the nurse before receiving injections. She may adjust your dose or postpone injections depending on your symptoms. If you have fever, flu, sinus infection or chest congestion it is best to postpone allergy  injections until you are better. Never get an allergy  injection if your asthma is causing you problems. If your symptoms persist, seek out medical care to get your health problem under control.  What If I am or Become Pregnant:  Women that become pregnant should schedule an appointment with [facility] before receiving any further allergy  injections.

## 2023-12-27 NOTE — Progress Notes (Signed)
 FOLLOW UP  Date of Service/Encounter:  12/27/23   Assessment:   Moderate persistent asthma - controlled without a biologic at this point in time    Seasonal and perennial allergic rhinitis (indoor and outdoor molds, cat, dog, cockroach)   Chronic back pain - with a history of scoliosis (rods placement in 2010) and now on disability    History of ground glass opacities in 2016 - repeat chest CT markedly improved   Recent COVID-19 infection in August 2022   S/p bilateral knee replacement    Leg pain - felt to be secondary to Fasenra  (planning to transition to Tezspire )  Recent passing of her fiance    Plan/Recommendations:   1. Moderate persistent asthma, uncomplicated - Lung testing looked STABLE today. - Nucala  sample given again. - Call [phone] to her your Nucala  set up again.  - Daily controller medication(s): Symbicort  160/4.58mcg two puffs TWICE DAILY and Singulair  daily and Nucala  monthly  - Prior to physical activity: albuterol  2 puffs 10-15 minutes before physical activity. - Rescue medications: albuterol  4 puffs every 4-6 hours as needed or albuterol  nebulizer one vial every 4-6 hours as needed - Changes during respiratory infections or worsening symptoms: CHANGE Symbicort  to Breztri  two puffs TWICE DAILY for one to two weeks. - Asthma control goals:  * Full participation in all desired activities (may need albuterol  before activity) * Albuterol  use two time or less a week on average (not counting use with activity) * Cough interfering with sleep two time or less a month * Oral steroids no more than once a year * No hospitalizations  2. Chronic rhinitis (weeds, indoor molds, outdoor molds, cat, dog, and cockroach) - Continue with fluticasone  nasal spray 1-2 sprays per nostril daily. - Continue with Astelin  2 sprays per nostril up to twice daily as needed.  - Continue with cetirizine  10mg  daily. - Continue with montelukast  10mg  daily.  - Think about  allergy  shots for long-term control.   3. Return in about 6 months (around 06/28/2024). You can have the follow up appointment with Dr. Iva or a Nurse Practicioner (our Nurse Practitioners are excellent and always have Physician oversight!).    Subjective:   [name] is a 63 y.o. female presenting today for follow up of  Chief Complaint  Patient presents with   Establish Care   Follow-up    [name] has a history of the following: Patient Active Problem List   Diagnosis Date Noted   Family history of early CAD 08/24/2022   Mixed hyperlipidemia 08/24/2022   History of total knee arthroplasty, right 08/24/2022   GERD (gastroesophageal reflux disease)    Esophageal dysphagia    Varicose veins of bilateral lower extremities with pain 01/02/2020   Seasonal and perennial allergic rhinitis 04/15/2018   Fusion of spine, thoracolumbar region 11/29/2016   Chronic insomnia 11/29/2016   Eosinophilia 03/29/2016   Chronic migraine w/o aura w/o status migrainosus, not intractable 03/14/2016   Arthritis of knee, degenerative 07/09/2015   Chronic low back pain 04/15/2015   Cough variant asthma 08/09/2014   Pulmonary infiltrates with eosinophilia (HCC) 08/09/2014    History obtained from: chart review and patient.  Discussed the use of AI scribe software for clinical note transcription with the patient and/or guardian, who gave verbal consent to proceed.  [name] is a 63 y.o. female presenting for a follow up visit.  She was last seen in March 2025.  At that time, lung testing looked excellent.  We continue  with Symbicort .  We gave her information on Nucala .  We also continue with Singulair  10 mg twice daily.  For her rhinitis, we will continue with Flonase , Astelin , cetirizine , and montelukast .  She had previously experienced arthralgias with both Dupixent and Tezspire  and Fasenra .   Since last visit, she has not done well. Her fiance recently passed away from a protracted fight with  colorectal cancer and liver failure. He passed away around 3 months ago. She has bene dealing with it for a short period of time.   Asthma/Respiratory Symptom History: She has experienced increased asthma symptoms since her last visit, feeling 'edgy' and noting that a short course of prednisone  prescribed last week was insufficient. She completed the prednisone  course a few days ago, around Sunday, but continues to experience wheezing. She has been using her nebulizer more frequently since Monday after returning from a trip on Sunday. She is completing a round of prednisone . She did feel that she did well with Nucala , which she received a few months ago. She would like to get this back on board.   She describes waking up at night feeling panicky and unable to catch her breath, leading her to pace the floors for about two hours. She frequently coughs up clear mucus, which recurs shortly after clearing her throat.  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 She experiences significant rhinorrhea and describes persistent symptoms despite her current treatments. She has a cat that stays mostly outside and is not allowed in her bedroom, and she also has dogs.  She has not been on allergy  shots before and dislikes taking prednisone , although she acknowledges its effectiveness. She is currently using a nebulizer and has been using it more frequently since Monday.   Otherwise, there have been no changes to her past medical history, surgical history, family history, or social history.    Review of systems otherwise negative other than that mentioned in the HPI.    Objective:   Blood pressure 130/72, pulse 68, temperature (!) 97.2 F (36.2 C), temperature source Temporal, resp. rate 18, height 5' 5.35 (1.66 m), weight 166 lb (75.3 kg), last menstrual period 08/30/2002,  SpO2 96%. Body mass index is 27.33 kg/m.    Physical Exam Vitals reviewed.  Constitutional:      General: She is not in acute distress.    Appearance: She is well-developed. She is not ill-appearing or toxic-appearing.     Comments: Seems more somber today.   HENT:     Head: Normocephalic and atraumatic.     Right Ear: Tympanic membrane, ear canal and external ear normal.     Left Ear: Tympanic membrane, ear canal and external ear normal.     Nose: No nasal deformity, septal deviation, mucosal edema or rhinorrhea.     Right Turbinates: Enlarged, swollen and pale.     Left Turbinates: Enlarged, swollen and pale.     Right Sinus: No maxillary sinus tenderness or frontal sinus tenderness.     Left Sinus: No maxillary sinus tenderness or frontal sinus tenderness.     Comments: No nasal polyps.    Mouth/Throat:     Mouth: Mucous membranes are not pale and not dry.     Pharynx: Uvula midline.  Eyes:     General: Lids are normal. Allergic shiner present.        Right eye: No discharge.  Left eye: No discharge.     Conjunctiva/sclera: Conjunctivae normal.     Right eye: Right conjunctiva is not injected. No chemosis.    Left eye: Left conjunctiva is not injected. No chemosis.    Pupils: Pupils are equal, round, and reactive to light.  Cardiovascular:     Rate and Rhythm: Normal rate and regular rhythm.     Heart sounds: Normal heart sounds.  Pulmonary:     Effort: Pulmonary effort is normal. No tachypnea, accessory muscle usage or respiratory distress.     Breath sounds: No wheezing, rhonchi or rales.     Comments: She has expiratory wheezing isolated in the bilateral lung fields. She is slightly tachypneic.   Chest:     Chest wall: No tenderness.  Musculoskeletal:     Comments: Scars over the bilateral knees.  Ambulating without a cane.  Lymphadenopathy:     Cervical: No cervical adenopathy.  Skin:    General: Skin is warm.     Capillary Refill: Capillary refill takes  less than 2 seconds.     Coloration: Skin is not pale.     Findings: No abrasion, erythema, petechiae or rash. Rash is not papular, urticarial or vesicular.     Comments: No eczematous or urticarial lesions noted.  Neurological:     Mental Status: She is alert.  Psychiatric:        Behavior: Behavior is cooperative.      Diagnostic studies:    Spirometry: results abnormal (FEV1: 1.75/69%, FVC: 2.31/71%, FEV1/FVC: 76%).    Spirometry consistent with possible restrictive disease.  This is fairly stable.   Allergy  Studies: none        Marty Shaggy, MD  [facility] 

## 2023-12-28 NOTE — Progress Notes (Signed)
 Patient has appt 01/03/2024

## 2024-01-01 ENCOUNTER — Ambulatory Visit

## 2024-01-01 VITALS — BP 130/72 | HR 68 | Ht 65.0 in | Wt 166.0 lb

## 2024-01-01 DIAGNOSIS — Z0000 Encounter for general adult medical examination without abnormal findings: Secondary | ICD-10-CM

## 2024-01-01 NOTE — Patient Instructions (Signed)
 [name] , Thank you for taking time out of your busy schedule to complete your Annual Wellness Visit with me. I enjoyed our conversation and look forward to speaking with you again next year. I, as well as your care team,  appreciate your ongoing commitment to your health goals. Please review the following plan we discussed and let me know if I can assist you in the future. Your Game plan/ To Do List    Follow up Visits: Next Medicare AWV with our clinical staff: 01/01/25 at 10:40a.m.   Next Office Visit with your provider: 02/06/24 at 11:15a.m.  Clinician Recommendations:  Aim for 30 minutes of exercise or brisk walking, 6-8 glasses of water , and 5 servings of fruits and vegetables each day.       This is a list of the screening recommended for you and due dates:  Health Maintenance  Topic Date Due   HIV Screening  Never done   Medicare Annual Wellness Visit  01/19/2024*   Zoster (Shingles) Vaccine (1 of 2) 03/21/2024*   COVID-19 Vaccine (1) 01/16/2025*   Flu Shot  01/26/2024   Mammogram  12/19/2024   Cologuard (Stool DNA test)  11/30/2026   Colon Cancer Screening  05/20/2031   DTaP/Tdap/Td vaccine (4 - Td or Tdap) 12/19/2033   Pneumococcal Vaccination  Completed   Hepatitis C Screening  Completed   Hepatitis B Vaccine  Aged Out   HPV Vaccine  Aged Out   Meningitis B Vaccine  Aged Out  *Topic was postponed. The date shown is not the original due date.    Advanced directives: (Declined) Advance directive discussed with you today. Even though you declined this today, please call our office should you change your mind, and we can give you the proper paperwork for you to fill out. Advance Care Planning is important because it:  [x]  Makes sure you receive the medical care that is consistent with your values, goals, and preferences  [x]  It provides guidance to your family and loved ones and reduces their decisional burden about whether or not they are making the right decisions based on  your wishes.  Follow the link provided in your after visit summary or read over the paperwork we have mailed to you to help you started getting your Advance Directives in place. If you need assistance in completing these, please reach out to us  so that we can help you!  See attachments for Preventive Care and Fall Prevention Tips.

## 2024-01-01 NOTE — Progress Notes (Signed)
 Subjective:   [name] is a 63 y.o. who presents for a Medicare Wellness preventive visit.  As a reminder, Annual Wellness Visits don't include a physical exam, and some assessments may be limited, especially if this visit is performed virtually. We may recommend an in-person follow-up visit with your provider if needed.  Visit Complete: Virtual I connected with  [name] on 01/01/24 by a audio enabled telemedicine application and verified that I am speaking with the correct person using two identifiers.  Patient Location: Home  Provider Location: Home Office  I discussed the limitations of evaluation and management by telemedicine. The patient expressed understanding and agreed to proceed.  Vital Signs: Because this visit was a virtual/telehealth visit, some criteria may be missing or patient reported. Any vitals not documented were not able to be obtained and vitals that have been documented are patient reported.  VideoDeclined- This patient declined Librarian, academic. Therefore the visit was completed with audio only.  Persons Participating in Visit: Patient.  AWV Questionnaire: No: Patient Medicare AWV questionnaire was not completed prior to this visit.  Cardiac Risk Factors include: advanced age (>102men, >64 women);dyslipidemia     Objective:    Today's Vitals   01/01/24 1330  BP: 130/72  Pulse: 68  Weight: 166 lb (75.3 kg)  Height: 5' 5 (1.651 m)   Body mass index is 27.62 kg/m.     01/01/2024    1:35 PM 10/11/2022   12:01 PM 01/18/2022    1:12 PM 10/08/2021    6:32 AM 10/07/2021    2:13 PM 09/27/2021   11:36 AM 05/23/2021    5:06 AM  Advanced Directives  Does Patient Have a Medical Advance Directive? No No No No No No No  Would patient like information on creating a medical advance directive?  No - Patient declined  No - Patient declined No - Patient declined No - Patient declined No - Patient declined    Current Medications  (verified) Outpatient Encounter Medications as of 01/01/2024  Medication Sig   acetaminophen  (TYLENOL ) 500 MG tablet Take 1,000 mg by mouth every 8 (eight) hours as needed for moderate pain.   albuterol  (VENTOLIN  HFA) 108 (90 Base) MCG/ACT inhaler INHALE 2 PUFFS into THE lungs EVERY 4 HOURS AS NEEDED FOR wheezing OR SHORTNESS OF BREATH   azelastine  (ASTELIN ) 0.1 % nasal spray INSTILL 2 SPRAYS IN EACH NOSTRIL TWICE DAILY AS DIRECTED   budesonide -glycopyrrolate -formoterol  (BREZTRI  AEROSPHERE) 160-9-4.8 MCG/ACT AERO inhaler INHALE TWO PUFFS INTO THE LUNGS IN THE MORNING & AT BEDTIME   celecoxib  (CELEBREX ) 200 MG capsule Take by mouth 2 (two) times daily.   cetirizine  (ZYRTEC ) 10 MG tablet Take 1 tablet (10 mg total) by mouth daily.   EPINEPHrine  0.3 mg/0.3 mL IJ SOAJ injection Inject 0.3 mg into the muscle as needed for anaphylaxis.   fluticasone  (FLONASE ) 50 MCG/ACT nasal spray INSTILL 2 SPRAYS IN EACH NOSTRIL EVERY MORNING AND AT BEDTIME   hydrOXYzine  (ATARAX /VISTARIL ) 50 MG tablet TAKE 1/2 TO 1 TABLET BY MOUTH EVERY 8 HOURS AS NEEDED FOR ANXIETY   mirtazapine  (REMERON ) 7.5 MG tablet Take 1 tablet (7.5 mg total) by mouth at bedtime.   montelukast  (SINGULAIR ) 10 MG tablet TAKE 1 TABLET BY MOUTH AT BEDTIME   morphine  (MSIR) 15 MG tablet Take 15 mg by mouth every 8 (eight) hours as needed. Back Pain   Multiple Vitamins-Minerals (MULTIVITAMIN WITH MINERALS) tablet Take 1 tablet by mouth daily.   pantoprazole  (PROTONIX ) 40 MG  tablet TAKE 1 TABLET BY MOUTH DAILY   Propylene Glycol (SYSTANE BALANCE) 0.6 % SOLN Place 1 drop into both eyes as needed (dry eyes).   tiZANidine  (ZANAFLEX ) 4 MG tablet Take 1 tablet (4 mg total) by mouth every 8 (eight) hours as needed for muscle spasms.   Mepolizumab  (NUCALA ) 100 MG/ML SOAJ Inject 1 mL (100 mg total) into the skin every 28 (twenty-eight) days. (Patient not taking: Reported on 01/01/2024)   SYMBICORT  160-4.5 MCG/ACT inhaler INHALE TWO PUFFS BY MOUTH TWICE DAILY  (Patient not taking: Reported on 01/01/2024)   Facility-Administered Encounter Medications as of 01/01/2024  Medication   Benralizumab  SOSY 30 mg   mepolizumab  (NUCALA ) injection 100 mg    Allergies (verified) Patient has no known allergies.   History: Past Medical History:  Diagnosis Date   Arthritis    Spine, R knee   Asthma    Back pain    Rod in back   GERD (gastroesophageal reflux disease)    History of blood transfusion    Back surgery   hx Cough 09/03/2014   Followed in Pulmonary clinic/ [facility]/ [facility] - sinus CT 09/08/2014> Mild mucosal thickening involves the paranasal sinuses  - Eos 1.2  08/06/14 - Allergy  profile 11/27/14 >  Eos 1.0/ IgE  41 with neg RAST     Mixed hyperlipidemia 08/24/2022   Past Surgical History:  Procedure Laterality Date   ABDOMINAL HYSTERECTOMY  2004   cysts, pelvic pain   BACK SURGERY  2010   rod   BACK SURGERY  2014   broken hardware removal   BIOPSY  05/19/2021   Procedure: BIOPSY;  Surgeon: Golda Claudis PENNER, MD;  [facility];  Service: Endoscopy;;   COLONOSCOPY WITH PROPOFOL  N/A 05/19/2021   Procedure: COLONOSCOPY WITH PROPOFOL ;  Surgeon: Golda Claudis PENNER, MD;  [facility];  Service: Endoscopy;  Laterality: N/A;  10:10   ESOPHAGEAL DILATION N/A 05/19/2021   Procedure: ESOPHAGEAL DILATION;  Surgeon: Golda Claudis PENNER, MD;  [facility]: [facility];  Service: Endoscopy;  Laterality: N/A;   ESOPHAGOGASTRODUODENOSCOPY (EGD) WITH PROPOFOL  N/A 05/19/2021   Procedure: ESOPHAGOGASTRODUODENOSCOPY (EGD) WITH PROPOFOL ;  Surgeon: Golda Claudis PENNER, MD;  [facility];  Service: Endoscopy;  Laterality: N/A;   KNEE ARTHROSCOPY Right 2017   KNEE ARTHROSCOPY Left 2022   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   Procedure: TOTAL KNEE ARTHROPLASTY;  Surgeon: Shari Sieving, MD;  Location: [facility];  Service: Orthopedics;  Laterality: Left;   TOTAL KNEE ARTHROPLASTY Right 01/28/2022   Procedure: TOTAL KNEE ARTHROPLASTY;   Surgeon: Shari Sieving, MD;  Location: [facility];  Service: Orthopedics;  Laterality: Right;   Family History  Problem Relation Age of Onset   Emphysema Maternal Grandmother        never smoker   COPD Maternal Grandmother    Emphysema Maternal Uncle        smoked   COPD Maternal Uncle    Arthritis Mother    Diabetes Mother    Hyperlipidemia Mother    Hypertension Mother    Early death Father 47       MVA   Asthma Sister    Asthma Brother    Heart disease Maternal Grandfather    Social History   Socioeconomic History   Marital status: Widowed    Spouse name: Daril   Number of children: 2   Years of education: 12   Highest education level: Not on file  Occupational History   Occupation: disabled  Comment: back  Tobacco Use   Smoking status: Never   Smokeless tobacco: Never  Vaping Use   Vaping status: Never Used  Substance and Sexual Activity   Alcohol use: No   Drug use: No   Sexual activity: Yes    Birth control/protection: Surgical  Other Topics Concern   Not on file  Social History Narrative   Spouse Daril of 34 years passed away   Two sons/ grandchildren   Disabled from back pain/had scoliosis. Has had back reconstruction.    Dr. Velton doctor.    Social Drivers of Corporate investment banker Strain: Low Risk  (01/01/2024)   Overall Financial Resource Strain (CARDIA)    Difficulty of Paying Living Expenses: Not very hard  Food Insecurity: No Food Insecurity (01/01/2024)   Hunger Vital Sign    Worried About Running Out of Food in the Last Year: Never true    Ran Out of Food in the Last Year: Never true  Transportation Needs: No Transportation Needs (01/01/2024)   PRAPARE - Administrator, Civil Service (Medical): No    Lack of Transportation (Non-Medical): No  Physical Activity: Sufficiently Active (10/11/2022)   Exercise Vital Sign    Days of Exercise per Week: 3 days    Minutes of Exercise per Session: 60 min  Stress: Stress Concern  Present (01/01/2024)   [employer] of Occupational Health - Occupational Stress Questionnaire    Feeling of Stress: Rather much  Social Connections: Moderately Isolated (01/01/2024)   Social Connection and Isolation Panel    Frequency of Communication with Friends and Family: Three times a week    Frequency of Social Gatherings with Friends and Family: Three times a week    Attends Religious Services: More than 4 times per year    Active Member of Clubs or Organizations: No    Attends Banker Meetings: Never    Marital Status: Divorced    Tobacco Counseling Counseling given: Yes    Clinical Intake:  Pre-visit preparation completed: Yes  Pain : No/denies pain     BMI - recorded: 27.62 Nutritional Status: BMI 25 -29 Overweight Nutritional Risks: None Diabetes: No  Lab Results  Component Value Date   HGBA1C 5.7 08/28/2019   HGBA1C 5.4 12/14/2017     How often do you need to have someone help you when you read instructions, pamphlets, or other written materials from your doctor or pharmacy?: 1 - Never  Interpreter Needed?: No  Information entered by :: alia t/cma   Activities of Daily Living     01/01/2024    1:32 PM  In your present state of health, do you have any difficulty performing the following activities:  Hearing? 0  Vision? 0  Comment pt goes to [employer] in [place]/last ov 14yrs  Difficulty concentrating or making decisions? 1  Comment a little  Walking or climbing stairs? 0  Dressing or bathing? 0  Doing errands, shopping? 0  Preparing Food and eating ? N  Using the Toilet? N  In the past six months, have you accidently leaked urine? N  Do you have problems with loss of bowel control? N  Managing your Medications? N  Managing your Finances? N  Housekeeping or managing your Housekeeping? N    Patient Care Team: Jolinda Norene HERO, DO as PCP - General (Family Medicine) Debera Jayson MATSU, MD as PCP - Cardiology  (Cardiology) Iva Marty Saltness, MD as Consulting Physician (Allergy  and Immunology) Golda Claudis PENNER, MD (  Inactive) as Consulting Physician (Gastroenterology) Shari Sieving, MD as Consulting Physician (Orthopedic Surgery) Gust Royden ORN, MD (Orthopedic Surgery)  I have updated your Care Teams any recent Medical Services you may have received from other providers in the past year.     Assessment:   This is a routine wellness examination for [facility].  Hearing/Vision screen No results found.   Goals Addressed   None    Depression Screen     01/01/2024    1:36 PM 03/29/2023    9:57 AM 10/11/2022   12:00 PM 08/24/2022    9:03 AM 10/07/2021    2:08 PM 08/23/2021    8:07 AM 04/09/2021    4:34 PM  PHQ 2/9 Scores  PHQ - 2 Score 1 0 0 0 0 0 0  PHQ- 9 Score 7 0 0 2 4 5 3     Fall Risk     01/01/2024    1:30 PM 03/29/2023    9:59 AM 10/11/2022   11:59 AM 08/24/2022    9:03 AM 10/07/2021    2:03 PM  Fall Risk   Falls in the past year? 0 0 0 0 0  Number falls in past yr: 0 0 0 0 0  Injury with Fall? 0 0 0 0 0  Risk for fall due to : No Fall Risks No Fall Risks No Fall Risks No Fall Risks Orthopedic patient;Medication side effect  Follow up Falls evaluation completed Education provided Falls prevention discussed Education provided Education provided;Falls prevention discussed      Data saved with a previous flowsheet row definition    MEDICARE RISK AT HOME:  Medicare Risk at Home Any stairs in or around the home?: Yes If so, are there any without handrails?: Yes Home free of loose throw rugs in walkways, pet beds, electrical cords, etc?: Yes Adequate lighting in your home to reduce risk of falls?: Yes Life alert?: No Use of a cane, walker or w/c?: No Grab bars in the bathroom?: Yes Shower chair or bench in shower?: No Elevated toilet seat or a handicapped toilet?: Yes  TIMED UP AND GO:  Was the test performed?  no  Cognitive Function: 6CIT completed        01/01/2024     1:39 PM 10/11/2022   12:01 PM 10/07/2021    2:12 PM 10/04/2019   11:18 AM  6CIT Screen  What Year? 0 points 0 points 0 points   What month? 0 points 0 points 0 points   What time? 0 points 0 points 0 points 0 points  Count back from 20 0 points 0 points 0 points 0 points  Months in reverse 0 points 0 points 4 points 0 points  Repeat phrase 0 points 0 points 0 points 0 points  Total Score 0 points 0 points 4 points     Immunizations Immunization History  Administered Date(s) Administered   Influenza, Seasonal, Injecte, Preservative Fre 05/28/2013, 03/29/2023   Influenza,inj,Quad PF,6+ Mos 06/07/2017, 04/16/2018   PNEUMOCOCCAL CONJUGATE-20 12/20/2023   Pneumococcal Conjugate-13 11/29/2017   Td 06/28/2011   Tdap 06/29/2011, 12/20/2023    Screening Tests Health Maintenance  Topic Date Due   HIV Screening  Never done   Medicare Annual Wellness (AWV)  01/19/2024 (Originally 10/11/2023)   Zoster Vaccines- Shingrix (1 of 2) 03/21/2024 (Originally [birth year]/25/1982)   COVID-19 Vaccine (1) 01/16/2025 (Originally 01/26/1966)   INFLUENZA VACCINE  01/26/2024   MAMMOGRAM  12/19/2024   Fecal DNA (Cologuard)  11/30/2026   Colonoscopy  05/20/2031  DTaP/Tdap/Td (4 - Td or Tdap) 12/19/2033   Pneumococcal Vaccine 66-17 Years old  Completed   Hepatitis C Screening  Completed   Hepatitis B Vaccines  Aged Out   HPV VACCINES  Aged Out   Meningococcal B Vaccine  Aged Out    Health Maintenance  Health Maintenance Due  Topic Date Due   HIV Screening  Never done   Health Maintenance Items Addressed: See Nurse Notes at the end of this note  Additional Screening:  Vision Screening: Recommended annual ophthalmology exams for early detection of glaucoma and other disorders of the eye. Would you like a referral to an eye doctor? No    Dental Screening: Recommended annual dental exams for proper oral hygiene  Community Resource Referral / Chronic Care Management: CRR required this visit?  No   CCM  required this visit?  No   Plan:    I have personally reviewed and noted the following in the patient's chart:   Medical and social history Use of alcohol, tobacco or illicit drugs  Current medications and supplements including opioid prescriptions. Patient is not currently taking opioid prescriptions. Functional ability and status Nutritional status Physical activity Advanced directives List of other physicians Hospitalizations, surgeries, and ER visits in previous 12 months Vitals Screenings to include cognitive, depression, and falls Referrals and appointments  In addition, I have reviewed and discussed with patient certain preventive protocols, quality metrics, and best practice recommendations. A written personalized care plan for preventive services as well as general preventive health recommendations were provided to patient.   Ozie Ned, CMA   01/01/2024   After Visit Summary: (MyChart) Due to this being a telephonic visit, the after visit summary with patients personalized plan was offered to patient via MyChart   Notes: Nothing significant to report at this time.

## 2024-01-02 ENCOUNTER — Other Ambulatory Visit (HOSPITAL_COMMUNITY): Payer: Self-pay

## 2024-01-03 ENCOUNTER — Ambulatory Visit: Payer: Self-pay | Admitting: Family Medicine

## 2024-01-03 ENCOUNTER — Other Ambulatory Visit: Payer: Self-pay | Admitting: Family Medicine

## 2024-01-03 ENCOUNTER — Ambulatory Visit
Admission: RE | Admit: 2024-01-03 | Discharge: 2024-01-03 | Disposition: A | Discharge status: Home / Self Care | Source: Ambulatory Visit | Attending: Family Medicine | Admitting: Family Medicine

## 2024-01-03 DIAGNOSIS — R921 Mammographic calcification found on diagnostic imaging of breast: Secondary | ICD-10-CM

## 2024-01-03 DIAGNOSIS — R928 Other abnormal and inconclusive findings on diagnostic imaging of breast: Secondary | ICD-10-CM

## 2024-01-05 ENCOUNTER — Other Ambulatory Visit: Payer: Self-pay

## 2024-01-10 ENCOUNTER — Ambulatory Visit
Admission: RE | Admit: 2024-01-10 | Discharge: 2024-01-10 | Disposition: A | Discharge status: Home / Self Care | Source: Ambulatory Visit | Attending: Family Medicine | Admitting: Family Medicine

## 2024-01-10 DIAGNOSIS — R921 Mammographic calcification found on diagnostic imaging of breast: Secondary | ICD-10-CM

## 2024-01-10 DIAGNOSIS — N6489 Other specified disorders of breast: Secondary | ICD-10-CM | POA: Diagnosis not present

## 2024-01-10 HISTORY — PX: BREAST BIOPSY: SHX20

## 2024-01-11 LAB — SURGICAL PATHOLOGY

## 2024-01-12 ENCOUNTER — Other Ambulatory Visit: Payer: Self-pay

## 2024-01-16 ENCOUNTER — Other Ambulatory Visit: Payer: Self-pay

## 2024-01-16 DIAGNOSIS — M545 Low back pain, unspecified: Secondary | ICD-10-CM | POA: Diagnosis not present

## 2024-01-16 DIAGNOSIS — Z981 Arthrodesis status: Secondary | ICD-10-CM | POA: Diagnosis not present

## 2024-01-16 DIAGNOSIS — G894 Chronic pain syndrome: Secondary | ICD-10-CM | POA: Diagnosis not present

## 2024-01-16 NOTE — Progress Notes (Signed)
 Patient never initiated Nucala  therapy.  Disenrolling from Specialty Pharmacy services at this time.

## 2024-01-25 ENCOUNTER — Encounter: Payer: Self-pay | Admitting: Allergy & Immunology

## 2024-01-25 ENCOUNTER — Encounter: Payer: Self-pay | Admitting: Family Medicine

## 2024-01-25 DIAGNOSIS — G479 Sleep disorder, unspecified: Secondary | ICD-10-CM

## 2024-01-25 DIAGNOSIS — F4321 Adjustment disorder with depressed mood: Secondary | ICD-10-CM

## 2024-01-26 MED ORDER — TRAZODONE HCL 50 MG PO TABS: 25.0000 mg | ORAL_TABLET | Freq: Every evening | ORAL | 3 refills | Status: DC | PRN

## 2024-02-06 ENCOUNTER — Encounter: Payer: Self-pay | Admitting: Family Medicine

## 2024-02-06 ENCOUNTER — Telehealth (INDEPENDENT_AMBULATORY_CARE_PROVIDER_SITE_OTHER): Admitting: Family Medicine

## 2024-02-06 DIAGNOSIS — F4321 Adjustment disorder with depressed mood: Secondary | ICD-10-CM | POA: Diagnosis not present

## 2024-02-06 DIAGNOSIS — G479 Sleep disorder, unspecified: Secondary | ICD-10-CM

## 2024-02-06 MED ORDER — ZOLPIDEM TARTRATE ER 6.25 MG PO TBCR: 6.2500 mg | EXTENDED_RELEASE_TABLET | Freq: Every evening | ORAL | 2 refills | Status: AC | PRN

## 2024-02-06 NOTE — Progress Notes (Signed)
 MyChart Video visit  Subjective: CC: Follow-up sleep difficulties, grief reaction PCP: Michelle Norene HERO, DO YEP:[name] is a 63 y.o. female. Patient provides verbal consent for consult held via video.  Due to COVID-19 pandemic this visit was conducted virtually. This visit type was conducted due to national recommendations for restrictions regarding the COVID-19 Pandemic (e.g. social distancing, sheltering in place) in an effort to limit this patient's exposure and mitigate transmission in our community. All issues noted in this document were discussed and addressed.  A physical exam was not performed with this format.   Location of patient: home Location of provider: [facility] Others present for call: none  1.  Sleep difficulties associated with grief reaction She continues to mourn the loss of her partner.  She notes that she does still have some racing thoughts and anxiety but she does not want to be treated for this.  She tried the trazodone  and half a tablet gives her a few hours of sleep but when she took the full tablet it caused her to be excessively sedated the following day.  She continues to have nighttime awakenings.   ROS: Per HPI  No Known Allergies Past Medical History:  Diagnosis Date   Arthritis    Spine, R knee   Asthma    Back pain    Rod in back   GERD (gastroesophageal reflux disease)    History of blood transfusion    Back surgery   hx Cough 09/03/2014   Followed in Pulmonary clinic/ [facility]/ [facility] - sinus CT 09/08/2014> Mild mucosal thickening involves the paranasal sinuses  - Eos 1.2  08/06/14 - Allergy  profile 11/27/14 >  Eos 1.0/ IgE  41 with neg RAST     Mixed hyperlipidemia 08/24/2022    Current Outpatient Medications:    acetaminophen  (TYLENOL ) 500 MG tablet, Take 1,000 mg by mouth every 8 (eight) hours as needed for moderate pain., Disp: , Rfl:    albuterol  (VENTOLIN  HFA) 108 (90 Base) MCG/ACT inhaler, INHALE 2 PUFFS into THE lungs EVERY 4  HOURS AS NEEDED FOR wheezing OR SHORTNESS OF BREATH, Disp: 8.5 g, Rfl: 2   azelastine  (ASTELIN ) 0.1 % nasal spray, INSTILL 2 SPRAYS IN EACH NOSTRIL TWICE DAILY AS DIRECTED, Disp: 30 mL, Rfl: 5   budesonide -glycopyrrolate -formoterol  (BREZTRI  AEROSPHERE) 160-9-4.8 MCG/ACT AERO inhaler, INHALE TWO PUFFS INTO THE LUNGS IN THE MORNING & AT BEDTIME, Disp: 10.7 g, Rfl: 0   celecoxib  (CELEBREX ) 200 MG capsule, Take by mouth 2 (two) times daily., Disp: , Rfl:    cetirizine  (ZYRTEC ) 10 MG tablet, Take 1 tablet (10 mg total) by mouth daily., Disp: 30 tablet, Rfl: 5   EPINEPHrine  0.3 mg/0.3 mL IJ SOAJ injection, Inject 0.3 mg into the muscle as needed for anaphylaxis., Disp: , Rfl:    fluticasone  (FLONASE ) 50 MCG/ACT nasal spray, INSTILL 2 SPRAYS IN EACH NOSTRIL EVERY MORNING AND AT BEDTIME, Disp: 16 g, Rfl: 5   hydrOXYzine  (ATARAX /VISTARIL ) 50 MG tablet, TAKE 1/2 TO 1 TABLET BY MOUTH EVERY 8 HOURS AS NEEDED FOR ANXIETY, Disp: 30 tablet, Rfl: prn   Mepolizumab  (NUCALA ) 100 MG/ML SOAJ, Inject 1 mL (100 mg total) into the skin every 28 (twenty-eight) days. (Patient not taking: Reported on 01/01/2024), Disp: 1 mL, Rfl: 11   montelukast  (SINGULAIR ) 10 MG tablet, TAKE 1 TABLET BY MOUTH AT BEDTIME, Disp: 30 tablet, Rfl: 3   morphine  (MSIR) 15 MG tablet, Take 15 mg by mouth every 8 (eight) hours as needed. Back Pain, Disp: ,  Rfl:    Multiple Vitamins-Minerals (MULTIVITAMIN WITH MINERALS) tablet, Take 1 tablet by mouth daily., Disp: , Rfl:    pantoprazole  (PROTONIX ) 40 MG tablet, TAKE 1 TABLET BY MOUTH DAILY, Disp: 30 tablet, Rfl: 5   Propylene Glycol (SYSTANE BALANCE) 0.6 % SOLN, Place 1 drop into both eyes as needed (dry eyes)., Disp: , Rfl:    SYMBICORT  160-4.5 MCG/ACT inhaler, INHALE TWO PUFFS BY MOUTH TWICE DAILY (Patient not taking: Reported on 01/01/2024), Disp: 10.2 g, Rfl: 5   tiZANidine  (ZANAFLEX ) 4 MG tablet, Take 1 tablet (4 mg total) by mouth every 8 (eight) hours as needed for muscle spasms., Disp: 30 tablet,  Rfl: 0   traZODone  (DESYREL ) 50 MG tablet, Take 0.5-1 tablets (25-50 mg total) by mouth at bedtime as needed for sleep., Disp: 30 tablet, Rfl: 3  Current Facility-Administered Medications:    Benralizumab  SOSY 30 mg, 30 mg, Subcutaneous, Q28 days, Iva Marty Saltness, MD, 30 mg at 09/07/22 1106   mepolizumab  (NUCALA ) injection 100 mg, 100 mg, Subcutaneous, Q28 days, Iva Marty Saltness, MD, 100 mg at 12/27/23 1139 Gen: nontoxic appearing female, appears tired Psych: mood stable but somewhat depressed. Does not appear to be responding to internal stimuli. Speech normal. Affect appropriate. Thought process linear   Assessment/ Plan: 63 y.o. female   Sleep difficulties - Plan: zolpidem  (AMBIEN  CR) 6.25 MG CR tablet Start Ambien  CR 6.25 mg nightly as needed.  Discussed using this only as needed and as sparingly as possible.  The national narcotic database was reviewed with no red flags.  She is under pain contract with her pain specialist and I did advise her to contact them to make sure it is okay for her to start this before she picks it up as I do not want to violate any controlled substance contract with them.  She will set up 2 to 59-month in person visit with me if she wishes to continue the medication at which point we will complete a controlled substance contract and urine drug screen  Start time: 11:30a End time: 11:38am  Total time spent on patient care (including video visit/ documentation): 10 minutes  [name], DO [facility] [phone]

## 2024-02-08 DIAGNOSIS — G894 Chronic pain syndrome: Secondary | ICD-10-CM | POA: Diagnosis not present

## 2024-02-08 DIAGNOSIS — Z981 Arthrodesis status: Secondary | ICD-10-CM | POA: Diagnosis not present

## 2024-02-08 DIAGNOSIS — M791 Myalgia, unspecified site: Secondary | ICD-10-CM | POA: Diagnosis not present

## 2024-02-08 DIAGNOSIS — M545 Low back pain, unspecified: Secondary | ICD-10-CM | POA: Diagnosis not present

## 2024-02-15 ENCOUNTER — Other Ambulatory Visit: Payer: Self-pay | Admitting: Allergy & Immunology

## 2024-03-06 ENCOUNTER — Other Ambulatory Visit: Payer: Self-pay | Admitting: Allergy & Immunology

## 2024-03-11 ENCOUNTER — Encounter: Payer: Self-pay | Admitting: Family Medicine

## 2024-03-11 DIAGNOSIS — J45991 Cough variant asthma: Secondary | ICD-10-CM

## 2024-03-11 DIAGNOSIS — J45901 Unspecified asthma with (acute) exacerbation: Secondary | ICD-10-CM

## 2024-03-14 DIAGNOSIS — M545 Low back pain, unspecified: Secondary | ICD-10-CM | POA: Diagnosis not present

## 2024-03-14 DIAGNOSIS — Z981 Arthrodesis status: Secondary | ICD-10-CM | POA: Diagnosis not present

## 2024-03-14 DIAGNOSIS — G894 Chronic pain syndrome: Secondary | ICD-10-CM | POA: Diagnosis not present

## 2024-03-20 ENCOUNTER — Ambulatory Visit: Admitting: Family Medicine

## 2024-03-20 ENCOUNTER — Encounter: Payer: Self-pay | Admitting: Family Medicine

## 2024-03-20 ENCOUNTER — Other Ambulatory Visit

## 2024-03-20 VITALS — BP 133/80 | HR 66 | Temp 98.0°F | Ht 67.0 in | Wt 152.0 lb

## 2024-03-20 DIAGNOSIS — R0602 Shortness of breath: Secondary | ICD-10-CM | POA: Diagnosis not present

## 2024-03-20 DIAGNOSIS — Z23 Encounter for immunization: Secondary | ICD-10-CM | POA: Diagnosis not present

## 2024-03-20 DIAGNOSIS — J4550 Severe persistent asthma, uncomplicated: Secondary | ICD-10-CM

## 2024-03-20 MED ORDER — PANTOPRAZOLE SODIUM 40 MG PO TBEC: 40.0000 mg | DELAYED_RELEASE_TABLET | Freq: Every day | ORAL | 4 refills | Status: AC

## 2024-03-20 MED ORDER — PREDNISONE 10 MG (21) PO TBPK: ORAL_TABLET | ORAL | 0 refills | Status: DC

## 2024-03-20 MED ORDER — CETIRIZINE HCL 10 MG PO TABS: 10.0000 mg | ORAL_TABLET | Freq: Every day | ORAL | 4 refills | Status: DC

## 2024-03-20 NOTE — Progress Notes (Signed)
 Subjective: RR:dynmuwzdd of breath PCP: Jolinda Michelle HERO, DO YEP:[name] is a 63 y.o. female presenting to clinic today for:  Shortness of breath Patient is here for persistent shortness of breath, dyspnea on exertion that seems to be her baseline but just not well-controlled.  She notes that over the last several weeks she has woken up with increased mucus production and feeling that she immediately needs to go to get her nebulizer treatment.  She is on albuterol  nebs and is pre but she is in this every single day in addition to the Symbicort .  She was trialed on Breztri  but did not really find that to be helpful compared to the Symbicort .  She has seen her asthma and allergist specialist and she has been on immunologic's in the past and multiple oral antihistamines and nasal sprays but nothing really is modifying her symptoms.  She has an appointment with pulmonology in [place] on October 20 but she is going to see if she can get Dr. Meade because one of her friends sees them.  She denies any hemoptysis, no fevers.  She does tend to sweat.  She has not had a CAT scan of her lungs in about 3 years.  She has had secondhand exposure to cigarette smoke but has never been a smoker herself.  No known mold exposures.  No known chemical exposures.   ROS: Per HPI  No Known Allergies Past Medical History:  Diagnosis Date   Arthritis    Spine, R knee   Asthma    Back pain    Rod in back   GERD (gastroesophageal reflux disease)    History of blood transfusion    Back surgery   hx Cough 09/03/2014   Followed in Pulmonary clinic/ [facility]/ [place] - sinus CT 09/08/2014> Mild mucosal thickening involves the paranasal sinuses  - Eos 1.2  08/06/14 - Allergy  profile 11/27/14 >  Eos 1.0/ IgE  41 with neg RAST     Mixed hyperlipidemia 08/24/2022    Current Outpatient Medications:    acetaminophen  (TYLENOL ) 500 MG tablet, Take 1,000 mg by mouth every 8 (eight) hours as needed for moderate  pain., Disp: , Rfl:    albuterol  (VENTOLIN  HFA) 108 (90 Base) MCG/ACT inhaler, INHALE 2 PUFFS into THE lungs EVERY 4 HOURS AS NEEDED FOR wheezing OR SHORTNESS OF BREATH, Disp: 8.5 g, Rfl: 2   azelastine  (ASTELIN ) 0.1 % nasal spray, INSTILL 2 SPRAYS IN EACH NOSTRIL TWICE DAILY AS DIRECTED, Disp: 30 mL, Rfl: 5   budesonide -glycopyrrolate -formoterol  (BREZTRI  AEROSPHERE) 160-9-4.8 MCG/ACT AERO inhaler, INHALE TWO PUFFS INTO THE LUNGS IN THE MORNING & AT BEDTIME, Disp: 10.7 g, Rfl: 3   celecoxib  (CELEBREX ) 200 MG capsule, Take by mouth 2 (two) times daily., Disp: , Rfl:    cetirizine  (ZYRTEC ) 10 MG tablet, Take 1 tablet (10 mg total) by mouth daily., Disp: 30 tablet, Rfl: 5   EPINEPHrine  0.3 mg/0.3 mL IJ SOAJ injection, Inject 0.3 mg into the muscle as needed for anaphylaxis., Disp: , Rfl:    fluticasone  (FLONASE ) 50 MCG/ACT nasal spray, INSTILL 2 SPRAYS IN EACH NOSTRIL EVERY MORNING AND AT BEDTIME, Disp: 16 g, Rfl: 5   hydrOXYzine  (ATARAX /VISTARIL ) 50 MG tablet, TAKE 1/2 TO 1 TABLET BY MOUTH EVERY 8 HOURS AS NEEDED FOR ANXIETY, Disp: 30 tablet, Rfl: prn   Mepolizumab  (NUCALA ) 100 MG/ML SOAJ, Inject 1 mL (100 mg total) into the skin every 28 (twenty-eight) days. (Patient not taking: Reported on 01/01/2024), Disp: 1 mL, Rfl:  11   montelukast  (SINGULAIR ) 10 MG tablet, TAKE 1 TABLET BY MOUTH AT BEDTIME, Disp: 30 tablet, Rfl: 3   morphine  (MSIR) 15 MG tablet, Take 15 mg by mouth every 8 (eight) hours as needed. Back Pain, Disp: , Rfl:    Multiple Vitamins-Minerals (MULTIVITAMIN WITH MINERALS) tablet, Take 1 tablet by mouth daily., Disp: , Rfl:    pantoprazole  (PROTONIX ) 40 MG tablet, TAKE 1 TABLET BY MOUTH DAILY, Disp: 30 tablet, Rfl: 5   Propylene Glycol (SYSTANE BALANCE) 0.6 % SOLN, Place 1 drop into both eyes as needed (dry eyes)., Disp: , Rfl:    SYMBICORT  160-4.5 MCG/ACT inhaler, INHALE TWO PUFFS BY MOUTH TWICE DAILY (Patient not taking: Reported on 01/01/2024), Disp: 10.2 g, Rfl: 5   tiZANidine  (ZANAFLEX ) 4  MG tablet, Take 1 tablet (4 mg total) by mouth every 8 (eight) hours as needed for muscle spasms., Disp: 30 tablet, Rfl: 0   zolpidem  (AMBIEN  CR) 6.25 MG CR tablet, Take 1 tablet (6.25 mg total) by mouth at bedtime as needed for sleep., Disp: 15 tablet, Rfl: 2  Current Facility-Administered Medications:    Benralizumab  SOSY 30 mg, 30 mg, Subcutaneous, Q28 days, Iva Marty Saltness, MD, 30 mg at 09/07/22 1106   mepolizumab  (NUCALA ) injection 100 mg, 100 mg, Subcutaneous, Q28 days, Iva Marty Saltness, MD, 100 mg at 12/27/23 1139 Social History   Socioeconomic History   Marital status: Widowed    Spouse name: Daril   Number of children: 2   Years of education: 12   Highest education level: Not on file  Occupational History   Occupation: disabled    Comment: back  Tobacco Use   Smoking status: Never   Smokeless tobacco: Never  Vaping Use   Vaping status: Never Used  Substance and Sexual Activity   Alcohol use: No   Drug use: No   Sexual activity: Yes    Birth control/protection: Surgical  Other Topics Concern   Not on file  Social History Narrative   Spouse Daril of 34 years passed away   Two sons/ grandchildren   Disabled from back pain/had scoliosis. Has had back reconstruction.    Dr. Velton doctor.    Social Drivers of Corporate investment banker Strain: Low Risk  (01/01/2024)   Overall Financial Resource Strain (CARDIA)    Difficulty of Paying Living Expenses: Not very hard  Food Insecurity: No Food Insecurity (01/01/2024)   Hunger Vital Sign    Worried About Running Out of Food in the Last Year: Never true    Ran Out of Food in the Last Year: Never true  Transportation Needs: No Transportation Needs (01/01/2024)   PRAPARE - Administrator, Civil Service (Medical): No    Lack of Transportation (Non-Medical): No  Physical Activity: Sufficiently Active (10/11/2022)   Exercise Vital Sign    Days of Exercise per Week: 3 days    Minutes of Exercise per  Session: 60 min  Stress: Stress Concern Present (01/01/2024)   [employer] of Occupational Health - Occupational Stress Questionnaire    Feeling of Stress: Rather much  Social Connections: Moderately Isolated (01/01/2024)   Social Connection and Isolation Panel    Frequency of Communication with Friends and Family: Three times a week    Frequency of Social Gatherings with Friends and Family: Three times a week    Attends Religious Services: More than 4 times per year    Active Member of Clubs or Organizations: No    Attends Club  or Organization Meetings: Never    Marital Status: Divorced  Catering manager Violence: Not At Risk (01/01/2024)   Humiliation, Afraid, Rape, and Kick questionnaire    Fear of Current or Ex-Partner: No    Emotionally Abused: No    Physically Abused: No    Sexually Abused: No   Family History  Problem Relation Age of Onset   Emphysema Maternal Grandmother        never smoker   COPD Maternal Grandmother    Emphysema Maternal Uncle        smoked   COPD Maternal Uncle    Arthritis Mother    Diabetes Mother    Hyperlipidemia Mother    Hypertension Mother    Early death Father 75       MVA   Asthma Sister    Asthma Brother    Heart disease Maternal Grandfather     Objective: Office vital signs reviewed. BP 133/80   Pulse 66   Temp 98 F (36.7 C)   Ht 5' 7 (1.702 m)   Wt 152 lb (68.9 kg)   LMP 08/30/2002 (Approximate)   SpO2 91%   BMI 23.81 kg/m   Physical Examination:  General: Awake, alert, well nourished, No acute distress Cardio: regular rate and rhythm, S1S2 heard, no murmurs appreciated Pulm: Global expiratory wheezes with no rhonchi or rales; normal work of breathing on room air    Assessment/ Plan: 63 y.o. female   Shortness of breath - Plan: DG Chest 2 View, CT Chest Wo Contrast  Severe persistent asthma, unspecified whether complicated - Plan: predniSONE  (STERAPRED UNI-PAK 21 TAB) 10 MG (21) TBPK tablet  Encounter for  immunization - Plan: Flu vaccine trivalent PF, 6mos and older(Flulaval,Afluria,Fluarix,Fluzone)   Likely due to uncontrolled persistent severe asthma but given refractory nature to multiple modalities of treatment she will be seeing pulmonology soon.  I did give her a pocket prescription for prednisone  Dosepak should she need it as she had global expiratory and inspiratory wheezes.  I encouraged her to use her albuterol  regularly for now.  We discussed red flag signs symptoms warranting further evaluation.  No signs or symptoms of bacterial infection today but should she develop these to low threshold for chest x-ray, if CAT scan not obtained prior to that time.  Pneumococcal vaccination administered   Michelle CHRISTELLA Fielding, DO [facility] [phone]

## 2024-03-21 DIAGNOSIS — H524 Presbyopia: Secondary | ICD-10-CM | POA: Diagnosis not present

## 2024-04-04 ENCOUNTER — Encounter: Payer: Self-pay | Admitting: Family Medicine

## 2024-04-04 ENCOUNTER — Ambulatory Visit (HOSPITAL_COMMUNITY)
Admission: RE | Admit: 2024-04-04 | Discharge: 2024-04-04 | Disposition: A | Discharge status: Home / Self Care | Source: Ambulatory Visit | Attending: Family Medicine | Admitting: Family Medicine

## 2024-04-04 DIAGNOSIS — I700 Atherosclerosis of aorta: Secondary | ICD-10-CM | POA: Diagnosis not present

## 2024-04-04 DIAGNOSIS — R0602 Shortness of breath: Secondary | ICD-10-CM | POA: Diagnosis not present

## 2024-04-04 DIAGNOSIS — R918 Other nonspecific abnormal finding of lung field: Secondary | ICD-10-CM | POA: Diagnosis not present

## 2024-04-05 NOTE — Telephone Encounter (Signed)
 It hasn't been read yet

## 2024-04-08 ENCOUNTER — Ambulatory Visit: Payer: Self-pay | Admitting: Family Medicine

## 2024-04-08 DIAGNOSIS — B9689 Other specified bacterial agents as the cause of diseases classified elsewhere: Secondary | ICD-10-CM

## 2024-04-08 MED ORDER — AZITHROMYCIN 250 MG PO TABS: ORAL_TABLET | ORAL | 0 refills | Status: AC

## 2024-04-11 DIAGNOSIS — M791 Myalgia, unspecified site: Secondary | ICD-10-CM | POA: Diagnosis not present

## 2024-04-11 DIAGNOSIS — G5600 Carpal tunnel syndrome, unspecified upper limb: Secondary | ICD-10-CM | POA: Diagnosis not present

## 2024-04-11 DIAGNOSIS — G894 Chronic pain syndrome: Secondary | ICD-10-CM | POA: Diagnosis not present

## 2024-04-11 DIAGNOSIS — M545 Low back pain, unspecified: Secondary | ICD-10-CM | POA: Diagnosis not present

## 2024-04-15 ENCOUNTER — Ambulatory Visit: Admitting: Internal Medicine

## 2024-05-06 ENCOUNTER — Ambulatory Visit: Admitting: Internal Medicine

## 2024-05-06 ENCOUNTER — Encounter: Payer: Self-pay | Admitting: Internal Medicine

## 2024-05-06 VITALS — BP 134/74 | HR 62 | Temp 98.7°F | Ht 67.0 in | Wt 148.6 lb

## 2024-05-06 DIAGNOSIS — J45991 Cough variant asthma: Secondary | ICD-10-CM

## 2024-05-06 DIAGNOSIS — J309 Allergic rhinitis, unspecified: Secondary | ICD-10-CM

## 2024-05-06 DIAGNOSIS — K219 Gastro-esophageal reflux disease without esophagitis: Secondary | ICD-10-CM | POA: Diagnosis not present

## 2024-05-06 DIAGNOSIS — J4540 Moderate persistent asthma, uncomplicated: Secondary | ICD-10-CM

## 2024-05-06 LAB — POCT EXHALED NITRIC OXIDE: FeNO level (ppb): 50

## 2024-05-06 MED ORDER — FLUTICASONE-SALMETEROL 500-50 MCG/ACT IN AEPB: 1.0000 | INHALATION_SPRAY | Freq: Two times a day (BID) | RESPIRATORY_TRACT | 5 refills | Status: DC

## 2024-05-06 NOTE — Progress Notes (Signed)
 [name]    [id number]    02/26/1961  Primary Care Physician:Gottschalk, Norene HERO, DO  Referring Physician: Jolinda Norene HERO, DO [address] [place],  [place] [address] Reason for Consultation: shortness of breath and chest pain Date of Consultation: 05/06/2024  Chief complaint:   Chief Complaint  Patient presents with   Consult    SOB, cough and congestion,  has an Asthma Dr  Exacerbation of persistent asthma, unspecified asthma severity      HPI:  Discussed the use of AI scribe software for clinical note transcription with the patient, who gave verbal consent to proceed.  History of Present Illness [name] is a 63 year old female with asthma who presents with worsening shortness of breath and cough.  She has experienced worsening shortness of breath over the past five to seven months, particularly during physical activity, necessitating frequent pauses to take deep breaths while walking or working. She uses an albuterol  inhaler, which provides relief but causes an unpleasant sensation. She also uses Symbicort  twice daily and Breztri  as needed, though she is uncertain about their relative effectiveness. She has been prescribed Fasenra  and Nucala  in the past, which initially alleviated symptoms but later resulted in joint pain.  She describes a persistent, deep cough that causes chest pain and produces clear sputum, sometimes relieved by burping. The cough occurs randomly and worsens with exertion. She has a history of asthma diagnosed in 2010 following back surgery, with no childhood respiratory issues. Seasonal changes and stress are identified as asthma triggers. She takes Singulair  for allergies and pantoprazole  for reflux daily.  Her family history includes respiratory issues such as emphysema, though neither parent smoked. She has never smoked cigarettes but has used marijuana occasionally. 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 She reports increased stress following the loss of her fianc in April.  In terms of medication, she uses Symbicort  twice daily and Breztri  as needed. She also has a nebulizer at home and uses albuterol  inhalers, including Ventolin  and ProAir , as rescue medications. She has been on prednisone  in the past, most recently in October, and had a CT scan around the same time.   Current Regimen: symbicort  160 2 puffs twice a day.  Asthma Triggers: seasonal allergies, stress, exertion Exacerbations in the last year: once in the last year.  History of hospitalization or intubation: Allergy  Testing/rhinitis: singulair   GERD: pantoprazole  ACT:  Asthma Control Test ACT Total Score  05/06/2024  9:00 AM 18  02/22/2023 12:00 PM 25  09/03/2021 12:00 PM 25   FeNO: Serum Eos/IgE:  Social history:  Occupation: Exposures: Smoking history:  Social History   Occupational History   Occupation: disabled    Comment: back  Tobacco Use   Smoking status: Never   Smokeless tobacco: Never  Vaping Use   Vaping status: Never Used  Substance and Sexual Activity   Alcohol use: No   Drug use: No   Sexual activity: Yes    Birth control/protection: Surgical    Relevant family history:  Family History  Problem Relation Age of Onset   Emphysema Maternal Grandmother        never smoker   COPD Maternal Grandmother    Emphysema Maternal Uncle        smoked   COPD Maternal Uncle    Arthritis Mother    Diabetes Mother    Hyperlipidemia Mother  Hypertension Mother    Early death Father 49       MVA   Asthma Sister    Asthma Brother    Heart disease Maternal Grandfather     Past Medical History:  Diagnosis Date   Arthritis    Spine, R knee   Asthma    Back pain    Rod in back   GERD (gastroesophageal reflux disease)    History of blood transfusion    Back surgery   hx Cough 09/03/2014   Followed in Pulmonary clinic/ [facility]/ [place] -  sinus CT 09/08/2014> Mild mucosal thickening involves the paranasal sinuses  - Eos 1.2  08/06/14 - Allergy  profile 11/27/14 >  Eos 1.0/ IgE  41 with neg RAST     Mixed hyperlipidemia 08/24/2022    Past Surgical History:  Procedure Laterality Date   ABDOMINAL HYSTERECTOMY  2004   cysts, pelvic pain   BACK SURGERY  2010   rod   BACK SURGERY  2014   broken hardware removal   BIOPSY  05/19/2021   Procedure: BIOPSY;  Surgeon: Golda Claudis PENNER, MD;  [facility]: [facility];  Service: Endoscopy;;   BREAST BIOPSY Right 01/10/2024   MM RT BREAST BX W LOC DEV 1ST LESION IMAGE BX SPEC STEREO GUIDE 01/10/2024 [facility]   COLONOSCOPY WITH PROPOFOL  N/A 05/19/2021   Procedure: COLONOSCOPY WITH PROPOFOL ;  Surgeon: Golda Claudis PENNER, MD;  Location: [facility];  Service: Endoscopy;  Laterality: N/A;  10:10   ESOPHAGEAL DILATION N/A 05/19/2021   Procedure: ESOPHAGEAL DILATION;  Surgeon: Golda Claudis PENNER, MD;  Location: [facility];  Service: Endoscopy;  Laterality: N/A;   ESOPHAGOGASTRODUODENOSCOPY (EGD) WITH PROPOFOL  N/A 05/19/2021   Procedure: ESOPHAGOGASTRODUODENOSCOPY (EGD) WITH PROPOFOL ;  Surgeon: Golda Claudis PENNER, MD;  Location: [facility];  Service: Endoscopy;  Laterality: N/A;   KNEE ARTHROSCOPY Right 2017   KNEE ARTHROSCOPY Left 2022   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 Gen:      No acute distress ENT:  no nasal polyps, mucus membranes moist Lungs:    No increased respiratory  effort, symmetric chest wall excursion, clear to auscultation bilaterally, mild inspiratory squawks, mild expiratory wheezing  CV:         Regular rate and rhythm; no murmurs, rubs, or gallops.  No pedal edema Abd:      + bowel sounds; soft, non-tender; no distension MSK: no acute synovitis of DIP or PIP joints, no mechanics hands.  Skin:      Warm and dry; no rashes Neuro: normal speech, no focal facial asymmetry Psych: alert and oriented x3, normal mood and affect   Data Reviewed/Medical Decision Making:  Independent interpretation of tests: Imaging:  Review of patient's CT Chest October 2025 images revealed mosaic attenuation, peribronchial thickening. The patient's images have been independently reviewed by me.    PFTs: I have personally reviewed the patient's PFTs and normal pulmonary function    Latest Ref Rng & Units 10/15/2014  1:06 PM  PFT Results  FVC-Pre L 2.71   FVC-Predicted Pre % 68   FVC-Post L 3.04   FVC-Predicted Post % 76   Pre FEV1/FVC % % 72   Post FEV1/FCV % % 77   FEV1-Pre L 1.96   FEV1-Predicted Pre % 62   FEV1-Post L 2.34   DLCO uncorrected ml/min/mmHg 24.69   DLCO UNC% % 84   DLVA Predicted % 103   TLC L 4.96   TLC % Predicted % 88   RV % Predicted % 109     Labs: absolute eosinophil count 700 in in 2022  Immunization status:  Immunization History  Administered Date(s) Administered   Influenza, Seasonal, Injecte, Preservative Fre 05/28/2013, 03/29/2023, 03/20/2024   Influenza,inj,Quad PF,6+ Mos 06/07/2017, 04/16/2018   PNEUMOCOCCAL CONJUGATE-20 12/20/2023   Pneumococcal Conjugate-13 11/29/2017   Td 06/28/2011   Tdap 06/29/2011, 12/20/2023    Assessment and Plan Assessment & Plan Poorly controlled asthma Asthma poorly controlled with increased exertional dyspnea. Current inhaler regimen inadequate. CT shows air trapping, peribronchial thickening, mosaic attenuation. No smoking-related lung disease or scarring. Symptoms possibly  exacerbated by seasonal changes, stress, and reflux. - Switched to Advair dry powder inhaler, one puff in the morning and one at night. - Instructed to gargle after using Advair to prevent thrush. - Use albuterol  inhaler more frequently as needed for symptoms. - Use nebulizer at home for better medication delivery. - Will consider Tezspire  if symptoms persist after two months on Advair. - Feno today 50 ppb which is elevated, consistent with ongoing allergic inflammation.   Allergic rhinitis Nasal drainage and obstruction likely contribute to coughing. Allergies may exacerbate asthma. - Continue using Astelin  nasal spray as needed.  Gastroesophageal reflux disease (GERD) GERD managed with pantoprazole . Reflux may contribute to asthma symptoms. - Continue pantoprazole  daily.    Return to Care: Return in about 6 months (around 11/03/2024) for BW or TP.  Verdon Gore, MD Pulmonary and Critical Care Medicine [facility] Office:[phone]  CC: Jolinda Potter [place], [place]

## 2024-05-06 NOTE — Patient Instructions (Signed)
 It was a pleasure to see you today!  Please schedule follow up with [facility] in 6 months.  If my schedule is not open yet, we will contact you with a reminder closer to that time. Please call [phone] if you haven't heard from us  a month before, and always call us  sooner if issues or concerns arise. You can also send us  a message through MyChart, but but aware that this is not to be used for urgent issues and it may take up to 5-7 days to receive a reply. Please be aware that you will likely be able to view your results before I have a chance to respond to them. Please give us  5 business days to respond to any non-urgent results.    VISIT SUMMARY: During your visit, we discussed your worsening shortness of breath and cough, which have been particularly troublesome over the past several months. We reviewed your current medications and made some adjustments to better manage your asthma and related symptoms.  YOUR PLAN: -POORLY CONTROLLED ASTHMA: Your asthma is not well controlled, leading to increased shortness of breath during physical activity. We have switched your inhaler to Advair, which you should use one puff in the morning and one at night. Remember to gargle after using Advair to prevent thrush. You should also use your albuterol  inhaler more frequently as needed and use your nebulizer at home for better medication delivery. If your symptoms do not improve after two months on Advair, we may consider switching to Tezspire . We also performed a breathing test to check for allergic inflammation.  -ALLERGIC RHINITIS: Allergic rhinitis is inflammation of the nasal passages, which can cause nasal drainage and obstruction, contributing to your cough. Continue using Astelin  nasal spray as needed to manage these symptoms.  -GASTROESOPHAGEAL REFLUX DISEASE (GERD): GERD is a condition where stomach acid frequently flows back into the tube connecting your mouth and stomach, which can worsen asthma symptoms.  Continue taking pantoprazole  daily to manage your reflux.  INSTRUCTIONS: Please follow up in two months with Dr. Iva to assess how well the new inhaler regimen is working. If your symptoms persist or worsen, contact our office sooner. We'll see you back in 6 months.

## 2024-05-08 DIAGNOSIS — G894 Chronic pain syndrome: Secondary | ICD-10-CM | POA: Diagnosis not present

## 2024-05-08 DIAGNOSIS — G5602 Carpal tunnel syndrome, left upper limb: Secondary | ICD-10-CM | POA: Diagnosis not present

## 2024-05-08 DIAGNOSIS — Z981 Arthrodesis status: Secondary | ICD-10-CM | POA: Diagnosis not present

## 2024-05-08 DIAGNOSIS — M545 Low back pain, unspecified: Secondary | ICD-10-CM | POA: Diagnosis not present

## 2024-05-16 ENCOUNTER — Encounter: Payer: Self-pay | Admitting: Allergy & Immunology

## 2024-05-16 NOTE — Telephone Encounter (Signed)
 Is it okay to send in refills of Breztri ?

## 2024-05-17 MED ORDER — BREZTRI AEROSPHERE 160-9-4.8 MCG/ACT IN AERO: 2.0000 | INHALATION_SPRAY | Freq: Two times a day (BID) | RESPIRATORY_TRACT | 5 refills | Status: AC

## 2024-05-27 ENCOUNTER — Encounter: Payer: Self-pay | Admitting: Family Medicine

## 2024-05-28 ENCOUNTER — Encounter: Payer: Self-pay | Admitting: Allergy & Immunology

## 2024-05-28 ENCOUNTER — Ambulatory Visit (INDEPENDENT_AMBULATORY_CARE_PROVIDER_SITE_OTHER): Admitting: Family Medicine

## 2024-05-28 ENCOUNTER — Encounter: Payer: Self-pay | Admitting: Family Medicine

## 2024-05-28 VITALS — BP 124/76 | HR 61 | Temp 97.5°F | Ht 67.0 in | Wt 145.1 lb

## 2024-05-28 DIAGNOSIS — N8110 Cystocele, unspecified: Secondary | ICD-10-CM

## 2024-05-28 DIAGNOSIS — Z90711 Acquired absence of uterus with remaining cervical stump: Secondary | ICD-10-CM

## 2024-05-28 NOTE — Progress Notes (Signed)
 Subjective: RR:cjhpwjo bulge PCP: Jolinda Norene HERO, DO YEP:Rjuyb W Pickerel is a 63 y.o. female presenting to clinic today for:  Reports last week that she was coughing a lot and developed a little bit of low back pain left greater than right.  It seemed to be hurting more when she stood and when she wiped she felt a bulge in the vaginal area.  She is worried that her bladder has dropped.  She has history of partial hysterectomy that was performed by Dr. Edsel many years ago.  She has had vaginal births in the past.  She is not sexually active, as her partner is recently deceased.  She denies any dysuria, hematuria, urinary frequency, incontinence or leakage.  No bowel problems reported.  No bleeding   ROS: Per HPI  No Known Allergies Past Medical History:  Diagnosis Date   Arthritis    Spine, R knee   Asthma    Back pain    Rod in back   GERD (gastroesophageal reflux disease)    History of blood transfusion    Back surgery   hx Cough 09/03/2014   Followed in Pulmonary clinic/ [facility]/ [facility] - sinus CT 09/08/2014> Mild mucosal thickening involves the paranasal sinuses  - Eos 1.2  08/06/14 - Allergy  profile 11/27/14 >  Eos 1.0/ IgE  41 with neg RAST     Mixed hyperlipidemia 08/24/2022    Current Outpatient Medications:    acetaminophen  (TYLENOL ) 500 MG tablet, Take 1,000 mg by mouth every 8 (eight) hours as needed for moderate pain., Disp: , Rfl:    albuterol  (VENTOLIN  HFA) 108 (90 Base) MCG/ACT inhaler, INHALE 2 PUFFS into THE lungs EVERY 4 HOURS AS NEEDED FOR wheezing OR SHORTNESS OF BREATH, Disp: 8.5 g, Rfl: 2   azelastine  (ASTELIN ) 0.1 % nasal spray, INSTILL 2 SPRAYS IN EACH NOSTRIL TWICE DAILY AS DIRECTED, Disp: 30 mL, Rfl: 5   budesonide -glycopyrrolate -formoterol  (BREZTRI  AEROSPHERE) 160-9-4.8 MCG/ACT AERO inhaler, Inhale 2 puffs into the lungs in the morning and at bedtime., Disp: 1 each, Rfl: 5   celecoxib  (CELEBREX ) 200 MG capsule, Take by mouth 2 (two) times daily.,  Disp: , Rfl:    cetirizine  (ZYRTEC ) 10 MG tablet, Take 1 tablet (10 mg total) by mouth daily., Disp: 90 tablet, Rfl: 4   EPINEPHrine  0.3 mg/0.3 mL IJ SOAJ injection, Inject 0.3 mg into the muscle as needed for anaphylaxis., Disp: , Rfl:    fluticasone  (FLONASE ) 50 MCG/ACT nasal spray, INSTILL 2 SPRAYS IN EACH NOSTRIL EVERY MORNING AND AT BEDTIME, Disp: 16 g, Rfl: 5   fluticasone -salmeterol (ADVAIR) 500-50 MCG/ACT AEPB, Inhale 1 puff into the lungs in the morning and at bedtime., Disp: 60 each, Rfl: 5   hydrOXYzine  (ATARAX /VISTARIL ) 50 MG tablet, TAKE 1/2 TO 1 TABLET BY MOUTH EVERY 8 HOURS AS NEEDED FOR ANXIETY, Disp: 30 tablet, Rfl: prn   montelukast  (SINGULAIR ) 10 MG tablet, TAKE 1 TABLET BY MOUTH AT BEDTIME, Disp: 30 tablet, Rfl: 3   morphine  (MSIR) 15 MG tablet, Take 15 mg by mouth every 8 (eight) hours as needed. Back Pain, Disp: , Rfl:    Multiple Vitamins-Minerals (MULTIVITAMIN WITH MINERALS) tablet, Take 1 tablet by mouth daily., Disp: , Rfl:    pantoprazole  (PROTONIX ) 40 MG tablet, Take 1 tablet (40 mg total) by mouth daily., Disp: 90 tablet, Rfl: 4   predniSONE  (STERAPRED UNI-PAK 21 TAB) 10 MG (21) TBPK tablet, As directed x 6 days, Disp: 21 tablet, Rfl: 0   Propylene Glycol (SYSTANE BALANCE)  0.6 % SOLN, Place 1 drop into both eyes as needed (dry eyes)., Disp: , Rfl:    tiZANidine  (ZANAFLEX ) 4 MG tablet, Take 1 tablet (4 mg total) by mouth every 8 (eight) hours as needed for muscle spasms., Disp: 30 tablet, Rfl: 0   zolpidem  (AMBIEN  CR) 6.25 MG CR tablet, Take 1 tablet (6.25 mg total) by mouth at bedtime as needed for sleep. (Patient not taking: Reported on 05/06/2024), Disp: 15 tablet, Rfl: 2 Social History   Socioeconomic History   Marital status: Widowed    Spouse name: Daril   Number of children: 2   Years of education: 12   Highest education level: Not on file  Occupational History   Occupation: disabled    Comment: back  Tobacco Use   Smoking status: Never   Smokeless  tobacco: Never  Vaping Use   Vaping status: Never Used  Substance and Sexual Activity   Alcohol use: No   Drug use: No   Sexual activity: Yes    Birth control/protection: Surgical  Other Topics Concern   Not on file  Social History Narrative   Spouse Daril of 34 years passed away   Two sons/ grandchildren   Disabled from back pain/had scoliosis. Has had back reconstruction.    Dr. Velton doctor.    Social Drivers of Corporate Investment Banker Strain: Low Risk  (01/01/2024)   Overall Financial Resource Strain (CARDIA)    Difficulty of Paying Living Expenses: Not very hard  Food Insecurity: No Food Insecurity (01/01/2024)   Hunger Vital Sign    Worried About Running Out of Food in the Last Year: Never true    Ran Out of Food in the Last Year: Never true  Transportation Needs: No Transportation Needs (01/01/2024)   PRAPARE - Administrator, Civil Service (Medical): No    Lack of Transportation (Non-Medical): No  Physical Activity: Sufficiently Active (10/11/2022)   Exercise Vital Sign    Days of Exercise per Week: 3 days    Minutes of Exercise per Session: 60 min  Stress: Stress Concern Present (01/01/2024)   [employer] of Occupational Health - Occupational Stress Questionnaire    Feeling of Stress: Rather much  Social Connections: Moderately Isolated (01/01/2024)   Social Connection and Isolation Panel    Frequency of Communication with Friends and Family: Three times a week    Frequency of Social Gatherings with Friends and Family: Three times a week    Attends Religious Services: More than 4 times per year    Active Member of Clubs or Organizations: No    Attends Banker Meetings: Never    Marital Status: Divorced  Catering Manager Violence: Not At Risk (01/01/2024)   Humiliation, Afraid, Rape, and Kick questionnaire    Fear of Current or Ex-Partner: No    Emotionally Abused: No    Physically Abused: No    Sexually Abused: No   Family History   Problem Relation Age of Onset   Emphysema Maternal Grandmother        never smoker   COPD Maternal Grandmother    Emphysema Maternal Uncle        smoked   COPD Maternal Uncle    Arthritis Mother    Diabetes Mother    Hyperlipidemia Mother    Hypertension Mother    Early death Father 10       MVA   Asthma Sister    Asthma Brother    Heart disease Maternal  Grandfather     Objective: Office vital signs reviewed. BP 124/76   Pulse 61   Temp (!) 97.5 F (36.4 C)   Ht 5' 7 (1.702 m)   Wt 145 lb 2 oz (65.8 kg)   LMP 08/30/2002 (Approximate)   SpO2 92%   BMI 22.73 kg/m   Physical Examination:  General: Awake, alert, toxic appearing female.  No acute distress GU: external vaginal tissue atrophic, cervix surgically absent, no vaginal discharge or bleeding, I could not appreciate any cystocele or rectocele's but she does have a slight bulge palpable on the anatomic right side of the vaginal canal    Assessment/ Plan: 63 y.o. female   Vaginal prolapse - Plan: Ambulatory referral to Obstetrics / Gynecology  History of partial hysterectomy - Plan: Ambulatory referral to Obstetrics / Gynecology   ?  Partial vaginal wall prolapse on the right.  [facility] but requesting a female provider as Dr. Edsel has retired.  She is not exhibiting any red flag signs or symptoms.  Agree that control of her cough with likely reduce risk of progression.   Norene CHRISTELLA Fielding, DO [facility] [phone]

## 2024-05-28 NOTE — Patient Instructions (Signed)
 Pelvic Organs That Bulge or Drop (Pelvic Organ Prolapse): What to Know  Pelvic organ prolapse is when organs in the pelvis stretch, bulge, or drop into a position that isn't normal. It happens when the muscles and tissues that support the pelvic organs become weak or stretched. There're several types of pelvic organ prolapse. They are: Prolapse of the vagina. Prolapse of the uterus. Prolapse of the bladder. Prolapse of the rectum (rectocele). Prolapse of the intestines (enterocele). When organs other than the vagina are involved, they often bulge into the vagina or stick out from the vagina. How bad the prolapse is depends on how much the organs stick out. What are the causes? Pregnancy, labor, and giving birth to a baby. Past pelvic floor injury or surgery. Menopause. This is a stage in life when a female no longer has a period. Genetic diseases that weaken muscles and tissue. Having obesity. Long-term trouble pooping (chronic constipation). Hysterectomy. This is when the uterus is taken out. Smoking, lung disease, and long-term cough. Heavy lifting. Heavy exercises that put stress on bones and joints. What are the signs or symptoms? Leaking a little pee when you cough, sneeze, strain, or exercise. This is called stress incontinence. This may be worse right after giving birth to a baby. It may get better over time. Feeling pressure in your pelvis or vagina. You may feel more pressure when you cough or poop. A bulge that sticks out from the opening of your vagina or in the vagina. Trouble peeing or pooping. Pain in your lower back. Pain, discomfort, or leaking pee during sex. Having more than one bladder infection, also called urinary tract infection. Some people have no symptoms. How is this diagnosed? A prolapse may be diagnosed based on an exam of the vagina or the butt. During the exam, you may be asked to cough and strain while you are lying down, sitting, and standing up.   Bladder function tests may also be done. How is this treated? Treatment for pelvic organ prolapse may depend on your symptoms. Treatment may include: Lifestyle changes, such as drinking plenty of fluids and eating foods that are high in fiber. This makes it easier to poop. Peeing at specific times. This is called bladder training. This can help if you leak pee. Estrogen. This may help to make your pelvic floor muscles strong. Exercises. Kegels may help to make the muscles of the pelvic floor strong and tight. Yoga and Pilates can make the muscles of the belly strong. Biofeedback. This is doing physical therapy and using a device to help you tighten the muscles of the pelvic floor. Pelvic floor stimulators. These devices help strengthen the pelvic floor muscles if you can't do Kegels. Pessary. This is a soft, flexible device that helps support the walls of the vagina. It keeps the pelvic organs in place. Surgery. This may be needed to treat really bad prolapse. Follow these instructions at home: Activity Lose weight as told. Avoid heavy lifting and straining when you exercise or work. Do not hold your breath when you exercise or lift. Limit your activities as told. Do Kegel exercises as told. General instructions Take your medicines as told. Wear a pad or adult diapers if you leak pee. If you have a pessary, take care of it as told. Contact a health care provider if: You have symptoms that get in the way of your daily activities or sex life. You have bleeding from your vagina, and the bleeding isn't from a period. You have  a fever, bad smelling discharge from your vagina, or other signs of infection. You have pain or bleeding when you pee. You have trouble pooping. You have a pessary that falls out. You have a new, low pain in your belly. Get help right away if: You can't pee. This information is not intended to replace advice given to you by your health care provider. Make sure you  discuss any questions you have with your health care provider. Document Revised: 04/24/2023 Document Reviewed: 04/24/2023 Elsevier Patient Education  2025 [employer].

## 2024-05-31 ENCOUNTER — Telehealth: Payer: Self-pay

## 2024-05-31 NOTE — Telephone Encounter (Signed)
 Called the patient and scheduled fasenra  sample

## 2024-06-01 ENCOUNTER — Encounter: Payer: Self-pay | Admitting: Family Medicine

## 2024-06-01 DIAGNOSIS — F410 Panic disorder [episodic paroxysmal anxiety] without agoraphobia: Secondary | ICD-10-CM

## 2024-06-03 ENCOUNTER — Telehealth: Payer: Self-pay | Admitting: *Deleted

## 2024-06-03 MED ORDER — HYDROXYZINE HCL 50 MG PO TABS: 25.0000 mg | ORAL_TABLET | Freq: Three times a day (TID) | ORAL | 99 refills | Status: AC | PRN

## 2024-06-03 NOTE — Telephone Encounter (Signed)
 Tried to reach patient X2 someone picked up but did not answer. Sent mychart message to patient needs MD visit and CBC for Ins approval for Fasenra 

## 2024-06-03 NOTE — Telephone Encounter (Signed)
 Will call patient she will need CBC w/diff before starting Fasenra 

## 2024-06-04 NOTE — Telephone Encounter (Signed)
 Patient is scheduled with Arlean to have bloodwork ordered.

## 2024-06-05 ENCOUNTER — Ambulatory Visit

## 2024-06-05 ENCOUNTER — Encounter: Payer: Self-pay | Admitting: Family Medicine

## 2024-06-05 ENCOUNTER — Other Ambulatory Visit: Payer: Self-pay

## 2024-06-05 ENCOUNTER — Ambulatory Visit: Admitting: Family Medicine

## 2024-06-05 VITALS — BP 130/82 | HR 74 | Temp 98.2°F

## 2024-06-05 DIAGNOSIS — J302 Other seasonal allergic rhinitis: Secondary | ICD-10-CM

## 2024-06-05 DIAGNOSIS — J3089 Other allergic rhinitis: Secondary | ICD-10-CM | POA: Diagnosis not present

## 2024-06-05 DIAGNOSIS — J4550 Severe persistent asthma, uncomplicated: Secondary | ICD-10-CM | POA: Diagnosis not present

## 2024-06-05 DIAGNOSIS — K219 Gastro-esophageal reflux disease without esophagitis: Secondary | ICD-10-CM | POA: Diagnosis not present

## 2024-06-05 MED ORDER — BUDESONIDE 0.5 MG/2ML IN SUSP: RESPIRATORY_TRACT | 1 refills | Status: DC

## 2024-06-05 MED ORDER — PREDNISONE 10 MG PO TABS: ORAL_TABLET | ORAL | 0 refills | Status: AC

## 2024-06-05 MED ORDER — CETIRIZINE HCL 10 MG PO TABS: 10.0000 mg | ORAL_TABLET | Freq: Every day | ORAL | 4 refills | Status: AC

## 2024-06-05 NOTE — Addendum Note (Signed)
 Addended by: Mikena Masoner M on: 06/05/2024 02:49 PM   Modules accepted: Orders

## 2024-06-05 NOTE — Progress Notes (Cosign Needed Addendum)
 [address] [place] [address] Dept: [phone]  FOLLOW UP NOTE  Patient ID: [name], female    DOB: 1960-07-25  Age: 63 y.o. MRN: [id number] Date of Office Visit: 06/05/2024  Assessment  Chief Complaint: Asthma (Not controlled), Follow-up, and Breathing Problem  HPI [name] is a 63 year old female who presents to the clinic for a follow up visit. She was last seen in this clinic on 12/27/2023 by Dr Iva for evaluation of asthma, allergic rhintiis, back pain, and reflux.   Discussed the use of AI scribe software for clinical note transcription with the patient, who gave verbal consent to proceed.  History of Present Illness [name] is a 63 year old female with chronic respiratory issues who presents for lab work to manage her breathing difficulties.  She experiences daily shortness of breath, which worsens with activity, and walking even short distances causes her oxygen levels to drop. She has wheezing throughout the day and a daily cough that produces clear phlegm. She uses her albuterol  rescue inhaler three to four times a day and undergoes multiple daily breathing treatments, though she is unsure of the medication used in these treatments. A call to her pharmacy reveals a prescription for albuterol  via nebulizer. Of note, she has previously tried Nucala , Fasenra , and Tezspire .   She is currently using Breztri  inhaler 2 puffs twice a day with a spacer. The only treatment that previously improved her symptoms was the Fasenra  shot, although it caused knee pain.  She has a history of using Flonase  and cetirizine  (Zyrtec ) for allergy  symptoms, but these medications have not been effective. She has not used Flonase  recently and has not had a refill of cetirizine  due to prescription issues. No current allergy  symptoms such as runny or stuffy nose.  No reflux symptoms, but she continues to take pantoprazole  daily.  Her current medications are listed in  the chart.  Drug Allergies:  No Known Allergies  Physical Exam: BP 130/82 (BP Location: Right Arm, Patient Position: Sitting, Cuff Size: Normal)   Pulse 74   Temp 98.2 F (36.8 C) (Temporal)   LMP 08/30/2002 (Approximate)   SpO2 90%    Physical Exam Vitals reviewed.  Constitutional:      Appearance: Normal appearance.  HENT:     Head: Normocephalic and atraumatic.     Right Ear: Tympanic membrane normal.     Left Ear: Tympanic membrane normal.     Nose:     Comments: Bilateral nares normal. Pahrynx normal. Ears normal. Eyes normal.    Mouth/Throat:     Pharynx: Oropharynx is clear.  Eyes:     Conjunctiva/sclera: Conjunctivae normal.  Cardiovascular:     Rate and Rhythm: Normal rate and regular rhythm.     Heart sounds: Normal heart sounds. No murmur heard. Pulmonary:     Effort: Pulmonary effort is normal.     Breath sounds: Normal breath sounds.     Comments: Lungs clear to auscultation Musculoskeletal:        General: Normal range of motion.     Cervical back: Normal range of motion and neck supple.  Skin:    General: Skin is warm and dry.  Neurological:     Mental Status: She is alert and oriented to person, place, and time.  Psychiatric:        Mood and Affect: Mood normal.        Behavior: Behavior normal.  Thought Content: Thought content normal.        Judgment: Judgment normal.     Diagnostics: FVC 1.41 which is 43% of predicted value, FEV1 1.14 which is 44% of predicted value.  Spirometry indicates possible restriction.  Assessment and Plan: 1. Severe persistent asthma without complication (HCC)   2. Seasonal and perennial allergic rhinitis   3. Gastroesophageal reflux disease, unspecified whether esophagitis present     Meds ordered this encounter  Medications   budesonide  (PULMICORT ) 0.5 MG/2ML nebulizer solution    Sig: For now and for asthma flare, begin budesonide  via nebulizer for 1-2 weeks or until cough and wheeze free, then stop     Dispense:  75 mL    Refill:  1   cetirizine  (ZYRTEC ) 10 MG tablet    Sig: Take 1 tablet (10 mg total) by mouth daily.    Dispense:  90 tablet    Refill:  4   predniSONE  (DELTASONE ) 10 MG tablet    Sig: Begin prednisone  10 mg tablets. Take 2 tablets twice a day for 3 days, then take 2 tablets once a day for 1 day, then take 1 tablet on the 5th day, then stop    Dispense:  15 tablet    Refill:  0    Patient Instructions  Asthma Begin prednisone  10 mg tablets. Take 2 tablets twice a day for 3 days, then take 2 tablets once a day for 1 day, then take 1 tablet on the 5th day, then stop. Get the lab work before starting prednisone  Continue albuterol  2 puffs once every 4 hours if needed for cough or wheeze You may use albuterol  2 puffs 5-15 minutes before activity to decrease cough or wheeze  Continue Breztri  2 puffs twice a day with a spacer to prevent cough or wheeze Begin budesonide  via nebulizer twice a day for the next 2 weeks or until cough and wheeze free Lab work has been ordered to help us  evaluate your asthma. We will call you when the results become available  Allergic rhinitis Continue allergen avoidance measures directed toward mold, cat, dog, and cockroach as listed below Continue cetirizine  10 mg once a day if needed for a runny nose or itch Continue Flonase  2 sprays in each nostril once a day if needed for a stuffy nose.  In the right nostril, point the applicator out toward the right ear. In the left nostril, point the applicator out toward the left ear Consider saline nasal rinses as needed for nasal symptoms. Use this before any medicated nasal sprays for best result Continue Azelastine  2 sprays in each nostril once or twice a day if needed for nasal symptoms. Consider allergen immunotherapy if your symptoms do not improve with the treatment plan as listed above  Reflux Continue dietary and lifestyle modifications as listed below  Call the clinic if this treatment plan is  not working well for you  Follow up in 1 month or sooner if needed.  Return in about 4 weeks (around 07/03/2024), or if symptoms worsen or fail to improve.    Thank you for the opportunity to care for this patient.  Please do not hesitate to contact me with questions.  Arlean Mutter, FNP [facility] 

## 2024-06-05 NOTE — Patient Instructions (Addendum)
 Asthma Begin prednisone  10 mg tablets. Take 2 tablets twice a day for 3 days, then take 2 tablets once a day for 1 day, then take 1 tablet on the 5th day, then stop. Get the lab work before starting prednisone  Continue albuterol  2 puffs once every 4 hours if needed for cough or wheeze You may use albuterol  2 puffs 5-15 minutes before activity to decrease cough or wheeze  Continue Breztri  2 puffs twice a day with a spacer to prevent cough or wheeze Begin budesonide  via nebulizer twice a day for the next 2 weeks or until cough and wheeze free Lab work has been ordered to help us  evaluate your asthma. We will call you when the results become available  Allergic rhinitis Continue allergen avoidance measures directed toward mold, cat, dog, and cockroach as listed below Continue cetirizine  10 mg once a day if needed for a runny nose or itch Continue Flonase  2 sprays in each nostril once a day if needed for a stuffy nose.  In the right nostril, point the applicator out toward the right ear. In the left nostril, point the applicator out toward the left ear Consider saline nasal rinses as needed for nasal symptoms. Use this before any medicated nasal sprays for best result Continue Azelastine  2 sprays in each nostril once or twice a day if needed for nasal symptoms. Consider allergen immunotherapy if your symptoms do not improve with the treatment plan as listed above  Reflux Continue dietary and lifestyle modifications as listed below  Call the clinic if this treatment plan is not working well for you  Follow up in 1 month or sooner if needed.  Control of Mold Allergen Mold and fungi can grow on a variety of surfaces provided certain temperature and moisture conditions exist.  Outdoor molds grow on plants, decaying vegetation and soil.  The major outdoor mold, Alternaria and Cladosporium, are found in very high numbers during hot and dry conditions.  Generally, a late Summer - Fall peak is seen for  common outdoor fungal spores.  Rain will temporarily lower outdoor mold spore count, but counts rise rapidly when the rainy period ends.  The most important indoor molds are Aspergillus and Penicillium.  Dark, humid and poorly ventilated basements are ideal sites for mold growth.  The next most common sites of mold growth are the bathroom and the kitchen.  Outdoor [employer] Use air conditioning and keep windows closed Avoid exposure to decaying vegetation. Avoid leaf raking. Avoid grain handling. Consider wearing a face mask if working in moldy areas.  Indoor Mold Control Maintain humidity below 50%. Clean washable surfaces with 5% bleach solution. Remove sources e.g. Contaminated carpets.  Control of Dog or Cat Allergen Avoidance is the best way to manage a dog or cat allergy . If you have a dog or cat and are allergic to dog or cats, consider removing the dog or cat from the home. If you have a dog or cat but dont want to find it a new home, or if your family wants a pet even though someone in the household is allergic, here are some strategies that may help keep symptoms at bay:  Keep the pet out of your bedroom and restrict it to only a few rooms. Be advised that keeping the dog or cat in only one room will not limit the allergens to that room. Dont pet, hug or kiss the dog or cat; if you do, wash your hands with soap and water . High-efficiency particulate  air (HEPA) cleaners run continuously in a bedroom or living room can reduce allergen levels over time. Regular use of a high-efficiency vacuum cleaner or a central vacuum can reduce allergen levels. Giving your dog or cat a bath at least once a week can reduce airborne allergen.  Control of Cockroach Allergen Cockroach allergen has been identified as an important cause of acute attacks of asthma, especially in urban settings.  There are fifty-five species of cockroach that exist in the [place] , however only three, the  American, German and Oriental species produce allergen that can affect patients with Asthma.  Allergens can be obtained from fecal particles, egg casings and secretions from cockroaches.    Remove food sources. Reduce access to water . Seal access and entry points. Spray runways with 0.5-1% Diazinon or Chlorpyrifos Blow boric acid power under stoves and refrigerator. Place bait stations (hydramethylnon) at feeding sites.

## 2024-06-11 ENCOUNTER — Encounter: Payer: Self-pay | Admitting: Family Medicine

## 2024-06-13 ENCOUNTER — Other Ambulatory Visit

## 2024-06-14 ENCOUNTER — Ambulatory Visit: Payer: Self-pay | Admitting: Family Medicine

## 2024-06-14 LAB — CBC WITH DIFFERENTIAL/PLATELET
Basophils Absolute: 0.1 x10E3/uL (ref 0.0–0.2)
Basos: 1 %
EOS (ABSOLUTE): 0.8 x10E3/uL — ABNORMAL HIGH (ref 0.0–0.4)
Eos: 10 %
Hematocrit: 45.8 % (ref 34.0–46.6)
Hemoglobin: 14.9 g/dL (ref 11.1–15.9)
Immature Grans (Abs): 0 x10E3/uL (ref 0.0–0.1)
Immature Granulocytes: 0 %
Lymphocytes Absolute: 2.9 x10E3/uL (ref 0.7–3.1)
Lymphs: 35 %
MCH: 29.9 pg (ref 26.6–33.0)
MCHC: 32.5 g/dL (ref 31.5–35.7)
MCV: 92 fL (ref 79–97)
Monocytes Absolute: 0.7 x10E3/uL (ref 0.1–0.9)
Monocytes: 9 %
Neutrophils Absolute: 3.8 x10E3/uL (ref 1.4–7.0)
Neutrophils: 45 %
Platelets: 284 x10E3/uL (ref 150–450)
RBC: 4.99 x10E6/uL (ref 3.77–5.28)
RDW: 13 % (ref 11.7–15.4)
WBC: 8.4 x10E3/uL (ref 3.4–10.8)

## 2024-06-14 NOTE — Progress Notes (Signed)
 [name] Can you please submit for Fasenra  for asthma control. Thank you  [place], Can you please call this patient and let her know the lab work is in and we are submitting for Fasenra . Thank you

## 2024-06-18 ENCOUNTER — Telehealth: Payer: Self-pay

## 2024-06-18 ENCOUNTER — Other Ambulatory Visit (HOSPITAL_COMMUNITY): Payer: Self-pay

## 2024-06-18 MED ORDER — FASENRA PEN 30 MG/ML ~~LOC~~ SOAJ: 30.0000 mg | SUBCUTANEOUS | 2 refills | Status: DC

## 2024-06-18 NOTE — Telephone Encounter (Signed)
 Tried to contact patient to discuss Fasenra  but voicemail full and unable to leave message. Her claim today is $0 but could change Jan 2026

## 2024-06-18 NOTE — Telephone Encounter (Signed)
-----   Message from Arlean Mutter sent at 06/14/2024  8:36 AM EST ----- [name] Can you please submit for Fasenra  for asthma control. Thank you  [place], Can you please call this patient and let her know the lab work is in and we are submitting for Fasenra . Thank you

## 2024-06-18 NOTE — Telephone Encounter (Signed)
 Test claim for Fasenra  is showing at $0.00.   May change for 2026

## 2024-06-25 NOTE — Telephone Encounter (Signed)
L/m for patient again to contact me ?

## 2024-07-02 ENCOUNTER — Other Ambulatory Visit (HOSPITAL_COMMUNITY): Payer: Self-pay

## 2024-07-02 NOTE — Telephone Encounter (Signed)
 Patient called and I advised would have to request her OOp due to new year. Her copay is $12.10 so she has LIS l/m for patient to contact me to advise same and Darryle will reach out to her

## 2024-07-02 NOTE — Telephone Encounter (Signed)
 Patient contacted [name], 2026 copay so far showing $12.15

## 2024-07-02 NOTE — Addendum Note (Signed)
 Addended by: OTHA MADELIN HERO on: 07/02/2024 04:09 PM   Modules accepted: Orders

## 2024-07-10 ENCOUNTER — Other Ambulatory Visit (HOSPITAL_COMMUNITY): Payer: Self-pay

## 2024-07-10 ENCOUNTER — Other Ambulatory Visit: Payer: Self-pay

## 2024-07-10 ENCOUNTER — Encounter: Payer: Self-pay | Admitting: Allergy & Immunology

## 2024-07-10 ENCOUNTER — Ambulatory Visit: Admitting: Family Medicine

## 2024-07-10 VITALS — BP 110/80 | HR 79 | Temp 98.2°F

## 2024-07-10 DIAGNOSIS — K219 Gastro-esophageal reflux disease without esophagitis: Secondary | ICD-10-CM | POA: Diagnosis not present

## 2024-07-10 DIAGNOSIS — J302 Other seasonal allergic rhinitis: Secondary | ICD-10-CM | POA: Diagnosis not present

## 2024-07-10 DIAGNOSIS — J3089 Other allergic rhinitis: Secondary | ICD-10-CM

## 2024-07-10 DIAGNOSIS — J4550 Severe persistent asthma, uncomplicated: Secondary | ICD-10-CM | POA: Diagnosis not present

## 2024-07-10 MED ORDER — FASENRA PEN 30 MG/ML ~~LOC~~ SOAJ
30.0000 mg | SUBCUTANEOUS | 2 refills | Status: AC
  Filled 2024-07-11: qty 1, 28d supply, fill #0

## 2024-07-10 MED ORDER — BUDESONIDE 0.5 MG/2ML IN SUSP: RESPIRATORY_TRACT | 3 refills | Status: AC

## 2024-07-10 MED ORDER — FASENRA PEN 30 MG/ML ~~LOC~~ SOAJ
30.0000 mg | SUBCUTANEOUS | 6 refills | Status: AC
  Filled 2024-07-11: qty 1, 56d supply, fill #0

## 2024-07-10 NOTE — Patient Instructions (Addendum)
 Asthma Continue albuterol  2 puffs once every 4 hours if needed for cough or wheeze You may use albuterol  2 puffs 5-15 minutes before activity to decrease cough or wheeze  Continue Breztri  2 puffs twice a day with a spacer to prevent cough or wheeze For asthma flare, begin budesonide  via nebulizer twice a day for the next 2 weeks or until cough and wheeze free Begin Fasenra  when available. Continue to monitor for knww pain We can consider therapy with Exdensur (once every 6 months) when available  Allergic rhinitis Continue allergen avoidance measures directed toward mold, cat, dog, and cockroach as listed below Continue cetirizine  10 mg once a day if needed for a runny nose or itch Continue Flonase  2 sprays in each nostril once a day if needed for a stuffy nose.  In the right nostril, point the applicator out toward the right ear. In the left nostril, point the applicator out toward the left ear Consider saline nasal rinses as needed for nasal symptoms. Use this before any medicated nasal sprays for best result Continue Azelastine  2 sprays in each nostril once or twice a day if needed for nasal symptoms. Consider allergen immunotherapy if your symptoms do not improve with the treatment plan as listed above  Reflux Continue dietary and lifestyle modifications as listed below Continue Pantoprazole  40 mg once a day. Take this medication 30-60 minutes before your first meal for best results.   Call the clinic if this treatment plan is not working well for you  Follow up in 2 months or sooner if needed.  Control of Mold Allergen Mold and fungi can grow on a variety of surfaces provided certain temperature and moisture conditions exist.  Outdoor molds grow on plants, decaying vegetation and soil.  The major outdoor mold, Alternaria and Cladosporium, are found in very high numbers during hot and dry conditions.  Generally, a late Summer - Fall peak is seen for common outdoor fungal spores.  Rain  will temporarily lower outdoor mold spore count, but counts rise rapidly when the rainy period ends.  The most important indoor molds are Aspergillus and Penicillium.  Dark, humid and poorly ventilated basements are ideal sites for mold growth.  The next most common sites of mold growth are the bathroom and the kitchen.  Outdoor [employer] Use air conditioning and keep windows closed Avoid exposure to decaying vegetation. Avoid leaf raking. Avoid grain handling. Consider wearing a face mask if working in moldy areas.  Indoor Mold Control Maintain humidity below 50%. Clean washable surfaces with 5% bleach solution. Remove sources e.g. Contaminated carpets.  Control of Dog or Cat Allergen Avoidance is the best way to manage a dog or cat allergy . If you have a dog or cat and are allergic to dog or cats, consider removing the dog or cat from the home. If you have a dog or cat but dont want to find it a new home, or if your family wants a pet even though someone in the household is allergic, here are some strategies that may help keep symptoms at bay:  Keep the pet out of your bedroom and restrict it to only a few rooms. Be advised that keeping the dog or cat in only one room will not limit the allergens to that room. Dont pet, hug or kiss the dog or cat; if you do, wash your hands with soap and water . High-efficiency particulate air (HEPA) cleaners run continuously in a bedroom or living room can reduce allergen levels over  time. Regular use of a high-efficiency vacuum cleaner or a central vacuum can reduce allergen levels. Giving your dog or cat a bath at least once a week can reduce airborne allergen.  Control of Cockroach Allergen Cockroach allergen has been identified as an important cause of acute attacks of asthma, especially in urban settings.  There are fifty-five species of cockroach that exist in the [place] , however only three, the American, German and Oriental species  produce allergen that can affect patients with Asthma.  Allergens can be obtained from fecal particles, egg casings and secretions from cockroaches.    Remove food sources. Reduce access to water . Seal access and entry points. Spray runways with 0.5-1% Diazinon or Chlorpyrifos Blow boric acid power under stoves and refrigerator. Place bait stations (hydramethylnon) at feeding sites.  7

## 2024-07-10 NOTE — Telephone Encounter (Signed)
Thank you, [name]!

## 2024-07-10 NOTE — Progress Notes (Signed)
 "  [address] [place] [address] Dept: [phone]  FOLLOW UP NOTE  Patient ID: [name], female    DOB: Jan 01, 1961  Age: 64 y.o. MRN: [id number] Date of Office Visit: 07/10/2024  Assessment  Chief Complaint: Follow-up (Allergy  no concerns/Asthma no concerns) and Cough  HPI [name] is a 64 year old female who presents to the clinic for a follow-up visit.  She was last seen in this clinic on 06/05/2024 by Arlean Mutter, FNP, for evaluation of severe persistent asthma, allergic rhinitis, and reflux.  At that time, she required a prednisone  taper for asthma with acute exacerbation.  Discussed the use of AI scribe software for clinical note transcription with the patient, who gave verbal consent to proceed.  History of Present Illness [name] is a 64 year old female with asthma who presents with recurrent cough and elevated eosinophil count.  She has been experiencing a recurrent cough which she associates with her asthma.  At today's visit, she reports her asthma has been poorly controlled with symptoms including cough, shortness of breath, and wheeze. The cough is described as producing clear, bubbly mucus and occurs every morning. She uses albuterol  in the mornings to manage her symptoms, which helps break up the mucus. She coughs frequently throughout the day and night and anticipates wheezing soon, as she experienced similar symptoms for six months last year.  She has a history of joint pain following injectable biologic asthma medications. She has since had bilateral knee replacements due to osteoarthritis. Despite the knee replacements, she experienced knee pain again after trying Fasenra  and another medication, which also caused back pain. She speculates that the pain might be related to the artificial components in her body, such as the rods in her back. She has previously tried Fasenra , Nucala , and Tezspire  all with adverse reactions.   She recently resumed  using Breztri  2puffs twice a day after a trial with Symbicort , which worsened her breathing. She uses albuterol  via nebulizer on some mornings. Steroids helped clear her symptoms previously, but the relief was temporary.  For allergy  management, she uses azelastine  nasal spray and occasionally Flonase , along with Zyrtec  and Singulair . She reports occasional nasal symptoms which are controlled with medications. No reflux or recent illness. She tries to avoid sick individuals at work.  Her current medications are lsited in the chart.    Drug Allergies:  Allergies[1]  Physical Exam: BP 110/80   Pulse 79   Temp 98.2 F (36.8 C)   LMP 08/30/2002   SpO2 95%    Physical Exam Vitals reviewed.  Constitutional:      Appearance: Normal appearance.  HENT:     Head: Normocephalic and atraumatic.     Right Ear: Tympanic membrane normal.     Left Ear: Tympanic membrane normal.     Nose:     Comments: Bilateral nares normal.  Pharynx normal.  Ears normal.  Eyes normal.    Mouth/Throat:     Pharynx: Oropharynx is clear.  Eyes:     Conjunctiva/sclera: Conjunctivae normal.  Cardiovascular:     Rate and Rhythm: Normal rate and regular rhythm.     Heart sounds: Normal heart sounds. No murmur heard. Pulmonary:     Effort: Pulmonary effort is normal.     Breath sounds: Normal breath sounds.     Comments: Lungs clear to auscultation Musculoskeletal:        General: Normal range of motion.     Cervical back: Normal range  of motion and neck supple.  Skin:    General: Skin is warm and dry.  Neurological:     Mental Status: She is alert and oriented to person, place, and time.  Psychiatric:        Mood and Affect: Mood normal.        Behavior: Behavior normal.        Thought Content: Thought content normal.        Judgment: Judgment normal.    Diagnostics: FVC 2.43 which is 75% of predicted value. FEV1 1.84 which is 72% of predicted value. Spirometry indicates possible restriction.    Assessment and Plan: No diagnosis found.  Meds ordered this encounter  Medications   budesonide  (PULMICORT ) 0.5 MG/2ML nebulizer solution    Sig: For now and for asthma flare, begin budesonide  via nebulizer for 1-2 weeks or until cough and wheeze free, then stop    Dispense:  75 mL    Refill:  3    Patient Instructions  Asthma Continue albuterol  2 puffs once every 4 hours if needed for cough or wheeze You may use albuterol  2 puffs 5-15 minutes before activity to decrease cough or wheeze  Continue Breztri  2 puffs twice a day with a spacer to prevent cough or wheeze For asthma flare, begin budesonide  via nebulizer twice a day for the next 2 weeks or until cough and wheeze free Begin Fasenra  when available. Continue to monitor for knww pain We can consider therapy with Exdensur (once every 6 months) when available  Allergic rhinitis Continue allergen avoidance measures directed toward mold, cat, dog, and cockroach as listed below Continue cetirizine  10 mg once a day if needed for a runny nose or itch Continue Flonase  2 sprays in each nostril once a day if needed for a stuffy nose.  In the right nostril, point the applicator out toward the right ear. In the left nostril, point the applicator out toward the left ear Consider saline nasal rinses as needed for nasal symptoms. Use this before any medicated nasal sprays for best result Continue Azelastine  2 sprays in each nostril once or twice a day if needed for nasal symptoms. Consider allergen immunotherapy if your symptoms do not improve with the treatment plan as listed above  Reflux Continue dietary and lifestyle modifications as listed below Continue Pantoprazole  40 mg once a day. Take this medication 30-60 minutes before your first meal for best results.   Call the clinic if this treatment plan is not working well for you  Follow up in 2 months or sooner if needed.  Return in about 2 months (around 09/07/2024), or if symptoms  worsen or fail to improve.    Thank you for the opportunity to care for this patient.  Please do not hesitate to contact me with questions.  Arlean Mutter, FNP [facility]            [1] No Known Allergies  "

## 2024-07-10 NOTE — Addendum Note (Signed)
 Addended by: OTHA MADELIN HERO on: 07/10/2024 03:04 PM   Modules accepted: Orders

## 2024-07-10 NOTE — Telephone Encounter (Signed)
 Per Mychart message with patient went ahead and sent rx to [facility] to fill and will reach out once delivery set to make patient appt to start therapy in clinic

## 2024-07-11 ENCOUNTER — Other Ambulatory Visit (HOSPITAL_COMMUNITY): Payer: Self-pay

## 2024-07-11 ENCOUNTER — Encounter: Payer: Self-pay | Admitting: Family Medicine

## 2024-07-11 ENCOUNTER — Other Ambulatory Visit: Payer: Self-pay

## 2024-07-11 NOTE — Progress Notes (Signed)
 Specialty Pharmacy Initiation Note   [name] is a 64 y.o. female who will be followed by the specialty pharmacy service for RxSp Asthma/COPD    Review of administration, indication, effectiveness, safety, potential side effects, storage/disposable, and missed dose instructions occurred today for patient's specialty medication(s) Benralizumab  (Fasenra  Pen)     Patient/Caregiver did not have any additional questions or concerns.   Patient's therapy is appropriate to: Initiate    Goals Addressed             This Visit's Progress    Reduce disease symptoms including coughing and shortness of breath       Patient is restarting therapy. Previously she was experiencing good results but discontinued due to knee pain. After multiple courses of steroids to attempt to resolve breathing trouble it has been determined that patient will restart Fasenra . Patient will maintain adherence         Delon CHRISTELLA Brow Specialty Pharmacist

## 2024-07-11 NOTE — Progress Notes (Signed)
 Specialty Pharmacy Initial Fill Coordination Note  [name] is a 64 y.o. female contacted today regarding initial fill of specialty medication(s) Benralizumab  (Fasenra  Pen)   Patient requested Courier to Provider Office   Delivery date: 07/16/24   Verified address: [address] [place] [address]   Medication will be filled on: 07/15/24   Patient is aware of $12.65 copayment.

## 2024-07-12 ENCOUNTER — Encounter: Payer: Self-pay | Admitting: Family Medicine

## 2024-07-12 ENCOUNTER — Other Ambulatory Visit: Payer: Self-pay | Admitting: Family Medicine

## 2024-07-12 DIAGNOSIS — M4325 Fusion of spine, thoracolumbar region: Secondary | ICD-10-CM

## 2024-07-12 DIAGNOSIS — M170 Bilateral primary osteoarthritis of knee: Secondary | ICD-10-CM

## 2024-07-12 MED ORDER — CELECOXIB 200 MG PO CAPS: 200.0000 mg | ORAL_CAPSULE | Freq: Two times a day (BID) | ORAL | 3 refills | Status: AC | PRN

## 2024-07-15 ENCOUNTER — Other Ambulatory Visit: Payer: Self-pay

## 2024-07-17 ENCOUNTER — Telehealth: Payer: Self-pay | Admitting: Allergy & Immunology

## 2024-07-17 NOTE — Telephone Encounter (Signed)
 CREATED IN ERROR

## 2024-07-17 NOTE — Telephone Encounter (Signed)
 PT called to schedule Fasenra  shot for [facility] office (need to get injection from [place] to [facility])  I advised did not see shot in [place] office yet, but could get scheduled in [facility] for next week (appears like we should have shipment in today?) she said no, she didn't want to wait on hold for an hour in the parking lot of her job and that she would just call the [facility] office later

## 2024-07-19 ENCOUNTER — Ambulatory Visit

## 2024-07-19 DIAGNOSIS — J4550 Severe persistent asthma, uncomplicated: Secondary | ICD-10-CM

## 2024-07-19 MED ORDER — BENRALIZUMAB 30 MG/ML ~~LOC~~ SOSY
30.0000 mg | PREFILLED_SYRINGE | SUBCUTANEOUS | Status: AC
  Administered 2024-07-19: 30 mg via SUBCUTANEOUS

## 2024-07-19 NOTE — Progress Notes (Signed)
"                                                                        Immunotherapy   Patient Details  Name: [name] MRN: [id number] Date of Birth: May 19, 1961  07/19/2024  [name] started injections for  Fasenra  30mg  every 4 weeks. Consent signed and patient instructions given. Patient waited in office for 15 minutes.   [name] 07/19/2024, 10:23 AM   "

## 2024-07-26 ENCOUNTER — Encounter: Admitting: Obstetrics & Gynecology

## 2024-08-13 ENCOUNTER — Encounter: Admitting: Obstetrics & Gynecology

## 2024-08-16 ENCOUNTER — Ambulatory Visit

## 2024-09-11 ENCOUNTER — Ambulatory Visit: Admitting: Family Medicine

## 2025-01-01 ENCOUNTER — Ambulatory Visit: Payer: Self-pay
